# Patient Record
Sex: Female | Born: 1950
Health system: Southern US, Community
[De-identification: ages and names within clinical notes are randomized; demographics above are authoritative.]

## PROBLEM LIST (undated history)

## (undated) DIAGNOSIS — G4700 Insomnia, unspecified: Secondary | ICD-10-CM

## (undated) DIAGNOSIS — H409 Unspecified glaucoma: Secondary | ICD-10-CM

## (undated) DIAGNOSIS — Z8711 Personal history of peptic ulcer disease: Secondary | ICD-10-CM

## (undated) DIAGNOSIS — Z973 Presence of spectacles and contact lenses: Secondary | ICD-10-CM

## (undated) DIAGNOSIS — N183 Chronic kidney disease, stage 3 unspecified: Secondary | ICD-10-CM

## (undated) DIAGNOSIS — N200 Calculus of kidney: Secondary | ICD-10-CM

## (undated) DIAGNOSIS — K449 Diaphragmatic hernia without obstruction or gangrene: Secondary | ICD-10-CM

## (undated) DIAGNOSIS — I10 Essential (primary) hypertension: Secondary | ICD-10-CM

## (undated) DIAGNOSIS — I129 Hypertensive chronic kidney disease with stage 1 through stage 4 chronic kidney disease, or unspecified chronic kidney disease: Secondary | ICD-10-CM

## (undated) DIAGNOSIS — N3281 Overactive bladder: Secondary | ICD-10-CM

## (undated) DIAGNOSIS — K219 Gastro-esophageal reflux disease without esophagitis: Secondary | ICD-10-CM

## (undated) DIAGNOSIS — D509 Iron deficiency anemia, unspecified: Secondary | ICD-10-CM

## (undated) DIAGNOSIS — I671 Cerebral aneurysm, nonruptured: Secondary | ICD-10-CM

## (undated) DIAGNOSIS — N644 Mastodynia: Secondary | ICD-10-CM

## (undated) DIAGNOSIS — M810 Age-related osteoporosis without current pathological fracture: Secondary | ICD-10-CM

## (undated) DIAGNOSIS — Z972 Presence of dental prosthetic device (complete) (partial): Secondary | ICD-10-CM

## (undated) DIAGNOSIS — E039 Hypothyroidism, unspecified: Secondary | ICD-10-CM

## (undated) DIAGNOSIS — T7840XA Allergy, unspecified, initial encounter: Secondary | ICD-10-CM

## (undated) DIAGNOSIS — E079 Disorder of thyroid, unspecified: Secondary | ICD-10-CM

## (undated) DIAGNOSIS — R928 Other abnormal and inconclusive findings on diagnostic imaging of breast: Secondary | ICD-10-CM

## (undated) HISTORY — DX: Gastro-esophageal reflux disease without esophagitis: K21.9

## (undated) HISTORY — DX: Allergy, unspecified, initial encounter: T78.40XA

## (undated) HISTORY — DX: Essential (primary) hypertension: I10

## (undated) HISTORY — PX: COLONOSCOPY: SHX174

## (undated) HISTORY — DX: Disorder of thyroid, unspecified: E07.9

## (undated) HISTORY — PX: BRAIN SURGERY: SHX531

## (undated) HISTORY — DX: Age-related osteoporosis without current pathological fracture: M81.0

## (undated) HISTORY — PX: WISDOM TOOTH EXTRACTION: SHX21

---

## 1997-12-19 ENCOUNTER — Other Ambulatory Visit: Admission: RE | Admit: 1997-12-19 | Discharge: 1997-12-19 | Payer: Self-pay | Admitting: Obstetrics & Gynecology

## 1999-03-17 ENCOUNTER — Other Ambulatory Visit: Admission: RE | Admit: 1999-03-17 | Discharge: 1999-03-17 | Payer: Self-pay | Admitting: Obstetrics & Gynecology

## 1999-05-02 ENCOUNTER — Encounter: Admission: RE | Admit: 1999-05-02 | Discharge: 1999-05-02 | Payer: Self-pay | Admitting: Obstetrics & Gynecology

## 1999-05-02 ENCOUNTER — Encounter: Payer: Self-pay | Admitting: Obstetrics & Gynecology

## 2003-04-28 DIAGNOSIS — I671 Cerebral aneurysm, nonruptured: Secondary | ICD-10-CM

## 2003-04-28 HISTORY — DX: Cerebral aneurysm, nonruptured: I67.1

## 2004-03-27 DIAGNOSIS — Z8679 Personal history of other diseases of the circulatory system: Secondary | ICD-10-CM

## 2004-03-27 HISTORY — PX: CEREBRAL ANEURYSM REPAIR: SHX164

## 2004-03-27 HISTORY — DX: Personal history of other diseases of the circulatory system: Z86.79

## 2004-04-09 ENCOUNTER — Inpatient Hospital Stay (HOSPITAL_COMMUNITY): Admission: EM | Admit: 2004-04-09 | Discharge: 2004-05-01 | Payer: Self-pay | Admitting: Emergency Medicine

## 2004-05-21 ENCOUNTER — Observation Stay (HOSPITAL_COMMUNITY): Admission: EM | Admit: 2004-05-21 | Discharge: 2004-05-22 | Payer: Self-pay | Admitting: Emergency Medicine

## 2004-07-09 ENCOUNTER — Encounter: Admission: RE | Admit: 2004-07-09 | Discharge: 2004-10-07 | Payer: Self-pay | Admitting: Neurosurgery

## 2006-05-14 ENCOUNTER — Encounter: Admission: RE | Admit: 2006-05-14 | Discharge: 2006-05-14 | Payer: Self-pay | Admitting: Neurology

## 2013-04-03 ENCOUNTER — Ambulatory Visit (INDEPENDENT_AMBULATORY_CARE_PROVIDER_SITE_OTHER): Payer: Managed Care, Other (non HMO) | Admitting: Emergency Medicine

## 2013-04-03 VITALS — BP 118/76 | HR 64 | Temp 98.0°F | Resp 16 | Ht 64.0 in | Wt 202.0 lb

## 2013-04-03 DIAGNOSIS — J029 Acute pharyngitis, unspecified: Secondary | ICD-10-CM

## 2013-04-03 DIAGNOSIS — I10 Essential (primary) hypertension: Secondary | ICD-10-CM | POA: Insufficient documentation

## 2013-04-03 LAB — POCT CBC
Granulocyte percent: 68.7 %G (ref 37–80)
HCT, POC: 46.9 % (ref 37.7–47.9)
Hemoglobin: 14 g/dL (ref 12.2–16.2)
Lymph, poc: 2.2 (ref 0.6–3.4)
MCH, POC: 31.7 pg — AB (ref 27–31.2)
MCHC: 29.9 g/dL — AB (ref 31.8–35.4)
MCV: 106.1 fL — AB (ref 80–97)
MID (cbc): 0.8 (ref 0–0.9)
MPV: 9.6 fL (ref 0–99.8)
POC Granulocyte: 6.5 (ref 2–6.9)
POC LYMPH PERCENT: 22.7 %L (ref 10–50)
POC MID %: 8.6 %M (ref 0–12)
Platelet Count, POC: 264 10*3/uL (ref 142–424)
RBC: 4.42 M/uL (ref 4.04–5.48)
RDW, POC: 14.9 %
WBC: 9.5 10*3/uL (ref 4.6–10.2)

## 2013-04-03 LAB — POCT RAPID STREP A (OFFICE): Rapid Strep A Screen: NEGATIVE

## 2013-04-03 MED ORDER — FIRST-DUKES MOUTHWASH MT SUSP: OROMUCOSAL | Status: DC

## 2013-04-03 NOTE — Progress Notes (Addendum)
Subjective:    Patient ID: Rachel [name], female    DOB: 1950/10/03, 62 y.o.   MRN: [id number]  HPI This chart was scribed for Viviann Spare Lavaun Greenfield-MD, by Ladona Ridgel Day, Scribe. This patient was seen in room 12 and the patient's care was started at 12:12 PM.  HPI Comments: Rachel [name] is a 62 y.o. female who presents to the [facility] complaining of a constant, gradually worsened sore throat, onset last PM and worsened this AM when she woke up. She denies fever/chills but states that her throat feels scratchy. She reports her son at home w/similar sx last week (he had no cough/fever, just had a sore throat). She states took an advil w/out any improvement in her sx.  Medical Hx: HTN, osteoporosis, GERD She reports intake of seafood last PM (Shrimp, oysters and catfish, at [place]. Has had this many times before.), she states her sx began before eating though. She denies SOB, tongue/lip swelling.   Has been taking lisinopril for a a few years as well.  Past Medical History  Diagnosis Date  . GERD (gastroesophageal reflux disease)   . Hypertension   . Osteoporosis   . Thyroid disease    Past Surgical History  Procedure Laterality Date  . Brain surgery     Family History  Problem Relation Age of Onset  . Cancer Mother     Breast   . Hypertension Mother   . Cancer Father     Prostate  . Hypertension Father   . Cancer Sister     Breast   History   Social History  . Marital Status: Married    Spouse Name: N/A    Number of Children: N/A  . Years of Education: N/A   Occupational History  . Not on file.   Social History Main Topics  . Smoking status: Never Smoker   . Smokeless tobacco: Not on file  . Alcohol Use: No  . Drug Use: No  . Sexual Activity: Not on file   Other Topics Concern  . Not on file   Social History Narrative  . No narrative on file   Allergies  Allergen Reactions  . Dilantin [Phenytoin Sodium Extended] Rash   There are no  active problems to display for this patient.  Meds ordered this encounter  Medications  . levothyroxine (SYNTHROID, LEVOTHROID) 100 MCG tablet    Sig: Take 100 mcg by mouth daily before breakfast.  . hydrochlorothiazide (MICROZIDE) 12.5 MG capsule    Sig: Take 12.5 mg by mouth daily.  Marland Kitchen atorvastatin (LIPITOR) 20 MG tablet    Sig: Take 20 mg by mouth daily.  . pantoprazole (PROTONIX) 40 MG tablet    Sig: Take 40 mg by mouth daily.  Marland Kitchen lisinopril (PRINIVIL,ZESTRIL) 10 MG tablet    Sig: Take 10 mg by mouth daily.   Review of Systems  Constitutional: Negative for fever and chills.  HENT: Positive for sore throat. Negative for congestion and rhinorrhea.   Respiratory: Negative for cough and shortness of breath.   Cardiovascular: Negative for chest pain.  Gastrointestinal: Negative for nausea, vomiting, abdominal pain and diarrhea.  Skin: Negative for color change.   Triage Vitals: BP 118/76  Pulse 64  Temp(Src) 98 F (36.7 C) (Oral)  Resp 16  Ht 5\' 4"  (1.626 m)  Wt 202 lb (91.627 kg)  BMI 34.66 kg/m2  SpO2 94%     Objective:   Physical Exam Physical Exam  Nursing note and vitals  reviewed. Constitutional: Patient is oriented to person, place, and time. Patient appears well-developed and well-nourished. No distress.  HENT: TMs are clear nose is normal examination of the throat reveals edema present over the uvula and in the soft tissue adjacent to the uvula. The posterior pharynx appears normal. Her neck is supple without any stridor.  Head: Normocephalic and atraumatic.  Neck: Neck supple. No tracheal deviation present.  Cardiovascular: Normal rate, regular rhythm and normal heart sounds.   No murmur heard. Pulmonary/Chest: Effort normal and breath sounds normal. No respiratory distress. Patient has no wheezes. Patient has no rales.  Musculoskeletal: Normal range of motion.  Neurological: Patient is alert and oriented to person, place, and time.  Skin: Skin is warm and dry.    Psychiatric: Patient has a normal mood and affect. Patient's behavior is normal.  Results for orders placed in visit on 04/03/13  POCT RAPID STREP A (OFFICE)      Result Value Range   Rapid Strep A Screen Negative  Negative  POCT CBC      Result Value Range   WBC 9.5  4.6 - 10.2 K/uL   Lymph, poc 2.2  0.6 - 3.4   POC LYMPH PERCENT 22.7  10 - 50 %L   MID (cbc) 0.8  0 - 0.9   POC MID % 8.6  0 - 12 %M   POC Granulocyte 6.5  2 - 6.9   Granulocyte percent 68.7  37 - 80 %G   RBC 4.42  4.04 - 5.48 M/uL   Hemoglobin 14.0  12.2 - 16.2 g/dL   HCT, POC 16.1  09.6 - 47.9 %   MCV 106.1 (*) 80 - 97 fL   MCH, POC 31.7 (*) 27 - 31.2 pg   MCHC 29.9 (*) 31.8 - 35.4 g/dL   RDW, POC 04.5     Platelet Count, POC 264  142 - 424 K/uL   MPV 9.6  0 - 99.8 fL      Assessment & Plan:  The appearance of the posterior pharynx looks more allergic. She did have a seafood dinner last night. She also has had a strep exposure to her son I personally performed the services described in this documentation, which was scribed in my presence. The recorded information has been reviewed and is accurate.

## 2013-04-03 NOTE — Patient Instructions (Signed)
Take Zyrtec 10 mg one a day. Hold her lisinopril for now. Use your gargle as instructed.Sore Throat A sore throat is pain, burning, irritation, or scratchiness of the throat. There is often pain or tenderness when swallowing or talking. A sore throat may be accompanied by other symptoms, such as coughing, sneezing, fever, and swollen neck glands. A sore throat is often the first sign of another sickness, such as a cold, flu, strep throat, or mononucleosis (commonly known as mono). Most sore throats go away without medical treatment. CAUSES  The most common causes of a sore throat include:  A viral infection, such as a cold, flu, or mono.  A bacterial infection, such as strep throat, tonsillitis, or whooping cough.  Seasonal allergies.  Dryness in the air.  Irritants, such as smoke or pollution.  Gastroesophageal reflux disease (GERD). HOME CARE INSTRUCTIONS   Only take over-the-counter medicines as directed by your caregiver.  Drink enough fluids to keep your urine clear or pale yellow.  Rest as needed.  Try using throat sprays, lozenges, or sucking on hard candy to ease any pain (if older than 4 years or as directed).  Sip warm liquids, such as broth, herbal tea, or warm water with honey to relieve pain temporarily. You may also eat or drink cold or frozen liquids such as frozen ice pops.  Gargle with salt water (mix 1 tsp salt with 8 oz of water).  Do not smoke and avoid secondhand smoke.  Put a cool-mist humidifier in your bedroom at night to moisten the air. You can also turn on a hot shower and sit in the bathroom with the door closed for 5 10 minutes. SEEK IMMEDIATE MEDICAL CARE IF:  You have difficulty breathing.  You are unable to swallow fluids, soft foods, or your saliva.  You have increased swelling in the throat.  Your sore throat does not get better in 7 days.  You have nausea and vomiting.  You have a fever or persistent symptoms for more than 2 3  days.  You have a fever and your symptoms suddenly get worse. MAKE SURE YOU:   Understand these instructions.  Will watch your condition.  Will get help right away if you are not doing well or get worse. Document Released: 05/21/2004 Document Revised: 03/30/2012 Document Reviewed: 12/20/2011 [facility] Patient Information 2014 [place], [place].

## 2013-04-05 LAB — CULTURE, GROUP A STREP: Organism ID, Bacteria: NORMAL

## 2015-08-13 ENCOUNTER — Other Ambulatory Visit: Payer: Self-pay | Admitting: Internal Medicine

## 2015-08-13 DIAGNOSIS — R319 Hematuria, unspecified: Secondary | ICD-10-CM

## 2015-08-15 ENCOUNTER — Ambulatory Visit
Admission: RE | Admit: 2015-08-15 | Discharge: 2015-08-15 | Disposition: A | Discharge status: Home / Self Care | Payer: Managed Care, Other (non HMO) | Source: Ambulatory Visit | Attending: Internal Medicine | Admitting: Internal Medicine

## 2015-08-15 DIAGNOSIS — R319 Hematuria, unspecified: Secondary | ICD-10-CM

## 2015-08-19 ENCOUNTER — Other Ambulatory Visit: Payer: Self-pay | Admitting: Internal Medicine

## 2015-08-19 ENCOUNTER — Other Ambulatory Visit: Payer: Self-pay | Admitting: Radiology

## 2015-08-19 DIAGNOSIS — R319 Hematuria, unspecified: Secondary | ICD-10-CM

## 2015-08-22 ENCOUNTER — Ambulatory Visit
Admission: RE | Admit: 2015-08-22 | Discharge: 2015-08-22 | Disposition: A | Discharge status: Home / Self Care | Payer: Managed Care, Other (non HMO) | Source: Ambulatory Visit | Attending: Internal Medicine | Admitting: Internal Medicine

## 2015-08-22 DIAGNOSIS — R319 Hematuria, unspecified: Secondary | ICD-10-CM

## 2015-09-02 ENCOUNTER — Other Ambulatory Visit: Payer: Self-pay | Admitting: General Surgery

## 2015-09-02 DIAGNOSIS — R928 Other abnormal and inconclusive findings on diagnostic imaging of breast: Secondary | ICD-10-CM

## 2015-09-20 ENCOUNTER — Encounter (HOSPITAL_COMMUNITY)
Admission: RE | Admit: 2015-09-20 | Discharge: 2015-09-20 | Disposition: A | Discharge status: Home / Self Care | Payer: Managed Care, Other (non HMO) | Source: Ambulatory Visit | Attending: General Surgery | Admitting: General Surgery

## 2015-09-20 ENCOUNTER — Encounter (HOSPITAL_COMMUNITY): Payer: Self-pay

## 2015-09-20 DIAGNOSIS — R001 Bradycardia, unspecified: Secondary | ICD-10-CM | POA: Diagnosis not present

## 2015-09-20 DIAGNOSIS — I10 Essential (primary) hypertension: Secondary | ICD-10-CM | POA: Insufficient documentation

## 2015-09-20 DIAGNOSIS — Z01812 Encounter for preprocedural laboratory examination: Secondary | ICD-10-CM | POA: Diagnosis not present

## 2015-09-20 DIAGNOSIS — R928 Other abnormal and inconclusive findings on diagnostic imaging of breast: Secondary | ICD-10-CM | POA: Diagnosis not present

## 2015-09-20 DIAGNOSIS — Z01818 Encounter for other preprocedural examination: Secondary | ICD-10-CM | POA: Diagnosis present

## 2015-09-20 HISTORY — DX: Hypothyroidism, unspecified: E03.9

## 2015-09-20 LAB — CBC
HCT: 36.6 % (ref 36.0–46.0)
Hemoglobin: 11 g/dL — ABNORMAL LOW (ref 12.0–15.0)
MCH: 26.6 pg (ref 26.0–34.0)
MCHC: 30.1 g/dL (ref 30.0–36.0)
MCV: 88.6 fL (ref 78.0–100.0)
Platelets: 302 10*3/uL (ref 150–400)
RBC: 4.13 MIL/uL (ref 3.87–5.11)
RDW: 16.9 % — ABNORMAL HIGH (ref 11.5–15.5)
WBC: 6.8 10*3/uL (ref 4.0–10.5)

## 2015-09-20 LAB — BASIC METABOLIC PANEL
Anion gap: 6 (ref 5–15)
BUN: 20 mg/dL (ref 6–20)
CO2: 27 mmol/L (ref 22–32)
Calcium: 9.7 mg/dL (ref 8.9–10.3)
Chloride: 107 mmol/L (ref 101–111)
Creatinine, Ser: 1.15 mg/dL — ABNORMAL HIGH (ref 0.44–1.00)
GFR calc Af Amer: 57 mL/min — ABNORMAL LOW (ref >60)
GFR calc non Af Amer: 49 mL/min — ABNORMAL LOW (ref >60)
Glucose, Bld: 105 mg/dL — ABNORMAL HIGH (ref 65–99)
Potassium: 3.7 mmol/L (ref 3.5–5.1)
Sodium: 140 mmol/L (ref 135–145)

## 2015-09-20 NOTE — Pre-Procedure Instructions (Signed)
Rachel [name]  09/20/2015      [id number] - [place], [place] [address] Phone: [phone] FaxXO:[phone]    Your procedure is scheduled on    Thursday  09/26/15  Report to [place] at 700 A.M.  Call this number if you have problems the morning of surgery:  [phone]   Remember:  Do not eat food or drink liquids after midnight.  Take these medicines the morning of surgery with A SIP OF WATER   EYE DROPS, LEVOTHYROXINE, PANTOPRAZOLE (PROTONIX)   (STOP 7 DAYS PRIOR TO SURGERY ASPIRIN, COUMADIN, PLAVIX, EFFIENT, HERBAL MEDICINES, IBUPROFEN/ ADVIL/ MOTRIN, GOODYS, BCS)   Do not wear jewelry, make-up or nail polish.  Do not wear lotions, powders, or perfumes.  You may wear deodorant.  Do not shave 48 hours prior to surgery.  Men may shave face and neck.  Do not bring valuables to the hospital.  [facility] is not responsible for any belongings or valuables.  Contacts, dentures or bridgework may not be worn into surgery.  Leave your suitcase in the car.  After surgery it may be brought to your room.  For patients admitted to the hospital, discharge time will be determined by your treatment team.  Patients discharged the day of surgery will not be allowed to drive home.   Name and phone number of your driver:    Special instructions:  [place] - Preparing for Surgery  Before surgery, you can play an important role.  Because skin is not sterile, your skin needs to be as free of germs as possible.  You can reduce the number of germs on you skin by washing with CHG (chlorahexidine gluconate) soap before surgery.  CHG is an antiseptic cleaner which kills germs and bonds with the skin to continue killing germs even after washing.  Please DO NOT use if you have an allergy to CHG or antibacterial soaps.  If your skin becomes reddened/irritated stop using the CHG and inform your nurse when you arrive at  Short Stay.  Do not shave (including legs and underarms) for at least 48 hours prior to the first CHG shower.  You may shave your face.  Please follow these instructions carefully:   1.  Shower with CHG Soap the night before surgery and the                                morning of Surgery.  2.  If you choose to wash your hair, wash your hair first as usual with your       normal shampoo.  3.  After you shampoo, rinse your hair and body thoroughly to remove the                      Shampoo.  4.  Use CHG as you would any other liquid soap.  You can apply chg directly       to the skin and wash gently with scrungie or a clean washcloth.  5.  Apply the CHG Soap to your body ONLY FROM THE NECK DOWN.        Do not use on open wounds or open sores.  Avoid contact with your eyes,       ears, mouth and genitals (private parts).  Wash genitals (private parts)  with your normal soap.  6.  Wash thoroughly, paying special attention to the area where your surgery        will be performed.  7.  Thoroughly rinse your body with warm water from the neck down.  8.  DO NOT shower/wash with your normal soap after using and rinsing off       the CHG Soap.  9.  Pat yourself dry with a clean towel.            10.  Wear clean pajamas.            11.  Place clean sheets on your bed the night of your first shower and do not        sleep with pets.  Day of Surgery  Do not apply any lotions/deoderants the morning of surgery.  Please wear clean clothes to the hospital/surgery center.    Please read over the following fact sheets that you were given. Pain Booklet, Coughing and Deep Breathing and Surgical Site Infection Prevention

## 2015-09-20 NOTE — Progress Notes (Signed)
PATIENT DENIES ANY CARDIAC TESTING.  PCP- Glendale Chard

## 2015-09-25 MED ORDER — CEFAZOLIN SODIUM-DEXTROSE 2-4 GM/100ML-% IV SOLN
2.0000 g | INTRAVENOUS | Status: AC
  Administered 2015-09-26: 2 g via INTRAVENOUS
  Filled 2015-09-25 (×2): qty 100

## 2015-09-26 ENCOUNTER — Encounter (HOSPITAL_COMMUNITY)
Admission: RE | Disposition: A | Discharge status: Home / Self Care | Payer: Self-pay | Source: Ambulatory Visit | Attending: General Surgery

## 2015-09-26 ENCOUNTER — Ambulatory Visit (HOSPITAL_COMMUNITY): Payer: Managed Care, Other (non HMO) | Admitting: Anesthesiology

## 2015-09-26 ENCOUNTER — Encounter (HOSPITAL_COMMUNITY): Payer: Self-pay | Admitting: *Deleted

## 2015-09-26 ENCOUNTER — Ambulatory Visit (HOSPITAL_COMMUNITY)
Admission: RE | Admit: 2015-09-26 | Discharge: 2015-09-26 | Disposition: A | Discharge status: Home / Self Care | Payer: Managed Care, Other (non HMO) | Source: Ambulatory Visit | Attending: General Surgery | Admitting: General Surgery

## 2015-09-26 DIAGNOSIS — R928 Other abnormal and inconclusive findings on diagnostic imaging of breast: Secondary | ICD-10-CM | POA: Diagnosis present

## 2015-09-26 DIAGNOSIS — I10 Essential (primary) hypertension: Secondary | ICD-10-CM | POA: Insufficient documentation

## 2015-09-26 DIAGNOSIS — N6092 Unspecified benign mammary dysplasia of left breast: Secondary | ICD-10-CM | POA: Insufficient documentation

## 2015-09-26 DIAGNOSIS — Z803 Family history of malignant neoplasm of breast: Secondary | ICD-10-CM | POA: Diagnosis not present

## 2015-09-26 DIAGNOSIS — K219 Gastro-esophageal reflux disease without esophagitis: Secondary | ICD-10-CM | POA: Diagnosis not present

## 2015-09-26 DIAGNOSIS — Z87891 Personal history of nicotine dependence: Secondary | ICD-10-CM | POA: Insufficient documentation

## 2015-09-26 HISTORY — PX: RADIOACTIVE SEED GUIDED EXCISIONAL BREAST BIOPSY: SHX6490

## 2015-09-26 SURGERY — RADIOACTIVE SEED GUIDED BREAST BIOPSY
Anesthesia: General | Site: Breast | Laterality: Left

## 2015-09-26 MED ORDER — PROPOFOL 10 MG/ML IV BOLUS
INTRAVENOUS | Status: DC | PRN
  Administered 2015-09-26: 200 mg via INTRAVENOUS

## 2015-09-26 MED ORDER — 0.9 % SODIUM CHLORIDE (POUR BTL) OPTIME
TOPICAL | Status: DC | PRN
  Administered 2015-09-26: 1000 mL

## 2015-09-26 MED ORDER — ARTIFICIAL TEARS OP OINT
TOPICAL_OINTMENT | OPHTHALMIC | Status: AC
  Filled 2015-09-26: qty 3.5

## 2015-09-26 MED ORDER — PHENYLEPHRINE 40 MCG/ML (10ML) SYRINGE FOR IV PUSH (FOR BLOOD PRESSURE SUPPORT)
PREFILLED_SYRINGE | INTRAVENOUS | Status: AC
  Filled 2015-09-26: qty 10

## 2015-09-26 MED ORDER — ROCURONIUM BROMIDE 50 MG/5ML IV SOLN
INTRAVENOUS | Status: AC
  Filled 2015-09-26: qty 1

## 2015-09-26 MED ORDER — SODIUM CHLORIDE 0.9 % IJ SOLN
INTRAMUSCULAR | Status: AC
  Filled 2015-09-26: qty 10

## 2015-09-26 MED ORDER — ONDANSETRON HCL 4 MG/2ML IJ SOLN
INTRAMUSCULAR | Status: AC
  Filled 2015-09-26: qty 2

## 2015-09-26 MED ORDER — MIDAZOLAM HCL 5 MG/5ML IJ SOLN
INTRAMUSCULAR | Status: DC | PRN
  Administered 2015-09-26 (×2): 1 mg via INTRAVENOUS

## 2015-09-26 MED ORDER — MIDAZOLAM HCL 2 MG/2ML IJ SOLN
INTRAMUSCULAR | Status: AC
  Filled 2015-09-26: qty 2

## 2015-09-26 MED ORDER — FENTANYL CITRATE (PF) 100 MCG/2ML IJ SOLN
INTRAMUSCULAR | Status: DC | PRN
  Administered 2015-09-26 (×2): 25 ug via INTRAVENOUS

## 2015-09-26 MED ORDER — PROPOFOL 10 MG/ML IV BOLUS
INTRAVENOUS | Status: AC
  Filled 2015-09-26: qty 40

## 2015-09-26 MED ORDER — FENTANYL CITRATE (PF) 100 MCG/2ML IJ SOLN
25.0000 ug | INTRAMUSCULAR | Status: DC | PRN
  Administered 2015-09-26 (×2): 50 ug via INTRAVENOUS

## 2015-09-26 MED ORDER — ACETAMINOPHEN 650 MG RE SUPP: 650.0000 mg | RECTAL | Status: DC | PRN

## 2015-09-26 MED ORDER — SODIUM CHLORIDE 0.9% FLUSH: 3.0000 mL | Freq: Two times a day (BID) | INTRAVENOUS | Status: DC

## 2015-09-26 MED ORDER — PHENYLEPHRINE HCL 10 MG/ML IJ SOLN
INTRAMUSCULAR | Status: DC | PRN
  Administered 2015-09-26 (×5): 40 ug via INTRAVENOUS

## 2015-09-26 MED ORDER — OXYCODONE HCL 5 MG PO TABS: 5.0000 mg | ORAL_TABLET | Freq: Four times a day (QID) | ORAL | Status: DC | PRN

## 2015-09-26 MED ORDER — OXYCODONE HCL 5 MG PO TABS
ORAL_TABLET | ORAL | Status: AC
  Administered 2015-09-26: 10 mg via ORAL
  Filled 2015-09-26: qty 2

## 2015-09-26 MED ORDER — SODIUM CHLORIDE 0.9 % IV SOLN: 250.0000 mL | INTRAVENOUS | Status: DC | PRN

## 2015-09-26 MED ORDER — BUPIVACAINE-EPINEPHRINE (PF) 0.25% -1:200000 IJ SOLN
INTRAMUSCULAR | Status: DC | PRN
  Administered 2015-09-26: 20 mL via INTRAMUSCULAR

## 2015-09-26 MED ORDER — BUPIVACAINE-EPINEPHRINE (PF) 0.25% -1:200000 IJ SOLN
INTRAMUSCULAR | Status: AC
  Filled 2015-09-26: qty 30

## 2015-09-26 MED ORDER — SODIUM CHLORIDE 0.9% FLUSH: 3.0000 mL | INTRAVENOUS | Status: DC | PRN

## 2015-09-26 MED ORDER — ONDANSETRON HCL 4 MG/2ML IJ SOLN: 4.0000 mg | Freq: Once | INTRAMUSCULAR | Status: DC | PRN

## 2015-09-26 MED ORDER — LACTATED RINGERS IV SOLN
INTRAVENOUS | Status: DC
Start: 2015-09-26 — End: 2015-09-26
  Administered 2015-09-26: 08:00:00 via INTRAVENOUS

## 2015-09-26 MED ORDER — OXYCODONE HCL 5 MG PO TABS
5.0000 mg | ORAL_TABLET | ORAL | Status: DC | PRN
  Administered 2015-09-26: 10 mg via ORAL

## 2015-09-26 MED ORDER — FENTANYL CITRATE (PF) 100 MCG/2ML IJ SOLN
INTRAMUSCULAR | Status: AC
  Administered 2015-09-26: 50 ug via INTRAVENOUS
  Filled 2015-09-26: qty 2

## 2015-09-26 MED ORDER — HYDROCODONE-ACETAMINOPHEN 7.5-325 MG PO TABS: 1.0000 | ORAL_TABLET | Freq: Once | ORAL | Status: DC | PRN

## 2015-09-26 MED ORDER — ACETAMINOPHEN 325 MG PO TABS
ORAL_TABLET | ORAL | Status: AC
  Filled 2015-09-26: qty 2

## 2015-09-26 MED ORDER — ACETAMINOPHEN 325 MG PO TABS
650.0000 mg | ORAL_TABLET | ORAL | Status: DC | PRN
  Administered 2015-09-26: 650 mg via ORAL

## 2015-09-26 MED ORDER — FENTANYL CITRATE (PF) 250 MCG/5ML IJ SOLN
INTRAMUSCULAR | Status: AC
  Filled 2015-09-26: qty 5

## 2015-09-26 MED ORDER — LIDOCAINE HCL (PF) 1 % IJ SOLN
INTRAMUSCULAR | Status: AC
  Filled 2015-09-26: qty 30

## 2015-09-26 MED ORDER — ONDANSETRON HCL 4 MG/2ML IJ SOLN
INTRAMUSCULAR | Status: DC | PRN
  Administered 2015-09-26: 4 mg via INTRAVENOUS

## 2015-09-26 SURGICAL SUPPLY — 48 items
APPLIER CLIP 9.375 MED OPEN (MISCELLANEOUS)
APR CLP MED 9.3 20 MLT OPN (MISCELLANEOUS)
BINDER BREAST LRG (GAUZE/BANDAGES/DRESSINGS) IMPLANT
BINDER BREAST XLRG (GAUZE/BANDAGES/DRESSINGS) ×1 IMPLANT
BLADE SURG 15 STRL LF DISP TIS (BLADE) ×1 IMPLANT
BLADE SURG 15 STRL SS (BLADE) ×2
CANISTER SUCTION 2500CC (MISCELLANEOUS) ×2 IMPLANT
CHLORAPREP W/TINT 26ML (MISCELLANEOUS) ×2 IMPLANT
CLIP APPLIE 9.375 MED OPEN (MISCELLANEOUS) IMPLANT
CLIP TI LARGE 6 (CLIP) IMPLANT
COVER PROBE W GEL 5X96 (DRAPES) ×2 IMPLANT
COVER SURGICAL LIGHT HANDLE (MISCELLANEOUS) ×2 IMPLANT
DEVICE DUBIN SPECIMEN MAMMOGRA (MISCELLANEOUS) ×2 IMPLANT
DRAPE CHEST BREAST 15X10 FENES (DRAPES) ×2 IMPLANT
DRAPE UTILITY XL STRL (DRAPES) ×2 IMPLANT
DRSG PAD ABDOMINAL 8X10 ST (GAUZE/BANDAGES/DRESSINGS) ×1 IMPLANT
ELECT CAUTERY BLADE 6.4 (BLADE) ×2 IMPLANT
ELECT REM PT RETURN 9FT ADLT (ELECTROSURGICAL) ×2
ELECTRODE REM PT RTRN 9FT ADLT (ELECTROSURGICAL) ×1 IMPLANT
GLOVE BIO SURGEON STRL SZ 6 (GLOVE) ×2 IMPLANT
GLOVE BIOGEL PI IND STRL 6.5 (GLOVE) ×1 IMPLANT
GLOVE BIOGEL PI INDICATOR 6.5 (GLOVE) ×1
GOWN STRL REUS W/ TWL LRG LVL3 (GOWN DISPOSABLE) ×1 IMPLANT
GOWN STRL REUS W/TWL 2XL LVL3 (GOWN DISPOSABLE) ×2 IMPLANT
GOWN STRL REUS W/TWL LRG LVL3 (GOWN DISPOSABLE) ×2
ILLUMINATOR WAVEGUIDE N/F (MISCELLANEOUS) ×1 IMPLANT
KIT BASIN OR (CUSTOM PROCEDURE TRAY) ×2 IMPLANT
KIT MARKER MARGIN INK (KITS) ×2 IMPLANT
LIGHT WAVEGUIDE WIDE FLAT (MISCELLANEOUS) ×1 IMPLANT
LIQUID BAND (GAUZE/BANDAGES/DRESSINGS) ×1 IMPLANT
NDL HYPO 25X1 1.5 SAFETY (NEEDLE) ×1 IMPLANT
NEEDLE HYPO 25X1 1.5 SAFETY (NEEDLE) ×2 IMPLANT
NS IRRIG 1000ML POUR BTL (IV SOLUTION) ×1 IMPLANT
PACK SURGICAL SETUP 50X90 (CUSTOM PROCEDURE TRAY) ×2 IMPLANT
PENCIL BUTTON HOLSTER BLD 10FT (ELECTRODE) ×2 IMPLANT
SPONGE LAP 18X18 X RAY DECT (DISPOSABLE) ×2 IMPLANT
SUT MNCRL AB 4-0 PS2 18 (SUTURE) ×2 IMPLANT
SUT VIC AB 2-0 SH 27 (SUTURE) ×2
SUT VIC AB 2-0 SH 27XBRD (SUTURE) ×1 IMPLANT
SUT VIC AB 3-0 SH 27 (SUTURE) ×6
SUT VIC AB 3-0 SH 27X BRD (SUTURE) ×1 IMPLANT
SUT VIC AB 3-0 SH 27XBRD (SUTURE) IMPLANT
SYR BULB 3OZ (MISCELLANEOUS) ×2 IMPLANT
SYR CONTROL 10ML LL (SYRINGE) ×2 IMPLANT
TOWEL OR 17X24 6PK STRL BLUE (TOWEL DISPOSABLE) ×2 IMPLANT
TOWEL OR 17X26 10 PK STRL BLUE (TOWEL DISPOSABLE) ×2 IMPLANT
TUBE CONNECTING 12X1/4 (SUCTIONS) ×2 IMPLANT
YANKAUER SUCT BULB TIP NO VENT (SUCTIONS) ×2 IMPLANT

## 2015-09-26 NOTE — Discharge Instructions (Signed)
[facility] °Office Phone Number [phone] ° °BREAST BIOPSY/ PARTIAL MASTECTOMY: POST OP INSTRUCTIONS ° °Always review your discharge instruction sheet given to you by the facility where your surgery was performed. ° °IF YOU HAVE DISABILITY OR FAMILY LEAVE FORMS, YOU MUST BRING THEM TO THE OFFICE FOR PROCESSING.  DO NOT GIVE THEM TO YOUR DOCTOR. ° °1. A prescription for pain medication may be given to you upon discharge.  Take your pain medication as prescribed, if needed.  If narcotic pain medicine is not needed, then you may take acetaminophen (Tylenol) or ibuprofen (Advil) as needed. °2. Take your usually prescribed medications unless otherwise directed °3. If you need a refill on your pain medication, please contact your pharmacy.  They will contact our office to request authorization.  Prescriptions will not be filled after 5pm or on week-ends. °4. You should eat very light the first 24 hours after surgery, such as soup, crackers, pudding, etc.  Resume your normal diet the day after surgery. °5. Most patients will experience some swelling and bruising in the breast.  Ice packs and a good support bra will help.  Swelling and bruising can take several days to resolve.  °6. It is common to experience some constipation if taking pain medication after surgery.  Increasing fluid intake and taking a stool softener will usually help or prevent this problem from occurring.  A mild laxative (Milk of Magnesia or Miralax) should be taken according to package directions if there are no bowel movements after 48 hours. °7. Unless discharge instructions indicate otherwise, you may remove your bandages 48 hours after surgery, and you may shower at that time.  You may have steri-strips (small skin tapes) in place directly over the incision.  These strips should be left on the skin for 7-10 days.   Any sutures or staples will be removed at the office during your follow-up visit. °8. ACTIVITIES:  You may resume  regular daily activities (gradually increasing) beginning the next day.  Wearing a good support bra or sports bra (or the breast binder) minimizes pain and swelling.  You may have sexual intercourse when it is comfortable. °a. You may drive when you no longer are taking prescription pain medication, you can comfortably wear a seatbelt, and you can safely maneuver your car and apply brakes. °b. RETURN TO WORK:  __________1 week_______________ °9. You should see your doctor in the office for a follow-up appointment approximately two weeks after your surgery.  Your doctor’s nurse will typically make your follow-up appointment when she calls you with your pathology report.  Expect your pathology report 2-3 business days after your surgery.  You may call to check if you do not hear from us after three days. ° ° °WHEN TO CALL YOUR DOCTOR: °1. Fever over 101.0 °2. Nausea and/or vomiting. °3. Extreme swelling or bruising. °4. Continued bleeding from incision. °5. Increased pain, redness, or drainage from the incision. ° °The clinic staff is available to answer your questions during regular business hours.  Please don’t hesitate to call and ask to speak to one of the nurses for clinical concerns.  If you have a medical emergency, go to the nearest emergency room or call 911.  A surgeon from [facility] is always on call at the hospital. ° °For further questions, please visit [email or web address]  ° °

## 2015-09-26 NOTE — H&P (Signed)
Rachel [name] 09/02/2015 8:56 AM Location: [facility] Patient #: [id number] DOB: 17-Jun-1950 Married / Language: English / Race: Black or African American Female   History of Present Illness Stark Klein MD; 09/02/2015 9:52 AM) The patient is a 65 year old female who presents with a complaint of Breast problems. Patient is a 65 year old female referred by Dr. Marcelo Baldy for consultation regarding a new diagnosis of complex sclerosing lesion of the left breast. The patient has not ever had an abnormal mammogram before. She has a family history with her mother having breast cancer at age 55 and her sister with breast cancer at age 76. She denies breast pain, nipple discharge, change in breast contour or palpable mass.  Diagnostic mammogram and ultrasound show a 4 mm oval mass in the anterior depth in the lower inner quadrant that is hypoechoic and vascular.  Core needle biopsy shows complex sclerosing lesion.   Other Problems Elbert Ewings, CMA; 09/02/2015 8:56 AM) Gastroesophageal Reflux Disease High blood pressure Hypercholesterolemia Thyroid Disease  Past Surgical History Elbert Ewings, CMA; 09/02/2015 8:56 AM) Aneurysm Repair Breast Biopsy Left. Oral Surgery  Diagnostic Studies History Elbert Ewings, [place]; 09/02/2015 8:56 AM) Colonoscopy 1-5 years ago Mammogram within last year Pap Smear 1-5 years ago  Allergies Elbert Ewings, CMA; 09/02/2015 8:57 AM) Phenytoin *ANTICONVULSANTS*  Medication History Elbert Ewings, CMA; 09/02/2015 8:58 AM) Atorvastatin Calcium (20MG  Tablet, Oral) Active. HydroCHLOROthiazide (12.5MG  Capsule, Oral) Active. Lisinopril (10MG  Tablet, Oral) Active. Pantoprazole Sodium (40MG  Tablet DR, Oral) Active. Synthroid (100MCG Tablet, Oral) Active. Calcium (500MG  Tablet, Oral) Active. Medications Reconciled  Social History Elbert Ewings, [place]; 09/02/2015 8:56 AM) Caffeine use Coffee, Tea. No alcohol use No drug use Tobacco use Former  smoker.  Family History Elbert Ewings, [place]; 09/02/2015 8:56 AM) Breast Cancer Mother, Sister. Hypertension Father, Mother.  Pregnancy / Birth History Elbert Ewings, CMA; 09/02/2015 8:56 AM) Age at menarche 26 years. Age of menopause 51-55 Contraceptive History Oral contraceptives. Gravida 1 Maternal age 19-25 Para 1    Review of Systems Elbert Ewings CMA; 09/02/2015 8:56 AM) Skin Present- New Lesions. Not Present- Change in Wart/Mole, Dryness, Hives, Jaundice, Non-Healing Wounds, Rash and Ulcer. HEENT Not Present- Earache, Hearing Loss, Hoarseness, Nose Bleed, Oral Ulcers, Ringing in the Ears, Seasonal Allergies, Sinus Pain, Sore Throat, Visual Disturbances, Wears glasses/contact lenses and Yellow Eyes. Cardiovascular Not Present- Chest Pain, Difficulty Breathing Lying Down, Leg Cramps, Palpitations, Rapid Heart Rate, Shortness of Breath and Swelling of Extremities. Gastrointestinal Not Present- Abdominal Pain, Bloating, Bloody Stool, Change in Bowel Habits, Chronic diarrhea, Constipation, Difficulty Swallowing, Excessive gas, Gets full quickly at meals, Hemorrhoids, Indigestion, Nausea, Rectal Pain and Vomiting. Female Genitourinary Not Present- Frequency, Nocturia, Painful Urination, Pelvic Pain and Urgency. Musculoskeletal Not Present- Back Pain, Joint Pain, Joint Stiffness, Muscle Pain, Muscle Weakness and Swelling of Extremities. Neurological Not Present- Decreased Memory, Fainting, Headaches, Numbness, Seizures, Tingling, Tremor, Trouble walking and Weakness. Hematology Not Present- Easy Bruising, Excessive bleeding, Gland problems, HIV and Persistent Infections.  Vitals Elbert Ewings CMA; 09/02/2015 8:58 AM) 09/02/2015 8:58 AM Weight: 204 lb Height: 64in Body Surface Area: 1.97 m Body Mass Index: 35.02 kg/m  Temp.: 97.69F  Pulse: 60 (Regular)  BP: 128/78 (Sitting, Left Arm, Standard)       Physical Exam Stark Klein MD; 09/02/2015 9:54 AM) General Mental  Status-Alert. General Appearance-Consistent with stated age. Hydration-Well hydrated. Voice-Normal.  Head and Neck Head-normocephalic, atraumatic with no lesions or palpable masses. Trachea-midline. Thyroid Gland Characteristics - normal size and consistency.  Eye Eyeball - Bilateral-Extraocular  movements intact. Sclera/Conjunctiva - Bilateral-No scleral icterus.  Chest and Lung Exam Chest and lung exam reveals -quiet, even and easy respiratory effort with no use of accessory muscles and on auscultation, normal breath sounds, no adventitious sounds and normal vocal resonance. Inspection Chest Wall - Normal. Back - normal.  Breast Note: no palpable masses on either breast. no nipple discharge or skin dimpling. no nipple retraction.   Cardiovascular Cardiovascular examination reveals -normal heart sounds, regular rate and rhythm with no murmurs and normal pedal pulses bilaterally.  Abdomen Inspection Inspection of the abdomen reveals - No Hernias. Palpation/Percussion Palpation and Percussion of the abdomen reveal - Soft, Non Tender, No Rebound tenderness, No Rigidity (guarding) and No hepatosplenomegaly. Auscultation Auscultation of the abdomen reveals - Bowel sounds normal.  Neurologic Neurologic evaluation reveals -alert and oriented x 3 with no impairment of recent or remote memory. Mental Status-Normal.  Musculoskeletal Global Assessment -Note: no gross deformities.  Normal Exam - Left-Upper Extremity Strength Normal and Lower Extremity Strength Normal. Normal Exam - Right-Upper Extremity Strength Normal and Lower Extremity Strength Normal.  Lymphatic Head & Neck  General Head & Neck Lymphatics: Bilateral - Description - Normal. Axillary  General Axillary Region: Bilateral - Description - Normal. Tenderness - Non Tender. Femoral & Inguinal  Generalized Femoral & Inguinal Lymphatics: Bilateral - Description - No Generalized  lymphadenopathy.    Assessment & Plan Stark Klein MD; 09/02/2015 9:57 AM) ABNORMAL MAMMOGRAM OF LEFT BREAST (R92.8) Impression: Pt will need a seed localized excisional breast biopsy on the left to exclude cancer.  The procedure was explained to the patient. We discussed risk of bleeding, infection, damage to adjacent structures, pain, risk of cancer, risk of needing additional surgeries.  I reviewed post op recovery and restrictions.  The patient agrees to proceed. Current Plans You are being scheduled for surgery - Our schedulers will call you.  You should hear from our office's scheduling department within 5 working days about the location, date, and time of surgery. We try to make accommodations for patient's preferences in scheduling surgery, but sometimes the OR schedule or the surgeon's schedule prevents [place] from making those accommodations.  If you have not heard from our office [phone]) in 5 working days, call the office and ask for your surgeon's nurse.  If you have other questions about your diagnosis, plan, or surgery, call the office and ask for your surgeon's nurse.  Pt Education - [facility] Breast Biopsy HCI: discussed with patient and provided information.   Signed by Stark Klein, MD (09/02/2015 9:58 AM)

## 2015-09-26 NOTE — Anesthesia Preprocedure Evaluation (Addendum)
Anesthesia Evaluation  Patient identified by MRN, date of birth, ID band Patient awake    Reviewed: Allergy & Precautions, H&P , NPO status , Patient's Chart, lab work & pertinent test results  History of Anesthesia Complications Negative for: history of anesthetic complications  Airway Mallampati: I  TM Distance: >3 FB Neck ROM: full    Dental no notable dental hx. (+) Teeth Intact, Dental Advisory Given   Pulmonary neg pulmonary ROS, former smoker,    Pulmonary exam normal breath sounds clear to auscultation       Cardiovascular hypertension, Pt. on medications Normal cardiovascular exam Rhythm:regular Rate:Normal     Neuro/Psych 2005 had cerebral aneurysm clipping via craniotomy    GI/Hepatic negative GI ROS, Neg liver ROS, GERD  Medicated and Controlled,  Endo/Other  negative endocrine ROSHypothyroidism took thyroid rx this a.m.  Renal/GU negative Renal ROS     Musculoskeletal   Abdominal   Peds  Hematology negative hematology ROS (+)   Anesthesia Other Findings Denies cardiac symptoms or history Partial dental appliance out to husband  Reproductive/Obstetrics negative OB ROS                          Anesthesia Physical Anesthesia Plan  ASA: II  Anesthesia Plan: General   Post-op Pain Management:    Induction: Intravenous  Airway Management Planned: LMA  Additional Equipment:   Intra-op Plan:   Post-operative Plan:   Informed Consent: I have reviewed the patients History and Physical, chart, labs and discussed the procedure including the risks, benefits and alternatives for the proposed anesthesia with the patient or authorized representative who has indicated his/her understanding and acceptance.   Dental Advisory Given  Plan Discussed with: Anesthesiologist, CRNA and Surgeon  Anesthesia Plan Comments:         Anesthesia Quick Evaluation

## 2015-09-26 NOTE — Anesthesia Procedure Notes (Signed)
Procedure Name: LMA Insertion Date/Time: 09/26/2015 9:18 AM Performed by: Williemae Area B Pre-anesthesia Checklist: Patient identified, Emergency Drugs available, Suction available and Patient being monitored Patient Re-evaluated:Patient Re-evaluated prior to inductionOxygen Delivery Method: Circle system utilized Preoxygenation: Pre-oxygenation with 100% oxygen Intubation Type: IV induction Ventilation: Mask ventilation without difficulty LMA: LMA inserted LMA Size: 4.0 Number of attempts: 1 Placement Confirmation: positive ETCO2 and breath sounds checked- equal and bilateral Tube secured with: Tape (taped across cheeks) Dental Injury: Teeth and Oropharynx as per pre-operative assessment

## 2015-09-26 NOTE — Progress Notes (Signed)
Lunch relief by MA Alieah Brinton RN 

## 2015-09-26 NOTE — Interval H&P Note (Signed)
History and Physical Interval Note:  09/26/2015 8:45 AM  Rachel [name]  has presented today for surgery, with the diagnosis of abnornal mammogram  The various methods of treatment have been discussed with the patient and family. After consideration of risks, benefits and other options for treatment, the patient has consented to  Procedure(s): LEFT RADIOACTIVE SEED GUIDED EXCISIONAL BREAST BIOPSY (Left) as a surgical intervention .  The patient's history has been reviewed, patient examined, no change in status, stable for surgery.  I have reviewed the patient's chart and labs.  Questions were answered to the patient's satisfaction.     Joby Richart

## 2015-09-26 NOTE — Anesthesia Postprocedure Evaluation (Signed)
Anesthesia Post Note  Patient: Rachel [name]  Procedure(s) Performed: Procedure(s) (LRB): LEFT RADIOACTIVE SEED GUIDED EXCISIONAL BREAST BIOPSY (Left)  Patient location during evaluation: PACU Anesthesia Type: General Level of consciousness: awake and alert Pain management: pain level controlled Vital Signs Assessment: post-procedure vital signs reviewed and stable Respiratory status: spontaneous breathing, nonlabored ventilation, respiratory function stable and patient connected to nasal cannula oxygen Cardiovascular status: blood pressure returned to baseline and stable Postop Assessment: no signs of nausea or vomiting Anesthetic complications: no              Zenaida Deed

## 2015-09-26 NOTE — Op Note (Signed)
left Breast Radioactive seed localized excisional breast biopsy  Indications: This patient presents with history of abnormal mammogram and discordant biopsy  Pre-operative Diagnosis: left abnormal mammogram  Post-operative Diagnosis: Same  Surgeon: Stark Klein   Anesthesia: General endotracheal anesthesia  ASA Class: 2  Procedure Details  The patient was seen in the Holding Room. The risks, benefits, complications, treatment options, and expected outcomes were discussed with the patient. The possibilities of bleeding, infection, the need for additional procedures, failure to diagnose a condition, and creating a complication requiring transfusion or operation were discussed with the patient. The patient concurred with the proposed plan, giving informed consent.  The site of surgery properly noted/marked. The patient was taken to Operating Room # 8, identified, and the procedure verified as left Breast seed localized excisional biopsy. A Time Out was held and the above information confirmed.  The left breast and chest were prepped and draped in standard fashion. The lumpectomy was performed by creating an circumareolar incision in the quadrant of the breast over the previously placed radioactive seed.  Dissection was carried down to around the point of maximum signal intensity. The cautery was used to perform the dissection.  Hemostasis was achieved with cautery. The edges of the cavity were marked with large clips, with one each medial, lateral, inferior and superior, and two clips posteriorly.   The specimen was inked with the margin marker paint kit.    Specimen radiography confirmed inclusion of the mammographic lesion, the clip, and the seed.  The background signal in the breast was zero.  The wound was irrigated and closed with 3-0 vicryl in layers and 4-0 monocryl subcuticular suture.      Sterile dressings were applied. At the end of the operation, all sponge, instrument, and needle counts  were correct.  Findings: grossly clear surgical margins and no adenopathy  Estimated Blood Loss:  min         Specimens: left breast lumpectomy.           Complications:  None; patient tolerated the procedure well.         Disposition: PACU - hemodynamically stable.         Condition: stable

## 2015-09-26 NOTE — Transfer of Care (Signed)
Immediate Anesthesia Transfer of Care Note  Patient: Rachel [name]  Procedure(s) Performed: Procedure(s): LEFT RADIOACTIVE SEED GUIDED EXCISIONAL BREAST BIOPSY (Left)  Patient Location: PACU  Anesthesia Type:General  Level of Consciousness: awake, alert , oriented and patient cooperative  Airway & Oxygen Therapy: Patient Spontanous Breathing and Patient connected to nasal cannula oxygen  Post-op Assessment: Report given to RN, Post -op Vital signs reviewed and stable and Patient moving all extremities  Post vital signs: Reviewed and stable  Last Vitals:  Filed Vitals:   09/26/15 0715 09/26/15 1038  BP: 117/77 134/82  Pulse: 62 52  Temp: 36.4 C 36.6 C  Resp: 20 16    Last Pain:  Filed Vitals:   09/26/15 1040  PainSc: 4       Patients Stated Pain Goal: 3 ([id number])  Complications: No apparent anesthesia complications

## 2015-09-27 ENCOUNTER — Encounter (HOSPITAL_COMMUNITY): Payer: Self-pay | Admitting: General Surgery

## 2015-09-30 NOTE — Progress Notes (Signed)
Quick Note:  Please let patient know pathology is benign. ______ 

## 2015-10-03 ENCOUNTER — Other Ambulatory Visit: Payer: Self-pay | Admitting: Obstetrics and Gynecology

## 2015-10-25 ENCOUNTER — Other Ambulatory Visit: Payer: Self-pay | Admitting: Obstetrics and Gynecology

## 2015-10-29 NOTE — H&P (Addendum)
Rachel [name] is a 65 y.o. female, P: 1-0-0-1 who presents for bilateral salpingo-oophorectomy because of a large complex left ovarian cyst.  The patient was referred by her primary care physician Dr. Glendale Chard, after an evaluation for hematuria revealed a left pelvic mass.  A follow up pelvic ultrasound showed: uterus: 9.8 x 4.2 x 4.8 cm,  endometrium: 2.4 mm;  left ovary: 2.9 x 2.0 x 2.0 cm with a complex, multi-septated  ovarian cyst containing possible solid components measuring-7.5 x 4.2 x 4.9 cm. The right ovary was not visualized. The patient admits to mild but random cramping that may last for a day when it occurs followed by some bleeding noticed when she wipes her perineal area.  She goes on to report symptoms or urge incontinence and dark urine  but no dysuria or  frequency. Her bowel movements are regular and she denies any vaginitis symptoms.    Given the characteristics of her left ovarian cyst along with its size,  the patient has consented to have both ovaries removed while removing the ovarian cyst.   Past Medical History  OB History: G:1    P: 1-0-0-1;  SVB 1974  GYN History: menarche: 65 YO    LMP: menopausal    Denies history of abnormal PAP smear;   Last PAP smear: normal April 2017  Medical History: Brain Aneurysm, Colon Polyps (benign), Osteoporosis,  Hypertension, Hypercholesterolemia, Thyroid Disease and Glaucoma  Surgical History: 2005 Placement of a Brain Aneurysm Clip;  2017 Left Breast Biopsy Denies problems with anesthesia or history of blood transfusions  Family History: Scoliosis,  Hypertension and  Breast Cancer  Social History: Married and Retired;  Former Smoker and Denies Alcohol use  Medications: Synthroid 100 mcg daily Prolia 60 mg/mL as directed Pantoprazole 40 mg daily Lisinopril 10 mg daily HCTZ 10 mg  daily Combigan 0.2%-0.5%  as directed daily Atorvastatin 20 mg daily  Allergies  Allergen Reactions  . Dilantin [Phenytoin Sodium Extended]  Rash    Denies sensitivity to peanuts, shellfish, soy, latex or adhesives.    ROS: Admits to reading  glasses, occasional urge incontinence symptoms, hematuria and lower partial dental plate but denies headache, vision changes, nasal congestion, dysphagia, tinnitus, dizziness, hoarseness, cough,  chest pain, shortness of breath, nausea, vomiting, diarrhea,constipation,  urinary frequency, dysuria,  vaginitis symptoms, pelvic pain, swelling of joints,easy bruising,  myalgias, arthralgias, skin rashes, unexplained weight loss and except as is mentioned in the history of present illness, patient's review of systems is otherwise negative.  Physical Exam  Bp:  124/60     P: 82   R: 18     Temperature: 98.1 degrees F orally     Weight: 200 lbs.   Height: 5\' 4"    BMI: 34.3  Neck: supple without masses or thyromegaly Lungs: clear to auscultation Heart: regular rate and rhythm Abdomen: soft, non-tender and no organomegaly Pelvic: Patient refused Extremities:  no clubbing, cyanosis or edema   Assesment: Complex Left Ovarian Cyst   Disposition:    Reviewed the risks of surgery to include, but not limited to: reaction to anesthesia, damage to adjacent organs, infection, excessive bleeding or an open abdominal incision. The patient verbalized understanding of these risks and has consented to proceed with Laparoscopic Bilateral Salpingo-oophorectomy at [facility] on November 12, 2015.   CSN# [id number]    [place] J. Florene Glen, PA-C  for Dr. Harvie Bridge. [place]  CA-125 was wnl done by PCP.  Pt reports occas vaginal spotting  and had a neg embx.  Will f/u at post op appt.  Pt's creatinine is slightly elevated.  Will check that again in recovery room.

## 2015-10-31 NOTE — Patient Instructions (Addendum)
Your procedure is scheduled on:  Tuesday, November 12, 2015  Enter through the Main Entrance of [facility] at:  10:00 AM  Pick up the phone at the desk and dial [phone].  Call this number if you have problems the morning of surgery: [phone].  Remember: Do NOT eat food or drink after:  Midnight Monday, November 11, 2015  Take these medicines the morning of surgery with a SIP OF WATER:  Hydrochlorothiazide, Synthroid, Lisinopril, Pantoprazole,  Do NOT wear jewelry (body piercing), metal hair clips/bobby pins, make-up, or nail polish. Do NOT wear lotions, powders, or perfumes.  You may wear deodorant. Do NOT shave for 48 hours prior to surgery. Do NOT bring valuables to the hospital. Contacts, dentures, or bridgework may not be worn into surgery.  Have a responsible adult drive you home and stay with you for 24 hours after your procedure

## 2015-11-01 ENCOUNTER — Encounter (HOSPITAL_COMMUNITY)
Admission: RE | Admit: 2015-11-01 | Discharge: 2015-11-01 | Disposition: A | Discharge status: Home / Self Care | Payer: Managed Care, Other (non HMO) | Source: Ambulatory Visit | Attending: Obstetrics and Gynecology | Admitting: Obstetrics and Gynecology

## 2015-11-01 ENCOUNTER — Encounter (HOSPITAL_COMMUNITY): Payer: Self-pay

## 2015-11-01 DIAGNOSIS — Z01812 Encounter for preprocedural laboratory examination: Secondary | ICD-10-CM | POA: Insufficient documentation

## 2015-11-01 HISTORY — DX: Cerebral aneurysm, nonruptured: I67.1

## 2015-11-01 LAB — BASIC METABOLIC PANEL
Anion gap: 5 (ref 5–15)
BUN: 19 mg/dL (ref 6–20)
CO2: 26 mmol/L (ref 22–32)
Calcium: 9.6 mg/dL (ref 8.9–10.3)
Chloride: 106 mmol/L (ref 101–111)
Creatinine, Ser: 1.11 mg/dL — ABNORMAL HIGH (ref 0.44–1.00)
GFR calc Af Amer: 59 mL/min — ABNORMAL LOW (ref >60)
GFR calc non Af Amer: 51 mL/min — ABNORMAL LOW (ref >60)
Glucose, Bld: 107 mg/dL — ABNORMAL HIGH (ref 65–99)
Potassium: 4.1 mmol/L (ref 3.5–5.1)
Sodium: 137 mmol/L (ref 135–145)

## 2015-11-01 LAB — CBC
HCT: 37.1 % (ref 36.0–46.0)
Hemoglobin: 11.7 g/dL — ABNORMAL LOW (ref 12.0–15.0)
MCH: 27.6 pg (ref 26.0–34.0)
MCHC: 31.5 g/dL (ref 30.0–36.0)
MCV: 87.5 fL (ref 78.0–100.0)
Platelets: 285 10*3/uL (ref 150–400)
RBC: 4.24 MIL/uL (ref 3.87–5.11)
RDW: 17 % — ABNORMAL HIGH (ref 11.5–15.5)
WBC: 7.1 10*3/uL (ref 4.0–10.5)

## 2015-11-11 ENCOUNTER — Encounter (HOSPITAL_COMMUNITY): Payer: Self-pay | Admitting: Anesthesiology

## 2015-11-11 MED ORDER — CEFAZOLIN SODIUM-DEXTROSE 2-4 GM/100ML-% IV SOLN
2.0000 g | INTRAVENOUS | Status: AC
  Administered 2015-11-12: 2 g via INTRAVENOUS

## 2015-11-12 ENCOUNTER — Encounter (HOSPITAL_COMMUNITY)
Admission: RE | Disposition: A | Discharge status: Home / Self Care | Payer: Self-pay | Source: Ambulatory Visit | Attending: Obstetrics and Gynecology

## 2015-11-12 ENCOUNTER — Encounter (HOSPITAL_COMMUNITY): Payer: Self-pay | Admitting: *Deleted

## 2015-11-12 ENCOUNTER — Ambulatory Visit (HOSPITAL_COMMUNITY): Payer: Managed Care, Other (non HMO) | Admitting: Anesthesiology

## 2015-11-12 ENCOUNTER — Ambulatory Visit (HOSPITAL_COMMUNITY)
Admission: RE | Admit: 2015-11-12 | Discharge: 2015-11-14 | Disposition: A | Discharge status: Home / Self Care | Payer: Managed Care, Other (non HMO) | Source: Ambulatory Visit | Attending: Obstetrics and Gynecology | Admitting: Obstetrics and Gynecology

## 2015-11-12 DIAGNOSIS — J9601 Acute respiratory failure with hypoxia: Secondary | ICD-10-CM | POA: Diagnosis not present

## 2015-11-12 DIAGNOSIS — K219 Gastro-esophageal reflux disease without esophagitis: Secondary | ICD-10-CM | POA: Insufficient documentation

## 2015-11-12 DIAGNOSIS — I959 Hypotension, unspecified: Secondary | ICD-10-CM | POA: Insufficient documentation

## 2015-11-12 DIAGNOSIS — D271 Benign neoplasm of left ovary: Secondary | ICD-10-CM | POA: Diagnosis not present

## 2015-11-12 DIAGNOSIS — R0902 Hypoxemia: Secondary | ICD-10-CM

## 2015-11-12 DIAGNOSIS — N289 Disorder of kidney and ureter, unspecified: Secondary | ICD-10-CM | POA: Insufficient documentation

## 2015-11-12 DIAGNOSIS — N838 Other noninflammatory disorders of ovary, fallopian tube and broad ligament: Secondary | ICD-10-CM

## 2015-11-12 DIAGNOSIS — E039 Hypothyroidism, unspecified: Secondary | ICD-10-CM | POA: Insufficient documentation

## 2015-11-12 DIAGNOSIS — I10 Essential (primary) hypertension: Secondary | ICD-10-CM | POA: Insufficient documentation

## 2015-11-12 DIAGNOSIS — E876 Hypokalemia: Secondary | ICD-10-CM | POA: Diagnosis not present

## 2015-11-12 DIAGNOSIS — Z87891 Personal history of nicotine dependence: Secondary | ICD-10-CM | POA: Insufficient documentation

## 2015-11-12 DIAGNOSIS — Z90710 Acquired absence of both cervix and uterus: Secondary | ICD-10-CM | POA: Diagnosis present

## 2015-11-12 DIAGNOSIS — D649 Anemia, unspecified: Secondary | ICD-10-CM | POA: Insufficient documentation

## 2015-11-12 DIAGNOSIS — Z8679 Personal history of other diseases of the circulatory system: Secondary | ICD-10-CM | POA: Insufficient documentation

## 2015-11-12 DIAGNOSIS — N83202 Unspecified ovarian cyst, left side: Secondary | ICD-10-CM | POA: Diagnosis present

## 2015-11-12 HISTORY — PX: LAPAROSCOPIC BILATERAL SALPINGO OOPHERECTOMY: SHX5890

## 2015-11-12 LAB — BASIC METABOLIC PANEL
Anion gap: 7 (ref 5–15)
BUN: 19 mg/dL (ref 6–20)
CO2: 22 mmol/L (ref 22–32)
Calcium: 8.6 mg/dL — ABNORMAL LOW (ref 8.9–10.3)
Chloride: 106 mmol/L (ref 101–111)
Creatinine, Ser: 0.83 mg/dL (ref 0.44–1.00)
GFR calc Af Amer: >60 mL/min (ref >60)
GFR calc non Af Amer: >60 mL/min (ref >60)
Glucose, Bld: 138 mg/dL — ABNORMAL HIGH (ref 65–99)
Potassium: 3.7 mmol/L (ref 3.5–5.1)
Sodium: 135 mmol/L (ref 135–145)

## 2015-11-12 LAB — CBC
HCT: 38.6 % (ref 36.0–46.0)
Hemoglobin: 12.1 g/dL (ref 12.0–15.0)
MCH: 27.3 pg (ref 26.0–34.0)
MCHC: 31.3 g/dL (ref 30.0–36.0)
MCV: 86.9 fL (ref 78.0–100.0)
Platelets: 309 10*3/uL (ref 150–400)
RBC: 4.44 MIL/uL (ref 3.87–5.11)
RDW: 17.1 % — ABNORMAL HIGH (ref 11.5–15.5)
WBC: 11.5 10*3/uL — ABNORMAL HIGH (ref 4.0–10.5)

## 2015-11-12 SURGERY — SALPINGO-OOPHORECTOMY, BILATERAL, LAPAROSCOPIC
Anesthesia: General | Laterality: Bilateral

## 2015-11-12 MED ORDER — LEVOTHYROXINE SODIUM 100 MCG PO TABS
100.0000 ug | ORAL_TABLET | Freq: Every day | ORAL | Status: DC
  Filled 2015-11-12: qty 1

## 2015-11-12 MED ORDER — GLYCOPYRROLATE 0.2 MG/ML IJ SOLN
INTRAMUSCULAR | Status: AC
  Filled 2015-11-12: qty 2

## 2015-11-12 MED ORDER — HEPARIN SODIUM (PORCINE) 5000 UNIT/ML IJ SOLN
INTRAMUSCULAR | Status: AC
  Filled 2015-11-12: qty 1

## 2015-11-12 MED ORDER — LIDOCAINE HCL (CARDIAC) 20 MG/ML IV SOLN
INTRAVENOUS | Status: AC
  Filled 2015-11-12: qty 5

## 2015-11-12 MED ORDER — ROCURONIUM BROMIDE 100 MG/10ML IV SOLN
INTRAVENOUS | Status: DC | PRN
  Administered 2015-11-12: 10 mg via INTRAVENOUS
  Administered 2015-11-12: 50 mg via INTRAVENOUS

## 2015-11-12 MED ORDER — PANTOPRAZOLE SODIUM 40 MG PO TBEC
40.0000 mg | DELAYED_RELEASE_TABLET | Freq: Every day | ORAL | Status: DC
  Administered 2015-11-12 – 2015-11-14 (×3): 40 mg via ORAL
  Filled 2015-11-12 (×3): qty 1

## 2015-11-12 MED ORDER — DEXAMETHASONE SODIUM PHOSPHATE 4 MG/ML IJ SOLN
INTRAMUSCULAR | Status: DC | PRN
  Administered 2015-11-12: 8 mg via INTRAVENOUS

## 2015-11-12 MED ORDER — LACTATED RINGERS IR SOLN
Status: DC | PRN
  Administered 2015-11-12: 3000 mL

## 2015-11-12 MED ORDER — NEOSTIGMINE METHYLSULFATE 10 MG/10ML IV SOLN
INTRAVENOUS | Status: DC | PRN
  Administered 2015-11-12: 2 mg via INTRAVENOUS

## 2015-11-12 MED ORDER — FENTANYL CITRATE (PF) 100 MCG/2ML IJ SOLN: 25.0000 ug | INTRAMUSCULAR | Status: DC | PRN

## 2015-11-12 MED ORDER — SODIUM CHLORIDE 0.9 % IJ SOLN
INTRAMUSCULAR | Status: AC
  Filled 2015-11-12: qty 10

## 2015-11-12 MED ORDER — LACTATED RINGERS IV SOLN
INTRAVENOUS | Status: DC
  Administered 2015-11-12 (×4): via INTRAVENOUS

## 2015-11-12 MED ORDER — DEXAMETHASONE SODIUM PHOSPHATE 10 MG/ML IJ SOLN
INTRAMUSCULAR | Status: AC
  Filled 2015-11-12: qty 1

## 2015-11-12 MED ORDER — EPHEDRINE SULFATE 50 MG/ML IJ SOLN
INTRAMUSCULAR | Status: DC | PRN
  Administered 2015-11-12 (×3): 5 mg via INTRAVENOUS

## 2015-11-12 MED ORDER — NEOSTIGMINE METHYLSULFATE 10 MG/10ML IV SOLN
INTRAVENOUS | Status: AC
  Filled 2015-11-12: qty 1

## 2015-11-12 MED ORDER — ONDANSETRON HCL 4 MG/2ML IJ SOLN
INTRAMUSCULAR | Status: AC
  Filled 2015-11-12: qty 2

## 2015-11-12 MED ORDER — HYDROMORPHONE HCL 1 MG/ML IJ SOLN
INTRAMUSCULAR | Status: DC | PRN
  Administered 2015-11-12 (×2): .2 mg via INTRAVENOUS

## 2015-11-12 MED ORDER — LISINOPRIL 10 MG PO TABS
10.0000 mg | ORAL_TABLET | Freq: Every day | ORAL | Status: DC
  Filled 2015-11-12 (×2): qty 1

## 2015-11-12 MED ORDER — GLYCOPYRROLATE 0.2 MG/ML IJ SOLN
INTRAMUSCULAR | Status: DC | PRN
  Administered 2015-11-12: .1 mg via INTRAVENOUS
  Administered 2015-11-12: 0.4 mg via INTRAVENOUS
  Administered 2015-11-12: 0.1 mg via INTRAVENOUS

## 2015-11-12 MED ORDER — FENTANYL CITRATE (PF) 250 MCG/5ML IJ SOLN
INTRAMUSCULAR | Status: AC
  Filled 2015-11-12: qty 5

## 2015-11-12 MED ORDER — BUPIVACAINE HCL (PF) 0.25 % IJ SOLN
INTRAMUSCULAR | Status: AC
  Filled 2015-11-12: qty 30

## 2015-11-12 MED ORDER — OXYCODONE-ACETAMINOPHEN 5-325 MG PO TABS
1.0000 | ORAL_TABLET | ORAL | Status: DC | PRN
  Administered 2015-11-13 – 2015-11-14 (×2): 1 via ORAL
  Filled 2015-11-12 (×2): qty 1

## 2015-11-12 MED ORDER — LACTATED RINGERS IV SOLN
INTRAVENOUS | Status: DC
  Administered 2015-11-13: 14:00:00 via INTRAVENOUS

## 2015-11-12 MED ORDER — MIDAZOLAM HCL 2 MG/2ML IJ SOLN
INTRAMUSCULAR | Status: AC
  Filled 2015-11-12: qty 2

## 2015-11-12 MED ORDER — HYDROCODONE-ACETAMINOPHEN 5-325 MG PO TABS: ORAL_TABLET | ORAL | Status: DC

## 2015-11-12 MED ORDER — PHENYLEPHRINE HCL 10 MG/ML IJ SOLN
INTRAMUSCULAR | Status: DC | PRN
  Administered 2015-11-12 (×2): 40 ug via INTRAVENOUS
  Administered 2015-11-12 (×2): 80 ug via INTRAVENOUS

## 2015-11-12 MED ORDER — ONDANSETRON HCL 4 MG/2ML IJ SOLN
INTRAMUSCULAR | Status: DC | PRN
  Administered 2015-11-12: 4 mg via INTRAVENOUS

## 2015-11-12 MED ORDER — FENTANYL CITRATE (PF) 250 MCG/5ML IJ SOLN
INTRAMUSCULAR | Status: DC | PRN
  Administered 2015-11-12: 25 ug via INTRAVENOUS
  Administered 2015-11-12 (×2): 50 ug via INTRAVENOUS
  Administered 2015-11-12 (×4): 25 ug via INTRAVENOUS

## 2015-11-12 MED ORDER — MIDAZOLAM HCL 2 MG/2ML IJ SOLN
INTRAMUSCULAR | Status: DC | PRN
  Administered 2015-11-12: 1 mg via INTRAVENOUS

## 2015-11-12 MED ORDER — PROPOFOL 10 MG/ML IV BOLUS
INTRAVENOUS | Status: AC
  Filled 2015-11-12: qty 20

## 2015-11-12 MED ORDER — LIDOCAINE HCL (CARDIAC) 20 MG/ML IV SOLN
INTRAVENOUS | Status: DC | PRN
  Administered 2015-11-12: 100 mg via INTRAVENOUS

## 2015-11-12 MED ORDER — GLYCOPYRROLATE 0.2 MG/ML IJ SOLN
INTRAMUSCULAR | Status: AC
  Filled 2015-11-12: qty 1

## 2015-11-12 MED ORDER — ROCURONIUM BROMIDE 100 MG/10ML IV SOLN
INTRAVENOUS | Status: AC
  Filled 2015-11-12: qty 1

## 2015-11-12 MED ORDER — HYDROMORPHONE HCL 1 MG/ML IJ SOLN
INTRAMUSCULAR | Status: AC
  Filled 2015-11-12: qty 1

## 2015-11-12 MED ORDER — BUPIVACAINE HCL (PF) 0.25 % IJ SOLN
INTRAMUSCULAR | Status: DC | PRN
Start: 2015-11-12 — End: 2015-11-12
  Administered 2015-11-12: 12 mL

## 2015-11-12 MED ORDER — PROMETHAZINE HCL 25 MG/ML IJ SOLN: 6.2500 mg | INTRAMUSCULAR | Status: DC | PRN

## 2015-11-12 MED ORDER — PROPOFOL 10 MG/ML IV BOLUS
INTRAVENOUS | Status: DC | PRN
  Administered 2015-11-12: 100 mg via INTRAVENOUS

## 2015-11-12 MED ORDER — DEXAMETHASONE SODIUM PHOSPHATE 4 MG/ML IJ SOLN
INTRAMUSCULAR | Status: AC
  Filled 2015-11-12: qty 1

## 2015-11-12 MED ORDER — IBUPROFEN 600 MG PO TABS
600.0000 mg | ORAL_TABLET | Freq: Four times a day (QID) | ORAL | Status: DC | PRN
  Administered 2015-11-13 – 2015-11-14 (×2): 600 mg via ORAL
  Filled 2015-11-12 (×2): qty 1

## 2015-11-12 MED ORDER — HYDROCHLOROTHIAZIDE 12.5 MG PO CAPS
12.5000 mg | ORAL_CAPSULE | Freq: Every day | ORAL | Status: DC
  Filled 2015-11-12 (×2): qty 1

## 2015-11-12 SURGICAL SUPPLY — 37 items
BAG SPEC RTRVL LRG 6X4 10 (ENDOMECHANICALS) ×2
CABLE HIGH FREQUENCY MONO STRZ (ELECTRODE) IMPLANT
CLOTH BEACON ORANGE TIMEOUT ST (SAFETY) ×2 IMPLANT
CONTAINER PREFILL 10% NBF 60ML (FORM) ×2 IMPLANT
DRSG COVADERM PLUS 2X2 (GAUZE/BANDAGES/DRESSINGS) ×3 IMPLANT
DRSG OPSITE POSTOP 3X4 (GAUZE/BANDAGES/DRESSINGS) ×1 IMPLANT
DURAPREP 26ML APPLICATOR (WOUND CARE) ×2 IMPLANT
FORCEPS CUTTING 33CM 5MM (CUTTING FORCEPS) ×1 IMPLANT
FORCEPS CUTTING 45CM 5MM (CUTTING FORCEPS) IMPLANT
GLOVE BIO SURGEON STRL SZ7.5 (GLOVE) ×2 IMPLANT
GLOVE BIOGEL PI IND STRL 7.0 (GLOVE) ×1 IMPLANT
GLOVE BIOGEL PI IND STRL 7.5 (GLOVE) ×1 IMPLANT
GLOVE BIOGEL PI INDICATOR 7.0 (GLOVE) ×3
GLOVE BIOGEL PI INDICATOR 7.5 (GLOVE) ×1
GOWN STRL REUS W/TWL LRG LVL3 (GOWN DISPOSABLE) ×6 IMPLANT
LIQUID BAND (GAUZE/BANDAGES/DRESSINGS) ×2 IMPLANT
NEEDLE INSUFFLATION 120MM (ENDOMECHANICALS) ×1 IMPLANT
NS IRRIG 1000ML POUR BTL (IV SOLUTION) ×1 IMPLANT
PACK LAPAROSCOPY BASIN (CUSTOM PROCEDURE TRAY) ×2 IMPLANT
PAD TRENDELENBURG POSITION (MISCELLANEOUS) ×2 IMPLANT
POUCH LAPAROSCOPIC INSTRUMENT (MISCELLANEOUS) ×1 IMPLANT
POUCH SPECIMEN RETRIEVAL 10MM (ENDOMECHANICALS) ×2 IMPLANT
SET IRRIG TUBING LAPAROSCOPIC (IRRIGATION / IRRIGATOR) ×1 IMPLANT
SLEEVE XCEL OPT CAN 5 100 (ENDOMECHANICALS) ×1 IMPLANT
SOLUTION ELECTROLUBE (MISCELLANEOUS) IMPLANT
SUT MNCRL AB 3-0 PS2 27 (SUTURE) ×2 IMPLANT
SUT VICRYL 0 ENDOLOOP (SUTURE) IMPLANT
SUT VICRYL 0 TIES 12 18 (SUTURE) IMPLANT
SUT VICRYL 0 UR6 27IN ABS (SUTURE) ×3 IMPLANT
SYR 50ML LL SCALE MARK (SYRINGE) ×1 IMPLANT
TOWEL OR 17X24 6PK STRL BLUE (TOWEL DISPOSABLE) ×4 IMPLANT
TRAY FOLEY CATH SILVER 14FR (SET/KITS/TRAYS/PACK) ×2 IMPLANT
TROCAR XCEL NON-BLD 11X100MML (ENDOMECHANICALS) ×3 IMPLANT
TROCAR XCEL NON-BLD 5MMX100MML (ENDOMECHANICALS) ×2 IMPLANT
TUBING INSUFFLATION 10FT LAP (TUBING) ×1 IMPLANT
WARMER LAPAROSCOPE (MISCELLANEOUS) ×2 IMPLANT
WATER STERILE IRR 1000ML POUR (IV SOLUTION) ×1 IMPLANT

## 2015-11-12 NOTE — Op Note (Addendum)
Preop Diagnosis: Left Ovarian Mass  Postop Diagnosis: Left Ovarian Mass  Procedure: LAPAROSCOPIC BILATERAL SALPINGO-OOPHORECTOMY  Anesthesia: General   Attending: [name], MD   Assistant:  Earnstine Regal, PA-C  Findings: Large left multicystic ovarian mass , nl appearing right ovary and nl appearing bilateral fallopian tubes  Pathology: Bilateral tubes and ovaries in separate containers, cyst fluid and pelvic washings  Fluids: 2500 cc  UOP: 350 cc  EBL: 50 cc  Complications: None  Procedure:The patient was taken to the operating room after the risks, benefits, alternatives, complications, treatment options, and expected outcomes were discussed with the patient. The patient verbalized understanding, the patient concurred with the proposed plan and consent signed and witnessed. The patient was taken to the Operating Room, identified as Rachel [name] and procedure verified as sterilization procedure via laparoscopic bilateral salpingectomy. A Time Out was held and the above information confirmed.  The patient was placed under general anesthesia per anesthesia staff, the patient was placed in modified dorsal lithotomy position and was prepped, draped, and catheterized in the normal, sterile fashion.  The cervix was visualized and an intrauterine manipulator was placed. A  10 mm umbilical incision was then performed. Veress needle was passed and pneumoperitoneum was established. An 11 mm trocar was advanced into the intraabdominal cavity, the laparoscope was introduced and findings as noted above.  An 76mm incision was made suprapubically and 44mm trocar advanced into the intraabdominal cavity under direct visualization.  A 59mm incision was made in the LLQ and 50mm trocar advanced into the intraabdominal cavity.  The left ureter was identified and nl peristalsis was noted.   The left ovarian mass was identified and grasped along with the left fallopian tube after carrying it out  to its fimbriated end and with the Gyrus tripolar, the infundibulopelvic ligament and uteroovarian ligament were cauterized and cut along with the isthmic portion of fallopian tube after decompressing ovary with clear fluid noted.  The remainder of the tissue of the mesosalpinx and adjacent to ovary was cauterized and cut.  The specimen was placed in an endopouch and decompressed further and removed through 24mm suprapubic incision in pieces.  There was a brief moment when the kocher went through endopouch and an epiplioc was grasped with the tip of the kocher.  No bowel injury was noted and there was minimal oozing.  The area was irrigated and no active bleeding noted.  The same was done on the contralateral side except no cyst was present on that side and the ovary was cut in half and removed through the 15mm suprapubic trocar without difficulty.  Copious irrigation was performed until fluid was clear.  The 58mm suprapubic trocar was removed under direct visualization and fascia repaired with 0 vicryl.  Pneumoperitoneum was attempted to be obtained again but could never get good distension but the 5mm trocars were visualized and removed under direct visualization.  The pneumoperitoneum was released and umbilical trocar removed under direct visualization.  The fascia was repaired with an interrupted stitch of 0 vicryl and the 10 mm incisions were reapproximated with 3-0 monocryl via a subcuticular stitch.  Liquaband was applied to close the 65mm incisions and used to reinforce the 33mm incisions as well.  Sponge, lap and needle count were correct.  Patient tolerated the procedure well and was returned to the recovery room in stable condition.   PACU nurse called me at about 6:45p stating that pt was unable to be adequately weaned from oxygen.  Decision was made to  admit the patient overnight for observation and will attempt weaning in the morning.

## 2015-11-12 NOTE — Anesthesia Procedure Notes (Signed)
Procedure Name: Intubation Date/Time: 11/12/2015 11:39 AM Performed by: Flossie Dibble Pre-anesthesia Checklist: Patient identified, Emergency Drugs available, Suction available, Patient being monitored and Timeout performed Patient Re-evaluated:Patient Re-evaluated prior to inductionOxygen Delivery Method: Circle system utilized Preoxygenation: Pre-oxygenation with 100% oxygen Intubation Type: IV induction Ventilation: Mask ventilation without difficulty Laryngoscope Size: Mac and 3 Grade View: Grade I Tube size: 7.0 mm Number of attempts: 1 Airway Equipment and Method: Stylet Placement Confirmation: ETT inserted through vocal cords under direct vision,  positive ETCO2 and breath sounds checked- equal and bilateral Secured at: 21 cm Tube secured with: Tape Dental Injury: Teeth and Oropharynx as per pre-operative assessment

## 2015-11-12 NOTE — Transfer of Care (Signed)
Immediate Anesthesia Transfer of Care Note  Patient: XANDER SEMAAN  Procedure(s) Performed: Procedure(s): LAPAROSCOPIC BILATERAL SALPINGO OOPHORECTOMY (Bilateral)  Patient Location: PACU  Anesthesia Type:General  Level of Consciousness: awake, alert  and oriented  Airway & Oxygen Therapy: Patient Spontanous Breathing and Patient connected to face mask oxygen  Post-op Assessment: Report given to RN and Post -op Vital signs reviewed and stable  Post vital signs: Reviewed and stable  Last Vitals:  Filed Vitals:   11/12/15 1018  BP: 110/84  Pulse: 64  Temp: 36.7 C  Resp: 16    Last Pain: There were no vitals filed for this visit.    Patients Stated Pain Goal: 3 ([id number])  Complications: No apparent anesthesia complications

## 2015-11-12 NOTE — Anesthesia Preprocedure Evaluation (Signed)
Anesthesia Evaluation  Patient identified by MRN, date of birth, ID band Patient awake    Reviewed: Allergy & Precautions, H&P , NPO status , Patient's Chart, lab work & pertinent test results  History of Anesthesia Complications Negative for: history of anesthetic complications  Airway Mallampati: I  TM Distance: >3 FB Neck ROM: full    Dental no notable dental hx. (+) Teeth Intact, Dental Advisory Given   Pulmonary neg pulmonary ROS, former smoker,    Pulmonary exam normal breath sounds clear to auscultation       Cardiovascular hypertension, Pt. on medications Normal cardiovascular exam Rhythm:regular Rate:Normal     Neuro/Psych 2005 had cerebral aneurysm clipping via craniotomy    GI/Hepatic negative GI ROS, Neg liver ROS, GERD  Medicated and Controlled,  Endo/Other  negative endocrine ROSHypothyroidism took thyroid rx this a.m.  Renal/GU negative Renal ROS     Musculoskeletal   Abdominal   Peds  Hematology negative hematology ROS (+)   Anesthesia Other Findings Denies cardiac symptoms or history Partial dental appliance out to husband  Reproductive/Obstetrics negative OB ROS                             Anesthesia Physical  Anesthesia Plan  ASA: II  Anesthesia Plan: General   Post-op Pain Management:    Induction: Intravenous  Airway Management Planned: Oral ETT  Additional Equipment:   Intra-op Plan:   Post-operative Plan:   Informed Consent: I have reviewed the patients History and Physical, chart, labs and discussed the procedure including the risks, benefits and alternatives for the proposed anesthesia with the patient or authorized representative who has indicated his/her understanding and acceptance.   Dental Advisory Given  Plan Discussed with: Anesthesiologist, CRNA and Surgeon  Anesthesia Plan Comments:         Anesthesia Quick Evaluation

## 2015-11-12 NOTE — Interval H&P Note (Signed)
History and Physical Interval Note:  11/12/2015 11:18 AM  Rachel [name]  has presented today for surgery, with the diagnosis of Left Ovarian Cyst  The various methods of treatment have been discussed with the patient and family. After consideration of risks, benefits and other options for treatment, the patient has consented to  Procedure(s): LAPAROSCOPIC BILATERAL SALPINGO OOPHORECTOMY (Bilateral) POSSIBLE LAPAROTOMY as a surgical intervention .  The patient's history has been reviewed, patient examined, no change in status, stable for surgery.  I have reviewed the patient's chart and labs.  Questions were answered to the patient's satisfaction.     [name]

## 2015-11-12 NOTE — Anesthesia Postprocedure Evaluation (Signed)
Anesthesia Post Note  Patient: Rachel [name]  Procedure(s) Performed: Procedure(s) (LRB): LAPAROSCOPIC BILATERAL SALPINGO OOPHORECTOMY (Bilateral)  Patient location during evaluation: PACU Anesthesia Type: General Level of consciousness: awake and alert Pain management: pain level controlled Vital Signs Assessment: post-procedure vital signs reviewed and stable Respiratory status: spontaneous breathing, nonlabored ventilation, respiratory function stable and patient connected to nasal cannula oxygen Cardiovascular status: blood pressure returned to baseline and stable Postop Assessment: no signs of nausea or vomiting Anesthetic complications: no     Last Vitals:  Filed Vitals:   11/12/15 1018  BP: 110/84  Pulse: 64  Temp: 36.7 C  Resp: 16    Last Pain: There were no vitals filed for this visit. Pain Goal: Patients Stated Pain Goal: 3 (11/12/15 1018)               Zenaida Deed

## 2015-11-13 ENCOUNTER — Ambulatory Visit (HOSPITAL_COMMUNITY): Payer: Managed Care, Other (non HMO)

## 2015-11-13 DIAGNOSIS — I959 Hypotension, unspecified: Secondary | ICD-10-CM

## 2015-11-13 DIAGNOSIS — R0902 Hypoxemia: Secondary | ICD-10-CM | POA: Diagnosis not present

## 2015-11-13 DIAGNOSIS — D271 Benign neoplasm of left ovary: Secondary | ICD-10-CM | POA: Diagnosis not present

## 2015-11-13 LAB — CBC WITH DIFFERENTIAL/PLATELET
Basophils Absolute: 0 10*3/uL (ref 0.0–0.1)
Basophils Relative: 0 %
Eosinophils Absolute: 0 10*3/uL (ref 0.0–0.7)
Eosinophils Relative: 0 %
HCT: 32.8 % — ABNORMAL LOW (ref 36.0–46.0)
Hemoglobin: 10.5 g/dL — ABNORMAL LOW (ref 12.0–15.0)
Lymphocytes Relative: 14 %
Lymphs Abs: 1.4 10*3/uL (ref 0.7–4.0)
MCH: 27.4 pg (ref 26.0–34.0)
MCHC: 32 g/dL (ref 30.0–36.0)
MCV: 85.6 fL (ref 78.0–100.0)
Monocytes Absolute: 0.4 10*3/uL (ref 0.1–1.0)
Monocytes Relative: 4 %
Neutro Abs: 8.2 10*3/uL — ABNORMAL HIGH (ref 1.7–7.7)
Neutrophils Relative %: 82 %
Platelets: 295 10*3/uL (ref 150–400)
RBC: 3.83 MIL/uL — ABNORMAL LOW (ref 3.87–5.11)
RDW: 16.9 % — ABNORMAL HIGH (ref 11.5–15.5)
WBC: 10 10*3/uL (ref 4.0–10.5)

## 2015-11-13 MED ORDER — LEVOTHYROXINE SODIUM 88 MCG PO TABS
88.0000 ug | ORAL_TABLET | Freq: Every day | ORAL | Status: DC
  Administered 2015-11-13 – 2015-11-14 (×2): 88 ug via ORAL
  Filled 2015-11-13 (×3): qty 1

## 2015-11-13 NOTE — Anesthesia Postprocedure Evaluation (Signed)
Anesthesia Post Note  Patient: HEATHR SCHWERIN  Procedure(s) Performed: Procedure(s) (LRB): LAPAROSCOPIC BILATERAL SALPINGO OOPHORECTOMY (Bilateral)  Patient location during evaluation: [facility] Anesthesia Type: General Level of consciousness: awake, awake and alert, oriented and patient cooperative Pain management: pain level controlled Vital Signs Assessment: post-procedure vital signs reviewed and stable Respiratory status: spontaneous breathing, patient connected to nasal cannula oxygen, respiratory function stable and nonlabored ventilation Cardiovascular status: stable Postop Assessment: no signs of nausea or vomiting and adequate PO intake Anesthetic complications: no     Last Vitals:  Filed Vitals:   11/13/15 0121 11/13/15 0559  BP: 100/67 104/71  Pulse: 100 102  Temp: 37 C 37.3 C  Resp: 16 18    Last Pain:  Filed Vitals:   11/13/15 0600  PainSc: 1    Pain Goal: Patients Stated Pain Goal: 3 (11/13/15 0020)               Deannie Resetar C

## 2015-11-13 NOTE — Progress Notes (Signed)
Rachel [name] is a35 y.o.  [id number]  Post Op Date # Laparoscopic BSO  Subjective: Patient is Doing well postoperatively. Patient has The patient is not having any pain. Reports only soreness.  Ambulating in halls without difficulty,  voiding and has ordered regular breakfast (previously was only allowed clear liquids).  Denies any chest pain or shortness of breath.  States that her spouse says that she snores but she "isn't sure about that".  Gives no history of respiratory issues.   Objective: Vital signs in last 24 hours: Temp:  [97.8 F (36.6 C)-99.2 F (37.3 C)] 99.2 F (37.3 C) (07/19 0559) Pulse Rate:  [55-102] 102 (07/19 0559) Resp:  [10-18] 18 (07/19 0559) BP: (100-128)/(64-97) 104/71 mmHg (07/19 0559) SpO2:  [87 %-100 %] 96 % (07/19 0559)  Intake/Output from previous day: 07/18 0701 - 07/19 0700 In: 3666.3 [P.O.:360; I.V.:3306.3] Out: 1225 [Urine:1175] Intake/Output this shift: Total I/O In: -  Out: 100 [Urine:100]  Recent Labs Lab 11/12/15 2020  WBC 11.5*  HGB 12.1  HCT 38.6  PLT 309     Recent Labs Lab 11/12/15 1618  NA 135  K 3.7  CL 106  CO2 22  BUN 19  CREATININE 0.83  CALCIUM 8.6*  GLUCOSE 138*    EXAM: General: alert, cooperative and no distress Resp: clear to auscultation bilaterally Cardio: regular rate and rhythm, S1, S2 normal, no murmur, click, rub or gallop GI: Bowel sounds present, soft Extremities: Homans sign is negative, no sign of DVT and no calf tenderness.   Assessment: s/p Procedure(s): LAPAROSCOPIC BILATERAL SALPINGO OOPHORECTOMY: stable and progressing well  Plan: Advance diet Will begin to wean off of oxygen  Routine care per Dr. Mancel Bale  LOS: 1 day    POWELL,ELMIRA, PA-C 11/13/2015 7:48 AM  PT with hypotension and hypoxia.  Pulmonary consulted.  I spoke with Dr. Ashok Cordia.  Consult appreciated.

## 2015-11-13 NOTE — Consult Note (Signed)
Name: Rachel [name] MRN: [id number] DOB: 17-Sep-1950    ADMISSION DATE:  11/12/2015 CONSULTATION DATE:  11/13/15  REFERRING MD :  Dr. Mancel Bale   CHIEF COMPLAINT:  Shortness of breath / Hypoxia    HISTORY OF PRESENT ILLNESS:  65 y/o F former smoker (quit 2005 after aneurysm, 1ppd) with PMH of GERD, HTN, brain aneurysm s/p surgical repair admitted to [facility] hospital 7/18 for bilateral salpingo-oophorectomy in the setting of a large complex L ovarian cyst.   Post operatively she was noted to have mild hypotension and new requirement of supplemental O2 for mild hypoxia.  Op-note reflects 50 ml EBL and 2500 ml fluids.  Admit Hgb >12, post-op 10.5.   Pulmonary consulted to assist.   Notable labs-- WBC 10, Hgb 10.5, Afebrile.    Pt currently without complaints. Denies chest pain, chest pressure, hemoptysis, leg/calf pain or swelling, dizziness, n/v/d. She denies known history of sleep apnea or COPD.  At baseline, she is fully independent of all ADL's. 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   PAST MEDICAL HISTORY :   has a past medical history of GERD (gastroesophageal reflux disease); Hypertension; Osteoporosis; Thyroid disease; Hypothyroidism; and Brain aneurysm (2005).  has past surgical history that includes Brain surgery; Radioactive seed guided excisional breast biopsy (Left, 09/26/2015); Colonoscopy; and Wisdom tooth extraction.   Prior to Admission medications   Medication Sig Start Date End Date Taking? Authorizing Provider  acetaminophen (TYLENOL) 500 MG tablet Take 1,000 mg by mouth every 6 (six) hours as needed for headache.   Yes Historical Provider, MD  atorvastatin (LIPITOR) 20 MG tablet Take 20 mg by mouth daily.   Yes Historical Provider, MD  calcium citrate (CALCITRATE - DOSED IN MG ELEMENTAL CALCIUM) 950 MG tablet Take 200 mg of elemental calcium by mouth daily.   Yes Historical Provider, MD  Cholecalciferol (VITAMIN D3) 5000  units CAPS Take 5,000 Units by mouth daily.    Yes Historical Provider, MD  COMBIGAN 0.2-0.5 % ophthalmic solution Place 1 drop into the right eye 2 (two) times daily. 08/16/15  Yes Historical Provider, MD  hydrochlorothiazide (MICROZIDE) 12.5 MG capsule Take 12.5 mg by mouth daily.   Yes Historical Provider, MD  levothyroxine (SYNTHROID, LEVOTHROID) 100 MCG tablet Take 100 mcg by mouth daily before breakfast.   Yes Historical Provider, MD  lisinopril (PRINIVIL,ZESTRIL) 10 MG tablet Take 10 mg by mouth daily.   Yes Historical Provider, MD  pantoprazole (PROTONIX) 40 MG tablet Take 40 mg by mouth daily.   Yes Historical Provider, MD  HYDROcodone-acetaminophen (NORCO/VICODIN) 5-325 MG tablet 1  po every 6 hours as needed for pain 11/12/15   Earnstine Regal, PA-C  oxyCODONE (OXY IR/ROXICODONE) 5 MG immediate release tablet Take 1-2 tablets (5-10 mg total) by mouth every 6 (six) hours as needed for moderate pain, severe pain or breakthrough pain. Patient not taking: Reported on 10/29/2015 09/26/15   Stark Klein, MD   Allergies  Allergen Reactions  . Dilantin [Phenytoin Sodium Extended] Rash    FAMILY HISTORY:  family history includes Cancer in her father, mother, and sister; Hypertension in her father and mother.   SOCIAL HISTORY:  reports that she quit smoking about 11 years ago. Her smoking use included Cigarettes. She has a 33 pack-year smoking history. She has never used smokeless tobacco. She reports that she does not drink alcohol or use illicit drugs.  REVIEW OF SYSTEMS:  POSITIVES IN BOLD Gen: Denies fever, chills,  weight change, fatigue, night sweats HEENT: Denies blurred vision, double vision, hearing loss, tinnitus, sinus congestion, rhinorrhea, sore throat, neck stiffness, dysphagia PULM: Denies shortness of breath, cough, sputum production, hemoptysis, wheezing. Reports staff noted decreased O2 sats CV: Denies chest pain, edema, orthopnea, paroxysmal nocturnal dyspnea, palpitations GI:  Denies abdominal pain, nausea, vomiting, diarrhea, hematochezia, melena, constipation, change in bowel habits GU: Denies dysuria, hematuria, polyuria, oliguria, urethral discharge Endocrine: Denies hot or cold intolerance, polyuria, polyphagia or appetite change Derm: Denies rash, dry skin, scaling or peeling skin change Heme: Denies easy bruising, bleeding, bleeding gums Neuro: Denies headache, numbness, weakness, slurred speech, loss of memory or consciousness   SUBJECTIVE:   VITAL SIGNS: Temp:  [97.8 F (36.6 C)-99.2 F (37.3 C)] 98.1 F (36.7 C) (07/19 1403) Pulse Rate:  [55-112] 85 (07/19 1403) Resp:  [10-18] 18 (07/19 0559) BP: (81-128)/(51-97) 87/55 mmHg (07/19 1332) SpO2:  [87 %-100 %] 93 % (07/19 1403)  PHYSICAL EXAMINATION: General:  Chronically ill appearing female, NAD lying in bed Neuro:  Awake, alert, appropriate, MAE  HEENT:  MM moist, no JVD  Cardiovascular:  s1s2 rrr Lungs:  resps even non-labored, slightly diminished bases otherwise clear, pt able to pull 1500 - 1750 on IS without difficulty Abdomen:  Round, soft, lap sites c/d/i Musculoskeletal:  Warm and dry, no edema    Recent Labs Lab 11/12/15 1618  NA 135  K 3.7  CL 106  CO2 22  BUN 19  CREATININE 0.83  GLUCOSE 138*    Recent Labs Lab 11/12/15 2020 11/13/15 0805  HGB 12.1 10.5*  HCT 38.6 32.8*  WBC 11.5* 10.0  PLT 309 295   No results found.   SIGNIFICANT EVENTS  7/18  Admit for bilateral salpingo-oophorectomy, pathology pending   STUDIES:  CXR 7/19 >>   ASSESSMENT / PLAN:  DISCUSSION: 65 y.o. F admitted 7/18 with large complex left ovarian cyst, admitted 7/18 for bilateral salpingo-oophorectomy. Procedure performed successfully; however, day after procedure, she developed hypotension (SBP in 80's) and hypoxia (93% on 2L). PCCM was called in consultation.  ASSESSMENT / PLAN:  PULMONARY A: Acute hypoxic respiratory failure - suspect primarily due to post-op atelectasis.   Denies calf pain, swelling, chest pain.  Former Smoker - question if degree of underlying COPD.  No hx of PFT assessment.   P:  Continue supplemental O2 as needed to maintain SpO2 > 92%. Assess CXR now. Pulmonary hygiene - encouraged IS, reviewed how to use with patient at bedside  Mobilize as able  CARDIOVASCULAR A:  Hypotension - likely hypovolemic + small component of blood loss. Hx HTN, HLD. P:  Continue IVF's, reduce to 50 ml/hr Hold outpatient Microzide, Lisinopril for now - consider restart in AM if pressures normalize.  RENAL A:  No acute issues. P:  Follow BMP and correct electrolytes as indicated.  GASTROINTESTINAL A:  GERD. P:  Continue outpatient PPI.  HEMATOLOGIC A:  Anemia - suspect component of dilution with 2500 ml fluids in OR + IVF's post op, 50 ml blood loss VTE Prophylaxis. P:  Transfuse for Hgb < 7. SCD's. CBC in AM.  ENDOCRINE A:  Hypothyroidism. P:  Continue outpatient synthroid.  NEUROLOGIC A:  Hx brain aneurysm - s/p surgery 2005 (Dr. Christella Noa). P:  No interventions required.  Rachel Gens, NP-C [facility] Pgr: [phone] or if no answer [phone] 11/13/2015, 2:30 PM   Attending Note:  I have examined patient, reviewed labs, studies and notes. I have discussed the case with Rachel [name],  and I agree with the data and plans as amended above. 65 yo woman with 25 pk-yr tobacco hx, POD #1 s/p BSO. She has been noted to have post-op hypotension as well as hypoxemia. On eval she is awake, interacting comfortably. Her breath sounds are decreased at the Rachel bases. BP has now normalized. I suspect that her hypoxemia is related to atelectasis and possibly some volume overload superimposed on prior smoking hx. She has never been rx for COPD before - not clear that she needs to be. Nothing to support PE at this time. Transient hypotension likely related to volume shifts and re-initiation of her home a-HTN regimen. We  will decrease IVF, hold her BP meds. Push IS and pulmonary hygiene. If she remains hypoxemic then we will expand her workup, consider CT-PA, PFT's.    Baltazar Apo, MD, PhD 11/13/2015, 3:15 PM [facility] [phone] or if no answer [phone]

## 2015-11-14 DIAGNOSIS — D271 Benign neoplasm of left ovary: Secondary | ICD-10-CM | POA: Diagnosis not present

## 2015-11-14 DIAGNOSIS — I959 Hypotension, unspecified: Secondary | ICD-10-CM | POA: Diagnosis not present

## 2015-11-14 DIAGNOSIS — R0902 Hypoxemia: Secondary | ICD-10-CM | POA: Diagnosis not present

## 2015-11-14 LAB — BASIC METABOLIC PANEL
Anion gap: 5 (ref 5–15)
BUN: 20 mg/dL (ref 6–20)
CO2: 25 mmol/L (ref 22–32)
Calcium: 8.2 mg/dL — ABNORMAL LOW (ref 8.9–10.3)
Chloride: 108 mmol/L (ref 101–111)
Creatinine, Ser: 1.09 mg/dL — ABNORMAL HIGH (ref 0.44–1.00)
GFR calc Af Amer: >60 mL/min (ref >60)
GFR calc non Af Amer: 52 mL/min — ABNORMAL LOW (ref >60)
Glucose, Bld: 85 mg/dL (ref 65–99)
Potassium: 3.3 mmol/L — ABNORMAL LOW (ref 3.5–5.1)
Sodium: 138 mmol/L (ref 135–145)

## 2015-11-14 LAB — CBC WITH DIFFERENTIAL/PLATELET
Basophils Absolute: 0 10*3/uL (ref 0.0–0.1)
Basophils Relative: 0 %
Eosinophils Absolute: 0.1 10*3/uL (ref 0.0–0.7)
Eosinophils Relative: 1 %
HCT: 30 % — ABNORMAL LOW (ref 36.0–46.0)
Hemoglobin: 9.8 g/dL — ABNORMAL LOW (ref 12.0–15.0)
Lymphocytes Relative: 39 %
Lymphs Abs: 3.1 10*3/uL (ref 0.7–4.0)
MCH: 28.3 pg (ref 26.0–34.0)
MCHC: 32.7 g/dL (ref 30.0–36.0)
MCV: 86.7 fL (ref 78.0–100.0)
Monocytes Absolute: 0.4 10*3/uL (ref 0.1–1.0)
Monocytes Relative: 6 %
Neutro Abs: 4.3 10*3/uL (ref 1.7–7.7)
Neutrophils Relative %: 54 %
Platelets: 269 10*3/uL (ref 150–400)
RBC: 3.46 MIL/uL — ABNORMAL LOW (ref 3.87–5.11)
RDW: 17.1 % — ABNORMAL HIGH (ref 11.5–15.5)
WBC: 7.9 10*3/uL (ref 4.0–10.5)

## 2015-11-14 MED ORDER — OXYCODONE-ACETAMINOPHEN 5-325 MG PO TABS: 1.0000 | ORAL_TABLET | ORAL | Status: DC | PRN

## 2015-11-14 MED ORDER — OXYCODONE-ACETAMINOPHEN 5-325 MG PO TABS: 1.0000 | ORAL_TABLET | Freq: Four times a day (QID) | ORAL | Status: DC | PRN

## 2015-11-14 MED ORDER — POTASSIUM CHLORIDE CRYS ER 20 MEQ PO TBCR
20.0000 meq | EXTENDED_RELEASE_TABLET | Freq: Once | ORAL | Status: DC
  Filled 2015-11-14: qty 1

## 2015-11-14 NOTE — Discharge Summary (Signed)
Physician Discharge Summary  Patient ID: SAE EUTSLER MRN: [id number] DOB/AGE: 64-Jun-1952 64 y.o.  Admit date: 11/12/2015 Discharge date: 11/14/2015   Discharge Diagnoses:  Left Ovarian Mass and Post Operative Hypoxia Active Problems: S/p Laparoscopic BSO   Operation: Laparoscopic Bilateral Salpingo-oophorectomy   Discharged Condition: Good  Hospital Course: Pt underwent Lap BSO and in the recovery room was unable to be weaned from her oxygen.  She was admitted and pulmonary consult obtained.  She gradually improved but pulmonologist ultimately thought patient likely had underlying disease, possibly COPD.  Outpatient SA study and PFTs were recommended.  The patient's BP also was low although she tolerated it fine.  She denied any light headedness or dizziness.  BP meds were held and pt instructed to come to office tomorrow for BP check and repeat cbc and bmet.  Pt was informed to not take BP meds until instructed to restart.  Pulmonary recommended transfusing for Hgb less than 7.  Her discharge Hgb was 9.8 and preop Hgb 11.7.  Her WBC had returned to nl.  Her resting O2 sat was 93%.  Pt otherwise feels back to her normal self.  She is ambulating without difficulty, passing flatus and tolerating regular diet.    Disposition: 01-Home or Self Care  Discharge Medications:    Medication List    STOP taking these medications        acetaminophen 500 MG tablet  Commonly known as:  TYLENOL     oxyCODONE 5 MG immediate release tablet  Commonly known as:  Oxy IR/ROXICODONE      TAKE these medications        atorvastatin 20 MG tablet  Commonly known as:  LIPITOR  Take 20 mg by mouth daily.     calcium citrate 950 MG tablet  Commonly known as:  CALCITRATE - dosed in mg elemental calcium  Take 200 mg of elemental calcium by mouth daily.     COMBIGAN 0.2-0.5 % ophthalmic solution  Generic drug:  brimonidine-timolol  Place 1 drop into the right eye 2 (two) times daily.     hydrochlorothiazide 12.5 MG capsule  Commonly known as:  MICROZIDE  Take 12.5 mg by mouth daily.     HYDROcodone-acetaminophen 5-325 MG tablet  Commonly known as:  NORCO/VICODIN  1  po every 6 hours as needed for pain     levothyroxine 100 MCG tablet  Commonly known as:  SYNTHROID, LEVOTHROID  Take 100 mcg by mouth daily before breakfast.     lisinopril 10 MG tablet  Commonly known as:  PRINIVIL,ZESTRIL  Take 10 mg by mouth daily.     oxyCODONE-acetaminophen 5-325 MG tablet  Commonly known as:  PERCOCET/ROXICET  Take 1-2 tablets by mouth every 4 (four) hours as needed for severe pain (moderate to severe pain (when tolerating fluids)).     pantoprazole 40 MG tablet  Commonly known as:  PROTONIX  Take 40 mg by mouth daily.     Vitamin D3 5000 units Caps  Take 5,000 Units by mouth daily.          Follow-up: Dr. Harvie Bridge. Mancel Bale on November 27, 2015 at 2:15 p.m.     SignedEarnstine Regal, PA-C 11/14/2015, 8:29 AM

## 2015-11-14 NOTE — Discharge Instructions (Signed)
Call [facility] @ [phone] if:  You have a temperature greater than or equal to 100.4 degrees Farenheit orally You have pain that is not made better by the pain medication given and taken as directed You have excessive bleeding or problems urinating  Take Colace (Docusate Sodium/Stool Softener) 100 mg 2-3 times daily while taking narcotic pain medicine to avoid constipation or until bowel movements are regular.   You may drive after 48 hours You may walk up stairs  You may shower   You may resume a regular diet Keep incisions clean and dry   Avoid anything in vagina for until after your post-operative visit Keep follow up appointment with Dr. Mancel Bale on November 27, 2015 at  2:15 p.m.

## 2015-11-14 NOTE — Progress Notes (Addendum)
Name: Rachel [name] MRN: [id number] DOB: 1951/02/27    ADMISSION DATE:  11/12/2015 CONSULTATION DATE:  11/13/15  REFERRING MD :  Dr. Mancel Bale   CHIEF COMPLAINT:  Shortness of breath / Hypoxia    HISTORY OF PRESENT ILLNESS:  65 y/o F former smoker (quit 2005 after aneurysm, 1ppd) with PMH of GERD, HTN, brain aneurysm s/p surgical repair admitted to [facility] hospital 7/18 for bilateral salpingo-oophorectomy in the setting of a large complex L ovarian cyst.   Post operatively she was noted to have mild hypotension and new requirement of supplemental O2 for mild hypoxia.  Op-note reflects 50 ml EBL and 2500 ml fluids.  Admit Hgb >12, post-op 10.5.   Pulmonary consulted to assist.   Notable labs-- WBC 10, Hgb 10.5, Afebrile.    Pt currently without complaints. Denies chest pain, chest pressure, hemoptysis, leg/calf pain or swelling, dizziness, n/v/d. She denies known history of sleep apnea or COPD.  At baseline, she is fully independent of all ADL's. 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   SUBJECTIVE:  O2 weaned to 1L/min last 18 hours Comfortable, had a good night Doing IS reliably   VITAL SIGNS: Temp:  [98.1 F (36.7 C)-98.3 F (36.8 C)] 98.2 F (36.8 C) (07/20 0636) Pulse Rate:  [68-112] 77 (07/20 0636) Resp:  [16-18] 18 (07/20 0636) BP: (81-102)/(51-76) 102/63 mmHg (07/20 0636) SpO2:  [87 %-98 %] 94 % (07/20 0636)  PHYSICAL EXAMINATION: General:  Chronically ill appearing female, NAD lying in bed Neuro:  Awake, alert, appropriate, MAE  HEENT:  MM moist, no JVD  Cardiovascular:  s1s2 rrr Lungs:  resps even non-labored, slightly diminished bases otherwise clear Abdomen:  Round, soft, lap sites c/d/i Musculoskeletal:  Warm and dry, no edema    Recent Labs Lab 11/12/15 1618 11/14/15 0527  NA 135 138  K 3.7 3.3*  CL 106 108  CO2 22 25  BUN 19 20  CREATININE 0.83 1.09*  GLUCOSE 138* 85    Recent Labs Lab  11/12/15 2020 11/13/15 0805 11/14/15 0527  HGB 12.1 10.5* 9.8*  HCT 38.6 32.8* 30.0*  WBC 11.5* 10.0 7.9  PLT 309 295 269   Dg Chest 2 View  11/13/2015  CLINICAL DATA:  Postop bilateral salpingo oophorectomy 11/12/2015 with hypoxia. EXAM: CHEST  2 VIEW COMPARISON:  04/09/2004 FINDINGS: Lungs are adequately inflated with mild opacification over the left base and minimal blunting of the costophrenic angles. Borderline cardiomegaly unchanged. Mild calcified plaque over the aortic arch. Bones and soft tissues are within normal IMPRESSION: Mild left base opacification which may be due to atelectasis versus infection. Suggestion of a small amount of bilateral pleural fluid versus pleural parenchymal scarring. Electronically Signed   By: Marin Olp M.D.   On: 11/13/2015 16:04     SIGNIFICANT EVENTS  7/18  Admit for bilateral salpingo-oophorectomy, pathology pending   STUDIES:  CXR 7/19 >> L basilar atelectasis   ASSESSMENT / PLAN:  DISCUSSION: 65 y.o. F admitted 7/18 with large complex left ovarian cyst, admitted 7/18 for bilateral salpingo-oophorectomy. Procedure performed successfully; however, day after procedure, she developed hypotension (SBP in 80's) and hypoxia (93% on 2L). Appears to be improving in both respects. Suspect she needed some volume resuscitation and temporarily holding her home HTN meds. Her hypoxemia is likely due to atx, seems to be resolving with IS and mobilization.   ASSESSMENT / PLAN:  PULMONARY A: Acute hypoxic respiratory failure - suspect primarily due  to post-op atelectasis. CXR from 7/19 supports this.  Denies calf pain, swelling, chest pain.  Former Smoker - question if degree of underlying COPD.  No hx of PFT assessment.   Snoring. Consider risk for OSA given this + her hypoxemia P:  Goal wean O2 to off 7/20 Pulmonary hygiene - encouraged IS, reviewed how to use with patient at bedside  Mobilize as able Consider PFT as an outpatient if dyspnea (at  some risk for COPD given smoking hx; no clinical hx to push for testing at this time) Consider outpt PSG to evaluate for OSA given her snoring and her risk for hypoxemia identified during this hospitalization. I asked her to contact me if she would like to work up possible OSA  CARDIOVASCULAR A:  Hypotension - likely hypovolemic + small component of blood loss. Resolved Hx HTN, HLD. P:  D/c IVF   Hold outpatient Microzide, Lisinopril for now; can likely restart in next 24h as BP and renal fxn return to her usual baseline.   RENAL A:  Mild acute renal insufficiency, suspect due to transient renal hypoperfusion, should resolve Hypokalemia P:  Follow BMP and correct electrolytes as indicated. Continue to hold ACE-I and HCTZ for now, restart once BP and renal fxn return to her baseline Careful with ibuprofen given slight increase S Cr  GASTROINTESTINAL A:  GERD. P:  Continue outpatient PPI.  HEMATOLOGIC A:  Anemia - suspect component of dilution with 2500 ml fluids in OR + IVF's post op, 50 ml blood loss VTE Prophylaxis. P:  Transfuse for Hgb < 7. (12.1 >> 9.8 on 7/20)  SCD's.  ENDOCRINE A:  Hypothyroidism. P:  Continue outpatient synthroid.  NEUROLOGIC A:  Hx brain aneurysm - s/p surgery 2005 (Dr. Christella Noa). P:  No interventions required.  Suspect we will be able to wean O2 off today. Probably OK to restart her BP regimen on 7/20 or 7/21.  Consider outpt eval for OSA if the patient has refractory nocturnal hypoxemia and is interested in pursuing Would also consider outpt PFT if she develops dyspnea Please call us if we can assist further  Baltazar Apo, MD, PhD 11/14/2015, 7:57 AM [facility] [phone] or if no answer [phone]

## 2015-11-14 NOTE — Progress Notes (Signed)
SAMIRA LOMBOY is a25 y.o.  [id number]  Post Op Date # 3:  Laparoscopic Bilateral Salpingo-oophorectomy/Hypoxia  Subjective: Patient is Doing well postoperatively. Patient has The patient is not having any pain. Ambulating in the halls without difficulty, tolerating a regular diet, has passed flatus and voiding without difficulty.  Has no breathing difficulties in spite of episode of hypoxia and no chest pain.   Objective: Vital signs in last 24 hours: Temp:  [98.1 F (36.7 C)-98.3 F (36.8 C)] 98.2 F (36.8 C) (07/20 0636) Pulse Rate:  [68-112] 77 (07/20 0636) Resp:  [16-18] 18 (07/20 0636) BP: (81-102)/(51-76) 102/63 mmHg (07/20 0636) SpO2:  [87 %-98 %] 94 % (07/20 0636)  Intake/Output from previous day: 07/19 0701 - 07/20 0700 In: 680 [P.O.:680] Out: 900 [Urine:900] Intake/Output this shift:    Recent Labs Lab 11/12/15 2020 11/13/15 0805 11/14/15 0527  WBC 11.5* 10.0 7.9  HGB 12.1 10.5* 9.8*  HCT 38.6 32.8* 30.0*  PLT 309 295 269     Recent Labs Lab 11/12/15 1618 11/14/15 0527  NA 135 138  K 3.7 3.3*  CL 106 108  CO2 22 25  BUN 19 20  CREATININE 0.83 1.09*  CALCIUM 8.6* 8.2*  GLUCOSE 138* 85    EXAM: General: alert, cooperative and no distress Resp: clear to auscultation bilaterally Cardio: regular rate and rhythm, S1, S2 normal, no murmur, click, rub or gallop GI: Bowel sounds present, soft; Incisons without evidence of infection. Extremities: Homans sign is negative, no sign of DVT and no calf tenderness.   Assessment: s/p Procedure(s): LAPAROSCOPIC BILATERAL SALPINGO OOPHORECTOMY: stable, progressing well, tolerating diet and anemia  Hypoxia-resolving  Plan: Discharge home  Per Pulmonology Evaluation, may need outpatient PFTs and evaluation for OSA  LOS: 2 days    Consuela Widener, PA-C 11/14/2015 8:32 AM

## 2015-11-14 NOTE — Progress Notes (Signed)
Pt called at home to be informed that DR ROBERTS want her to take a 20 meq  Potassium pill x1

## 2015-11-14 NOTE — Progress Notes (Signed)
Pt out in wheelchair  Teaching complete   

## 2015-11-15 ENCOUNTER — Encounter (HOSPITAL_COMMUNITY): Payer: Self-pay | Admitting: Obstetrics and Gynecology

## 2015-11-18 ENCOUNTER — Inpatient Hospital Stay (HOSPITAL_COMMUNITY): Payer: Managed Care, Other (non HMO)

## 2015-11-18 ENCOUNTER — Encounter (HOSPITAL_COMMUNITY): Payer: Self-pay | Admitting: Anesthesiology

## 2015-11-18 ENCOUNTER — Encounter (HOSPITAL_COMMUNITY): Payer: Self-pay | Admitting: Radiology

## 2015-11-18 ENCOUNTER — Inpatient Hospital Stay (HOSPITAL_COMMUNITY)
Admission: AD | Admit: 2015-11-18 | Discharge: 2015-11-26 | DRG: 354 | Disposition: A | Discharge status: Home / Self Care | Payer: Managed Care, Other (non HMO) | Source: Ambulatory Visit | Attending: Obstetrics and Gynecology | Admitting: Obstetrics and Gynecology

## 2015-11-18 DIAGNOSIS — K913 Postprocedural intestinal obstruction: Principal | ICD-10-CM | POA: Diagnosis present

## 2015-11-18 DIAGNOSIS — E878 Other disorders of electrolyte and fluid balance, not elsewhere classified: Secondary | ICD-10-CM | POA: Diagnosis present

## 2015-11-18 DIAGNOSIS — K56609 Unspecified intestinal obstruction, unspecified as to partial versus complete obstruction: Secondary | ICD-10-CM | POA: Diagnosis present

## 2015-11-18 DIAGNOSIS — R1110 Vomiting, unspecified: Secondary | ICD-10-CM

## 2015-11-18 DIAGNOSIS — I10 Essential (primary) hypertension: Secondary | ICD-10-CM | POA: Diagnosis present

## 2015-11-18 DIAGNOSIS — K219 Gastro-esophageal reflux disease without esophagitis: Secondary | ICD-10-CM | POA: Diagnosis present

## 2015-11-18 DIAGNOSIS — E039 Hypothyroidism, unspecified: Secondary | ICD-10-CM | POA: Diagnosis present

## 2015-11-18 DIAGNOSIS — Z9889 Other specified postprocedural states: Secondary | ICD-10-CM

## 2015-11-18 DIAGNOSIS — M810 Age-related osteoporosis without current pathological fracture: Secondary | ICD-10-CM | POA: Diagnosis present

## 2015-11-18 DIAGNOSIS — Z87891 Personal history of nicotine dependence: Secondary | ICD-10-CM

## 2015-11-18 DIAGNOSIS — K430 Incisional hernia with obstruction, without gangrene: Secondary | ICD-10-CM | POA: Diagnosis present

## 2015-11-18 DIAGNOSIS — R112 Nausea with vomiting, unspecified: Secondary | ICD-10-CM

## 2015-11-18 DIAGNOSIS — E876 Hypokalemia: Secondary | ICD-10-CM | POA: Diagnosis present

## 2015-11-18 MED ORDER — DIATRIZOATE MEGLUMINE & SODIUM 66-10 % PO SOLN
30.0000 mL | Freq: Once | ORAL | Status: AC
  Administered 2015-11-18: 30 mL via ORAL
  Filled 2015-11-18: qty 30

## 2015-11-18 MED ORDER — METOCLOPRAMIDE HCL 5 MG/ML IJ SOLN
10.0000 mg | Freq: Three times a day (TID) | INTRAMUSCULAR | Status: DC
  Administered 2015-11-18 – 2015-11-20 (×5): 10 mg via INTRAVENOUS
  Filled 2015-11-18 (×5): qty 2

## 2015-11-18 MED ORDER — LACTATED RINGERS IV SOLN
INTRAVENOUS | Status: DC
  Administered 2015-11-18 – 2015-11-19 (×3): via INTRAVENOUS

## 2015-11-18 MED ORDER — METOCLOPRAMIDE HCL 5 MG/ML IJ SOLN: 10.0000 mg | Freq: Three times a day (TID) | INTRAMUSCULAR | Status: DC

## 2015-11-18 MED ORDER — IOPAMIDOL (ISOVUE-300) INJECTION 61%
100.0000 mL | Freq: Once | INTRAVENOUS | Status: AC | PRN
  Administered 2015-11-18: 80 mL via INTRAVENOUS

## 2015-11-18 NOTE — H&P (Addendum)
Rachel [name] is an 65 y.o. female. Pt is s/p lap BSO for serous cystadenoma and was normotensive on discharge so BP meds were held.  I had the pt f/u in the office for BP check and today she mentioned she has been vomiting since Thursday and no appetite and no food intake for the last 2 days.  Her husband says she has been vomiting since Friday evening.  She reports a normal BM on Sat and Sun.  She denies any abdominal pain.  Pertinent Gynecological History: S/p Lap BSO     Past Medical History:  Diagnosis Date  . Brain aneurysm 2005  . GERD (gastroesophageal reflux disease)   . Hypertension   . Hypothyroidism   . Osteoporosis   . Thyroid disease     Past Surgical History:  Procedure Laterality Date  . BRAIN SURGERY     2005    DR CABBELL   . COLONOSCOPY    . LAPAROSCOPIC BILATERAL SALPINGO OOPHERECTOMY Bilateral 11/12/2015   Procedure: LAPAROSCOPIC BILATERAL SALPINGO OOPHORECTOMY;  Surgeon: Everett Graff, MD;  Location: [facility];  Service: Gynecology;  Laterality: Bilateral;  . RADIOACTIVE SEED GUIDED EXCISIONAL BREAST BIOPSY Left 09/26/2015   Procedure: LEFT RADIOACTIVE SEED GUIDED EXCISIONAL BREAST BIOPSY;  Surgeon: Stark Klein, MD;  Location: [facility];  Service: General;  Laterality: Left;  . WISDOM TOOTH EXTRACTION      Family History  Problem Relation Age of Onset  . Cancer Mother     Breast   . Hypertension Mother   . Cancer Father     Prostate  . Hypertension Father   . Cancer Sister     Breast    Social History:  reports that she quit smoking about 11 years ago. Her smoking use included Cigarettes. She has a 33.00 pack-year smoking history. She has never used smokeless tobacco. She reports that she does not drink alcohol or use drugs.  Allergies:  Allergies  Allergen Reactions  . Dilantin [Phenytoin Sodium Extended] Rash    Prescriptions Prior to Admission  Medication Sig Dispense Refill Last Dose  . atorvastatin (LIPITOR) 20 MG tablet Take 20 mg by mouth  daily.   11/11/2015 at Unknown time  . calcium citrate (CALCITRATE - DOSED IN MG ELEMENTAL CALCIUM) 950 MG tablet Take 200 mg of elemental calcium by mouth daily.   11/11/2015 at Unknown time  . Cholecalciferol (VITAMIN D3) 5000 units CAPS Take 5,000 Units by mouth daily.    11/11/2015 at Unknown time  . COMBIGAN 0.2-0.5 % ophthalmic solution Place 1 drop into the right eye 2 (two) times daily.  6 11/11/2015 at Unknown time  . hydrochlorothiazide (MICROZIDE) 12.5 MG capsule Take 12.5 mg by mouth daily.   11/12/2015 at 0800  . HYDROcodone-acetaminophen (NORCO/VICODIN) 5-325 MG tablet 1  po every 6 hours as needed for pain 12 tablet 0   . levothyroxine (SYNTHROID, LEVOTHROID) 100 MCG tablet Take 100 mcg by mouth daily before breakfast.   11/12/2015 at 0800  . lisinopril (PRINIVIL,ZESTRIL) 10 MG tablet Take 10 mg by mouth daily.   11/12/2015 at 0800  . oxyCODONE-acetaminophen (PERCOCET/ROXICET) 5-325 MG tablet Take 1 tablet by mouth every 6 (six) hours as needed for severe pain (moderate to severe pain (when tolerating fluids)). 30 tablet 0   . pantoprazole (PROTONIX) 40 MG tablet Take 40 mg by mouth daily.   11/11/2015 at Unknown time    ROS Denies Fever, Chills,  Blood pressure 133/82, pulse 73, temperature 98.2 F (36.8 C), temperature source Oral,  resp. rate 18. Physical Exam Lungs CTA CV RRR Abdomen +BS, possibly hyperactive, softly minimally distended, no rebound or guarding  CBC in office Hgb 12.0 WBC 14.7, absolute neutrophils high at greater than 10,000 and relative neutrophils wnl BMET creatinine 1.29 otherwise wnl CT Scan ordered  Assessment/Plan: S/p Lap BSO on 7/18 secondary to serous cystadenoma.  Dr. Joylene John called and reported that CT Scan looks like a SBO.  Will keep NPO.  Cont reglan and IVF.  General Surgery Dr. Leighton Ruff  [name] 11/18/2015, 6:32 PM

## 2015-11-18 NOTE — Consult Note (Signed)
Reason for Consult:post operative nausea and vomiting, abnormal imaging Referring Physician: Dr Octavio Manns  Rachel [name] is an 65 y.o. female.  HPI: Pt is 1 week s/p Lap BSO.  Developed nausea and vomiting over the weekend.  She has been unable to tolerate PO at home.  Had a BM both Sat and Sun.  CT scan today concerning for obstruction vs ileus.  Past Medical History:  Diagnosis Date  . Brain aneurysm 2005  . GERD (gastroesophageal reflux disease)   . Hypertension   . Hypothyroidism   . Osteoporosis   . Thyroid disease     Past Surgical History:  Procedure Laterality Date  . BRAIN SURGERY     2005    DR CABBELL   . COLONOSCOPY    . LAPAROSCOPIC BILATERAL SALPINGO OOPHERECTOMY Bilateral 11/12/2015   Procedure: LAPAROSCOPIC BILATERAL SALPINGO OOPHORECTOMY;  Surgeon: Everett Graff, MD;  Location: [facility];  Service: Gynecology;  Laterality: Bilateral;  . RADIOACTIVE SEED GUIDED EXCISIONAL BREAST BIOPSY Left 09/26/2015   Procedure: LEFT RADIOACTIVE SEED GUIDED EXCISIONAL BREAST BIOPSY;  Surgeon: Stark Klein, MD;  Location: [facility];  Service: General;  Laterality: Left;  . WISDOM TOOTH EXTRACTION      Family History  Problem Relation Age of Onset  . Cancer Mother     Breast   . Hypertension Mother   . Cancer Father     Prostate  . Hypertension Father   . Cancer Sister     Breast    Social History:  reports that she quit smoking about 11 years ago. Her smoking use included Cigarettes. She has a 33.00 pack-year smoking history. She has never used smokeless tobacco. She reports that she does not drink alcohol or use drugs.  Allergies:  Allergies  Allergen Reactions  . Dilantin [Phenytoin Sodium Extended] Rash    Medications: I have reviewed the patient's current medications.  No results found for this or any previous visit (from the past 48 hour(s)).  Ct Abdomen Pelvis W Contrast  Result Date: 11/18/2015 CLINICAL DATA:  Status post laparoscopic pelvic GYN surgery. Nausea  and vomiting. Postop day 6. EXAM: CT ABDOMEN AND PELVIS WITH CONTRAST TECHNIQUE: Multidetector CT imaging of the abdomen and pelvis was performed using the standard protocol following bolus administration of intravenous contrast. CONTRAST:  51mL ISOVUE-300 IOPAMIDOL (ISOVUE-300) INJECTION 61% COMPARISON:  Pelvic ultrasound 08/22/2015 FINDINGS: Lower chest: Atelectasis at the LEFT lung base. No pleural fluid. 4 mm nodule in the LEFT lower lobe on image 1. Hepatobiliary: Tiny hypodense lesion the RIGHT hepatic lobe measuring 3 mm (image 14, series 2) cannot be fully characterize. No biliary duct dilatation. Normal gallbladder. Common bile duct normal caliber. Pancreas: Pancreas is normal. No ductal dilatation. No pancreatic inflammation. Spleen: Normal spleen Adrenals/urinary tract: Adrenal glands normal. There are two nonobstructing calculi in lower pole of the LEFT kidney. No ureterolithiasis or obstructive uropathy. Stomach/Bowel: The stomach contains a moderate to large volume of oral contrast. Mild reflux noted into the distal esophagus. Duodenum appears normal. The proximal small bowel is mildly distended to 33 mm. There is oral contrast in the proximal small bowel. The distal small bowel is completely collapsed measuring only 8 mm. Potential transition point in the LEFT lower abdominal there is small protrusion (10 mm) through the ventral abdominal wall at this level (image 61, series 2, also seen on coronal image 42, series 602). This small protrusion is just lateral to the rectus abdominus muscle. The colon is collapsed. There is no intraperitoneal free air.  No pneumatosis. Vascular/Lymphatic: Heavy calcification of the abdominal aorta. No retroperitoneal periportal lymphadenopathy. Reproductive: Uterus appears normal. There is moderate free fluid the pelvis. Ovaries been removed. Other: No intraperitoneal free air. Musculoskeletal: No aggressive osseous lesion. IMPRESSION: 1. Small bowel obstruction pattern  with dilated proximal small bowel and decompressed distal small bowel and colon. 2. Small protrusion along the LEFT lower quadrant ventral abdominal wall. Differential would include small volume fluid or potential bowel extending into trocar port access. Findings could represent point of obstruction. 3. No pneumatosis or portal venous gas. 4. Small free fluid in the pelvis. 5. Nonobstructing LEFT renal calculi. 6. Small pulmonary nodule at the LEFT lung base. No follow-up needed if patient is low-risk. Non-contrast chest CT can be considered in 12 months if patient is high-risk. This recommendation follows the consensus statement: Guidelines for Management of Incidental Pulmonary Nodules Detected on CT Images:From the Fleischner Society 2017; published online before print (10.1148/radiol.SG:5268862). Findings conveyed toANGELA [name] on 11/18/2015  at18:41. Electronically Signed   By: Suzy Bouchard M.D.   On: 11/18/2015 18:44   Review of Systems  Constitutional: Negative for chills and fever.  HENT: Negative for congestion.   Eyes: Negative for blurred vision and double vision.  Respiratory: Negative for cough and shortness of breath.   Cardiovascular: Negative for chest pain and palpitations.  Gastrointestinal: Positive for nausea and vomiting. Negative for abdominal pain, blood in stool, constipation and diarrhea.  Genitourinary: Negative for dysuria, frequency and urgency.  Musculoskeletal: Negative for myalgias.  Neurological: Negative for dizziness and headaches.   Blood pressure 133/82, pulse 73, temperature 98.2 F (36.8 C), temperature source Oral, resp. rate 18. Physical Exam  Constitutional: She is oriented to person, place, and time. She appears well-developed and well-nourished.  HENT:  Head: Normocephalic and atraumatic.  Eyes: Conjunctivae and EOM are normal. Pupils are equal, round, and reactive to light.  Neck: Normal range of motion. Neck supple.  Cardiovascular: Normal rate  and regular rhythm.   Respiratory: Effort normal and breath sounds normal. No respiratory distress.  GI: Soft.  Musculoskeletal: Normal range of motion.  Neurological: She is alert and oriented to person, place, and time.  Skin: Skin is warm and dry.    Assessment/Plan: Post op SBO. Pt having bowel movements so will hold off on placing NG.  Keep NPO.  Recheck labs in AM.  Will follow with you.  Mi Balla C. [id number], [id number] PM

## 2015-11-19 DIAGNOSIS — K56609 Unspecified intestinal obstruction, unspecified as to partial versus complete obstruction: Secondary | ICD-10-CM | POA: Diagnosis present

## 2015-11-19 LAB — CBC WITH DIFFERENTIAL/PLATELET
Basophils Absolute: 0 10*3/uL (ref 0.0–0.1)
Basophils Relative: 0 %
Eosinophils Absolute: 0.1 10*3/uL (ref 0.0–0.7)
Eosinophils Relative: 1 %
HCT: 34.2 % — ABNORMAL LOW (ref 36.0–46.0)
Hemoglobin: 10.8 g/dL — ABNORMAL LOW (ref 12.0–15.0)
Lymphocytes Relative: 22 %
Lymphs Abs: 3.2 10*3/uL (ref 0.7–4.0)
MCH: 27 pg (ref 26.0–34.0)
MCHC: 31.6 g/dL (ref 30.0–36.0)
MCV: 85.5 fL (ref 78.0–100.0)
Monocytes Absolute: 0.8 10*3/uL (ref 0.1–1.0)
Monocytes Relative: 6 %
Neutro Abs: 10.3 10*3/uL — ABNORMAL HIGH (ref 1.7–7.7)
Neutrophils Relative %: 71 %
Platelets: 314 10*3/uL (ref 150–400)
RBC: 4 MIL/uL (ref 3.87–5.11)
RDW: 17.3 % — ABNORMAL HIGH (ref 11.5–15.5)
WBC: 14.3 10*3/uL — ABNORMAL HIGH (ref 4.0–10.5)

## 2015-11-19 LAB — BASIC METABOLIC PANEL
Anion gap: 11 (ref 5–15)
BUN: 17 mg/dL (ref 6–20)
CO2: 30 mmol/L (ref 22–32)
Calcium: 9.6 mg/dL (ref 8.9–10.3)
Chloride: 95 mmol/L — ABNORMAL LOW (ref 101–111)
Creatinine, Ser: 1.36 mg/dL — ABNORMAL HIGH (ref 0.44–1.00)
GFR calc Af Amer: 47 mL/min — ABNORMAL LOW (ref >60)
GFR calc non Af Amer: 40 mL/min — ABNORMAL LOW (ref >60)
Glucose, Bld: 111 mg/dL — ABNORMAL HIGH (ref 65–99)
Potassium: 2.4 mmol/L — CL (ref 3.5–5.1)
Sodium: 136 mmol/L (ref 135–145)

## 2015-11-19 MED ORDER — POTASSIUM CHLORIDE 10 MEQ/100ML IV SOLN
10.0000 meq | INTRAVENOUS | Status: AC
  Administered 2015-11-19 (×3): 10 meq via INTRAVENOUS
  Filled 2015-11-19 (×3): qty 100

## 2015-11-19 MED ORDER — ENOXAPARIN SODIUM 40 MG/0.4ML ~~LOC~~ SOLN
40.0000 mg | SUBCUTANEOUS | Status: DC
  Administered 2015-11-19 – 2015-11-25 (×5): 40 mg via SUBCUTANEOUS
  Filled 2015-11-19 (×8): qty 0.4

## 2015-11-19 MED ORDER — LACTATED RINGERS IV SOLN
INTRAVENOUS | Status: DC
  Administered 2015-11-19: 12:00:00 via INTRAVENOUS

## 2015-11-19 MED ORDER — KCL IN DEXTROSE-NACL 20-5-0.45 MEQ/L-%-% IV SOLN
INTRAVENOUS | Status: DC
  Administered 2015-11-19 – 2015-11-20 (×3): via INTRAVENOUS
  Filled 2015-11-19 (×3): qty 1000

## 2015-11-19 MED ORDER — KCL-LACTATED RINGERS 20 MEQ/L IV SOLN
INTRAVENOUS | Status: DC
  Filled 2015-11-19: qty 1000

## 2015-11-19 NOTE — Progress Notes (Signed)
Rachel [name] is a68 y.o.  [id number]  Post Op Date # 7 Laparoscopic BSO;  Hospital Day #2 Small Bowel Obstruction  Subjective: Patient is without any nausea,  so far today but has not passed flatus. Patient has The patient is not having any pain. Remains NPO per General Surgery consult.   Objective: Vital signs in last 24 hours: Temp:  [98.2 F (36.8 C)-99.6 F (37.6 C)] 99.4 F (37.4 C) (07/25 0508) Pulse Rate:  [73-99] 99 (07/25 0508) Resp:  [16-18] 18 (07/25 0508) BP: (107-135)/(64-82) 107/64 (07/25 0508) SpO2:  [90 %-94 %] 93 % (07/25 0508)  Intake/Output from previous day: 07/24 0701 - 07/25 0700 In: 1577.8 [P.O.:946; I.V.:631.8] Out: 1300 [Urine:300] Intake/Output this shift: No intake/output data recorded.  Recent Labs Lab 11/13/15 0805 11/14/15 0527 11/19/15 0524  WBC 10.0 7.9 14.3*  HGB 10.5* 9.8* 10.8*  HCT 32.8* 30.0* 34.2*  PLT 295 269 314     Recent Labs Lab 11/12/15 1618 11/14/15 0527 11/19/15 0524  NA 135 138 136  K 3.7 3.3* 2.4*  CL 106 108 95*  CO2 22 25 30   BUN 19 20 17   CREATININE 0.83 1.09* 1.36*  CALCIUM 8.6* 8.2* 9.6  GLUCOSE 138* 85 111*    EXAM: General: alert, cooperative and no distress Resp: clear to auscultation bilaterally Cardio: regular rate and rhythm, S1, S2 normal, no murmur, click, rub or gallop GI: Abdomen slightly distended, soft, non-tender with occasional bowel sounds. Extremities: SCD hose in place and functioning;  no calf tenderness.   Assessment:  S/P : Laparoscopic Bilateral Salpingo-oophorectomy-Post Op Day #7  Small Bowel Obstruction-Vomiting Hypokalemia, Hypochloremia and elevated Creatinine  Plan: NPO Routine care per Dr. Mancel Bale and General Surgery Consultant  LOS: 1 day    POWELL,ELMIRA, PA-C 11/19/2015 8:53 AM   Pt had a BM earlier today that was normally formed and dark per her report.  She has not passed flatus however.  Abdomen is softly distended an BS are no longer hyperactive perhaps  slightly hypoactive.  Will keep NPO.  Potassium is being repleted.  Gen Surgery input appreciated.  Pt has not vomited until last night after gastrograffin.

## 2015-11-19 NOTE — Progress Notes (Signed)
Rachel [name]:  [phone] General Surgery Progress Note   LOS: 1 day  POD -     Assessment/Plan: 1.  Partial SBO vs ileus  Lap bilateral salpingo-oophorectomy - 11/12/2015 - [facility]. [facility]  WBC - 14,300 - 11/19/2015  Clinically she is better - will keep NPO until tomorrow  Dr. Harlow Asa is our surgeon of the week and will follow her tomorrow.  2.  Hypokalemia  K+ - 2.4 - 11/19/2015  Replacement ordered and will adjust IVF's.  3.  DVT prophylaxis - Will start chemoprophylaxis   Active Problems:   Nausea with vomiting   Small bowel obstruction (HCC)  Subjective:  Had small BM and has passed flatus.  She clearly feels better and is not vomiting.  She is in the room by herself.  Objective:   Vitals:   11/19/15 0508 11/19/15 1050  BP: 107/64 116/64  Pulse: 99 90  Resp: 18 18  Temp: 99.4 F (37.4 C) 98.7 F (37.1 C)     Intake/Output from previous day:  07/24 0701 - 07/25 0700 In: 1577.8 [P.O.:946; I.V.:631.8] Out: 1300 [Urine:300; Emesis/NG output:1000]  Intake/Output this shift:  Total I/O In: -  Out: 300 [Urine:300]   Physical Exam:   General: WN AA F who is alert3.    HEENT: Normal. Pupils equal. .   Lungs: Clear   Abdomen: Soft.  Decreased bowel sounds.  No peritoneal signs.   Wound: Wounds look good.   Lab Results:    Recent Labs  11/19/15 0524  WBC 14.3*  HGB 10.8*  HCT 34.2*  PLT 314    BMET   Recent Labs  11/19/15 0524  NA 136  K 2.4*  CL 95*  CO2 30  GLUCOSE 111*  BUN 17  CREATININE 1.36*  CALCIUM 9.6    PT/INR  No results for input(s): LABPROT, INR in the last 72 hours.  ABG  No results for input(s): PHART, HCO3 in the last 72 hours.  Invalid input(s): PCO2, PO2   Studies/Results:  Ct Abdomen Pelvis W Contrast  Result Date: 11/18/2015 CLINICAL DATA:  Status post laparoscopic pelvic GYN surgery. Nausea and vomiting. Postop day 6. EXAM: CT ABDOMEN AND PELVIS WITH CONTRAST TECHNIQUE: Multidetector CT imaging of the  abdomen and pelvis was performed using the standard protocol following bolus administration of intravenous contrast. CONTRAST:  90mL ISOVUE-300 IOPAMIDOL (ISOVUE-300) INJECTION 61% COMPARISON:  Pelvic ultrasound 08/22/2015 FINDINGS: Lower chest: Atelectasis at the LEFT lung base. No pleural fluid. 4 mm nodule in the LEFT lower lobe on image 1. Hepatobiliary: Tiny hypodense lesion the RIGHT hepatic lobe measuring 3 mm (image 14, series 2) cannot be fully characterize. No biliary duct dilatation. Normal gallbladder. Common bile duct normal caliber. Pancreas: Pancreas is normal. No ductal dilatation. No pancreatic inflammation. Spleen: Normal spleen Adrenals/urinary tract: Adrenal glands normal. There are two nonobstructing calculi in lower pole of the LEFT kidney. No ureterolithiasis or obstructive uropathy. Stomach/Bowel: The stomach contains a moderate to large volume of oral contrast. Mild reflux noted into the distal esophagus. Duodenum appears normal. The proximal small bowel is mildly distended to 33 mm. There is oral contrast in the proximal small bowel. The distal small bowel is completely collapsed measuring only 8 mm. Potential transition point in the LEFT lower abdominal there is small protrusion (10 mm) through the ventral abdominal wall at this level (image 61, series 2, also seen on coronal image 42, series 602). This small protrusion is just lateral to the rectus abdominus muscle. The colon  is collapsed. There is no intraperitoneal free air.  No pneumatosis. Vascular/Lymphatic: Heavy calcification of the abdominal aorta. No retroperitoneal periportal lymphadenopathy. Reproductive: Uterus appears normal. There is moderate free fluid the pelvis. Ovaries been removed. Other: No intraperitoneal free air. Musculoskeletal: No aggressive osseous lesion. IMPRESSION: 1. Small bowel obstruction pattern with dilated proximal small bowel and decompressed distal small bowel and colon. 2. Small protrusion along the  LEFT lower quadrant ventral abdominal wall. Differential would include small volume fluid or potential bowel extending into trocar port access. Findings could represent point of obstruction. 3. No pneumatosis or portal venous gas. 4. Small free fluid in the pelvis. 5. Nonobstructing LEFT renal calculi. 6. Small pulmonary nodule at the LEFT lung base. No follow-up needed if patient is low-risk. Non-contrast chest CT can be considered in 12 months if patient is high-risk. This recommendation follows the consensus statement: Guidelines for Management of Incidental Pulmonary Nodules Detected on CT Images:From the Fleischner Society 2017; published online before print (10.1148/radiol.SG:5268862). Findings conveyed toANGELA ROBERTS on 11/18/2015  at18:41. Electronically Signed   By: Suzy Bouchard M.D.   On: 11/18/2015 18:44    Anti-infectives:   Anti-infectives    None      Alphonsa Overall, MD, FACS Pager: [phone] Surgery [name]: [phone] 11/19/2015

## 2015-11-20 LAB — CBC WITH DIFFERENTIAL/PLATELET
Basophils Absolute: 0 10*3/uL (ref 0.0–0.1)
Basophils Relative: 0 %
Eosinophils Absolute: 0.3 10*3/uL (ref 0.0–0.7)
Eosinophils Relative: 3 %
HCT: 31 % — ABNORMAL LOW (ref 36.0–46.0)
Hemoglobin: 9.8 g/dL — ABNORMAL LOW (ref 12.0–15.0)
Lymphocytes Relative: 29 %
Lymphs Abs: 3.1 10*3/uL (ref 0.7–4.0)
MCH: 27.5 pg (ref 26.0–34.0)
MCHC: 31.6 g/dL (ref 30.0–36.0)
MCV: 86.8 fL (ref 78.0–100.0)
Monocytes Absolute: 0.5 10*3/uL (ref 0.1–1.0)
Monocytes Relative: 5 %
Neutro Abs: 6.9 10*3/uL (ref 1.7–7.7)
Neutrophils Relative %: 63 %
Platelets: 268 10*3/uL (ref 150–400)
RBC: 3.57 MIL/uL — ABNORMAL LOW (ref 3.87–5.11)
RDW: 17.5 % — ABNORMAL HIGH (ref 11.5–15.5)
WBC: 10.9 10*3/uL — ABNORMAL HIGH (ref 4.0–10.5)

## 2015-11-20 LAB — URINE CULTURE: Special Requests: NORMAL

## 2015-11-20 LAB — BASIC METABOLIC PANEL
Anion gap: 5 (ref 5–15)
BUN: 17 mg/dL (ref 6–20)
CO2: 33 mmol/L — ABNORMAL HIGH (ref 22–32)
Calcium: 9.1 mg/dL (ref 8.9–10.3)
Chloride: 102 mmol/L (ref 101–111)
Creatinine, Ser: 1.33 mg/dL — ABNORMAL HIGH (ref 0.44–1.00)
GFR calc Af Amer: 48 mL/min — ABNORMAL LOW (ref >60)
GFR calc non Af Amer: 41 mL/min — ABNORMAL LOW (ref >60)
Glucose, Bld: 124 mg/dL — ABNORMAL HIGH (ref 65–99)
Potassium: 3.2 mmol/L — ABNORMAL LOW (ref 3.5–5.1)
Sodium: 140 mmol/L (ref 135–145)

## 2015-11-20 MED ORDER — PANTOPRAZOLE SODIUM 40 MG PO TBEC
40.0000 mg | DELAYED_RELEASE_TABLET | ORAL | Status: DC
  Administered 2015-11-20 – 2015-11-22 (×2): 40 mg via ORAL
  Filled 2015-11-20 (×2): qty 1

## 2015-11-20 MED ORDER — POTASSIUM CHLORIDE 10 MEQ/100ML IV SOLN: 10.0000 meq | INTRAVENOUS | Status: DC

## 2015-11-20 MED ORDER — KCL IN DEXTROSE-NACL 20-5-0.45 MEQ/L-%-% IV SOLN
INTRAVENOUS | Status: DC
  Administered 2015-11-20: 16:00:00 via INTRAVENOUS
  Administered 2015-11-20 – 2015-11-21 (×3): 125 mL/h via INTRAVENOUS
  Administered 2015-11-21 – 2015-11-24 (×8): via INTRAVENOUS
  Filled 2015-11-20 (×14): qty 1000

## 2015-11-20 MED ORDER — POTASSIUM CHLORIDE 10 MEQ/100ML IV SOLN
10.0000 meq | INTRAVENOUS | Status: AC
  Administered 2015-11-20 (×5): 10 meq via INTRAVENOUS
  Filled 2015-11-20 (×5): qty 100

## 2015-11-20 MED ORDER — FAMOTIDINE IN NACL 20-0.9 MG/50ML-% IV SOLN
20.0000 mg | Freq: Two times a day (BID) | INTRAVENOUS | Status: DC
  Administered 2015-11-20 – 2015-11-21 (×2): 20 mg via INTRAVENOUS
  Filled 2015-11-20 (×4): qty 50

## 2015-11-20 NOTE — Progress Notes (Signed)
Rachel [name] is a41 y.o.  [id number]    Post Op Date # 8 Bilateral Salpingo-oophorectomy; Hospital Day #3 Vomiting/Ileus (favored) vs SBO  Subjective: Patient is Doing well postoperatively. Since admission for vomiting, doing well with minimal symptoms. Patient has The patient is not having any pain. Patient has not vomited since Monday after CT contrast was consumed.  Has a watery stool this morning. Ambulating without difficulty and remains NPO.  Objective: Vital signs in last 24 hours: Temp:  [98.4 F (36.9 C)-98.9 F (37.2 C)] 98.8 F (37.1 C) (07/26 0514) Pulse Rate:  [68-101] 68 (07/26 0514) Resp:  [16-20] 18 (07/26 0514) BP: (93-116)/(59-78) 93/60 (07/26 0514) SpO2:  [90 %-92 %] 92 % (07/26 0514)  Intake/Output from previous day: 07/25 0701 - 07/26 0700 In: 1860.4 [I.V.:1860.4] Out: 550 [Urine:550] Intake/Output this shift: Total I/O In: -  Out: 200 [Urine:200]  Recent Labs Lab 11/14/15 0527 11/19/15 0524 11/20/15 0505  WBC 7.9 14.3* 10.9*  HGB 9.8* 10.8* 9.8*  HCT 30.0* 34.2* 31.0*  PLT 269 314 268     Recent Labs Lab 11/14/15 0527 11/19/15 0524 11/20/15 0505  NA 138 136 140  K 3.3* 2.4* 3.2*  CL 108 95* 102  CO2 25 30 33*  BUN 20 17 17   CREATININE 1.09* 1.36* 1.33*  CALCIUM 8.2* 9.6 9.1  GLUCOSE 85 111* 124*    EXAM: General: alert, cooperative and no distress Resp: clear to auscultation bilaterally Cardio: regular rate and rhythm, S1, S2 normal, no murmur, click, rub or gallop GI: Decreased bowel sounds, soft and non-tender Extremities: no calf tenderness.   Assessment: s/p : Laparoscopic Bilateral Salpingo-oophorectomy Post Op Day #8  Vomiting (attributed to an ilieus) Hospital Day #3  Plan: Seen by General Surgeon-Dr. Dalbert Batman who advised clear liquid diet and potassium supplementation   Routine care  LOS: 2 days    POWELL,ELMIRA, PA-C 11/20/2015 8:14 AM   BS are decreased.  Pt advanced to clear fluids and tolerating clears  without n/v.  Abdomen continues to be softly distended.  Repeat labs in am.  strick I's/O's.  Denies any flatus and last BM was watery per pt.  Will cont to observe.  Dr. Dalbert Batman thinks pt had ileus and less likely SBO.

## 2015-11-20 NOTE — Progress Notes (Signed)
Subjective:  HD#3  She is feeling better.  Denies pain or nausea.  No wound from his. Had a loose bowel movement.  Says she's not passing flatus.  Ambulating in halls.  Potassium up to 3.2.  We'll supplement further.  Creatinine has gone from 0.83 on July 18 to 1.33 although BUN is stable at 17.  WBC down to 10,900.  Objective: Vital signs in last 24 hours: Temp:  [98.4 F (36.9 C)-98.9 F (37.2 C)] 98.8 F (37.1 C) (07/26 0514) Pulse Rate:  [68-101] 68 (07/26 0514) Resp:  [16-20] 18 (07/26 0514) BP: (93-116)/(59-78) 93/60 (07/26 0514) SpO2:  [90 %-92 %] 92 % (07/26 0514) Last BM Date: 11/19/15  Intake/Output from previous day: 07/25 0701 - 07/26 0700 In: 1860.4 [I.V.:1860.4] Out: 550 [Urine:550] Intake/Output this shift: Total I/O In: -  Out: 200 [Urine:200]  General appearance: Alert and cooperative.  No distress.  Mental status normal. Resp: clear to auscultation bilaterally GI: Abdomen soft.  Doesn't seem distended.  Hypoactive bowel sounds.  Doesn't seem tender.  Wounds look fine.  Lab Results:   Recent Labs  11/19/15 0524 11/20/15 0505  WBC 14.3* 10.9*  HGB 10.8* 9.8*  HCT 34.2* 31.0*  PLT 314 268   BMET  Recent Labs  11/19/15 0524 11/20/15 0505  NA 136 140  K 2.4* 3.2*  CL 95* 102  CO2 30 33*  GLUCOSE 111* 124*  BUN 17 17  CREATININE 1.36* 1.33*  CALCIUM 9.6 9.1   PT/INR No results for input(s): LABPROT, INR in the last 72 hours. ABG No results for input(s): PHART, HCO3 in the last 72 hours.  Invalid input(s): PCO2, PO2  Studies/Results: Ct Abdomen Pelvis W Contrast  Result Date: 11/18/2015 CLINICAL DATA:  Status post laparoscopic pelvic GYN surgery. Nausea and vomiting. Postop day 6. EXAM: CT ABDOMEN AND PELVIS WITH CONTRAST TECHNIQUE: Multidetector CT imaging of the abdomen and pelvis was performed using the standard protocol following bolus administration of intravenous contrast. CONTRAST:  68m ISOVUE-300 IOPAMIDOL (ISOVUE-300)  INJECTION 61% COMPARISON:  Pelvic ultrasound 08/22/2015 FINDINGS: Lower chest: Atelectasis at the LEFT lung base. No pleural fluid. 4 mm nodule in the LEFT lower lobe on image 1. Hepatobiliary: Tiny hypodense lesion the RIGHT hepatic lobe measuring 3 mm (image 14, series 2) cannot be fully characterize. No biliary duct dilatation. Normal gallbladder. Common bile duct normal caliber. Pancreas: Pancreas is normal. No ductal dilatation. No pancreatic inflammation. Spleen: Normal spleen Adrenals/urinary tract: Adrenal glands normal. There are two nonobstructing calculi in lower pole of the LEFT kidney. No ureterolithiasis or obstructive uropathy. Stomach/Bowel: The stomach contains a moderate to large volume of oral contrast. Mild reflux noted into the distal esophagus. Duodenum appears normal. The proximal small bowel is mildly distended to 33 mm. There is oral contrast in the proximal small bowel. The distal small bowel is completely collapsed measuring only 8 mm. Potential transition point in the LEFT lower abdominal there is small protrusion (10 mm) through the ventral abdominal wall at this level (image 61, series 2, also seen on coronal image 42, series 602). This small protrusion is just lateral to the rectus abdominus muscle. The colon is collapsed. There is no intraperitoneal free air.  No pneumatosis. Vascular/Lymphatic: Heavy calcification of the abdominal aorta. No retroperitoneal periportal lymphadenopathy. Reproductive: Uterus appears normal. There is moderate free fluid the pelvis. Ovaries been removed. Other: No intraperitoneal free air. Musculoskeletal: No aggressive osseous lesion. IMPRESSION: 1. Small bowel obstruction pattern with dilated proximal small bowel and decompressed distal small  bowel and colon. 2. Small protrusion along the LEFT lower quadrant ventral abdominal wall. Differential would include small volume fluid or potential bowel extending into trocar port access. Findings could represent  point of obstruction. 3. No pneumatosis or portal venous gas. 4. Small free fluid in the pelvis. 5. Nonobstructing LEFT renal calculi. 6. Small pulmonary nodule at the LEFT lung base. No follow-up needed if patient is low-risk. Non-contrast chest CT can be considered in 12 months if patient is high-risk. This recommendation follows the consensus statement: Guidelines for Management of Incidental Pulmonary Nodules Detected on CT Images:From the Fleischner Society 2017; published online before print (10.1148/radiol.[id number]). Findings conveyed toANGELA ROBERTS on 11/18/2015  at18:41. Electronically Signed   By: Suzy Bouchard M.D.   On: 11/18/2015 18:44   Anti-infectives: Anti-infectives    None      Assessment/Plan:  Partial SBO versus postop ileus. Lab bilateral salpingo-oophorectomy 11/12/2015. Resolving Full liquid diet Dr. Harlow Asa is our surgeon of the week and will follow her as needed. Patient unaware of her pathology report.  Defer to gynecology service to discuss that.  Hypokalemia. Improving.  This may be contributing to ileus so will continue to supplement with 5 more runs of KCl. Lab work ordered tomorrow  Elevated creatinine. (over baseline)  Uncertain etiology. Continue IVs at 125 an hour Check a met tomorrow  DVT prophylaxis.  Lovenox started yesterday.   LOS: 2 days    Derrian Poli M 11/20/2015

## 2015-11-20 NOTE — Progress Notes (Signed)
I received a referral due to pt's prolonged healing process.  She was in good spirits and reported that she is doing much better.  She stated that she has good support at home from her husband, siblings and son). She did not have any particular concerns at this time, but she appreciated being checked on.  Chaplain Janne Napoleon, [place] Pager, [phone] 1:26 PM    11/20/15 1300  Clinical Encounter Type  Visited With Patient  Visit Type Spiritual support  Referral From Nurse  Spiritual Encounters  Spiritual Needs Other (Comment) (Re-admit and prolonged healing)

## 2015-11-20 NOTE — Care Management (Signed)
For any discharge needs CM can notify/call Meridth with [facility] # [phone]

## 2015-11-21 ENCOUNTER — Encounter (HOSPITAL_COMMUNITY): Payer: Self-pay | Admitting: *Deleted

## 2015-11-21 ENCOUNTER — Institutional Professional Consult (permissible substitution): Payer: Managed Care, Other (non HMO) | Admitting: Internal Medicine

## 2015-11-21 ENCOUNTER — Inpatient Hospital Stay (HOSPITAL_COMMUNITY): Payer: Managed Care, Other (non HMO)

## 2015-11-21 LAB — CBC
HCT: 32.3 % — ABNORMAL LOW (ref 36.0–46.0)
Hemoglobin: 10.1 g/dL — ABNORMAL LOW (ref 12.0–15.0)
MCH: 27.2 pg (ref 26.0–34.0)
MCHC: 31.3 g/dL (ref 30.0–36.0)
MCV: 87.1 fL (ref 78.0–100.0)
Platelets: 254 10*3/uL (ref 150–400)
RBC: 3.71 MIL/uL — ABNORMAL LOW (ref 3.87–5.11)
RDW: 17.5 % — ABNORMAL HIGH (ref 11.5–15.5)
WBC: 10.9 10*3/uL — ABNORMAL HIGH (ref 4.0–10.5)

## 2015-11-21 LAB — BASIC METABOLIC PANEL
Anion gap: 5 (ref 5–15)
BUN: 9 mg/dL (ref 6–20)
CO2: 26 mmol/L (ref 22–32)
Calcium: 8.4 mg/dL — ABNORMAL LOW (ref 8.9–10.3)
Chloride: 107 mmol/L (ref 101–111)
Creatinine, Ser: 1.14 mg/dL — ABNORMAL HIGH (ref 0.44–1.00)
GFR calc Af Amer: 58 mL/min — ABNORMAL LOW (ref >60)
GFR calc non Af Amer: 50 mL/min — ABNORMAL LOW (ref >60)
Glucose, Bld: 114 mg/dL — ABNORMAL HIGH (ref 65–99)
Potassium: 3.3 mmol/L — ABNORMAL LOW (ref 3.5–5.1)
Sodium: 138 mmol/L (ref 135–145)

## 2015-11-21 MED ORDER — FAMOTIDINE IN NACL 20-0.9 MG/50ML-% IV SOLN
20.0000 mg | Freq: Two times a day (BID) | INTRAVENOUS | Status: DC
  Administered 2015-11-21 – 2015-11-24 (×7): 20 mg via INTRAVENOUS
  Filled 2015-11-21 (×10): qty 50

## 2015-11-21 MED ORDER — POTASSIUM CHLORIDE 10 MEQ/100ML IV SOLN
10.0000 meq | INTRAVENOUS | Status: AC
  Administered 2015-11-21 (×6): 10 meq via INTRAVENOUS
  Filled 2015-11-21 (×6): qty 100

## 2015-11-21 NOTE — Progress Notes (Signed)
Rachel [name] is a48 y.o.  [id number]  Post Op Date # 9: Bilateral Salpingo-oophorectomy;  Hospital Day #4 Vomiting/Ileus  Subjective: Patient is doing well, states she feels much better than yesterday. Reported vomiting but states that she believed it to be due to acid reflux as she will vomit sometimes because of it. Patient has The patient is not having any pain. Continues to ambulate, void and has had #3 watery stools.  No emesis since 9:45 p.m. and remains NPO   Objective: Vital signs in last 24 hours: Temp:  [97.7 F (36.5 C)-99.6 F (37.6 C)] 98.9 F (37.2 C) (07/27 [id number]) Pulse Rate:  [72-93] 80 (07/27 0638) Resp:  [16-20] 18 (07/27 [id number]) BP: (110-142)/(69-85) 110/83 (07/27 0638) SpO2:  [90 %-100 %] 94 % (07/27 [id number]) Weight:  [201 lb (91.2 kg)] 201 lb (91.2 kg) (07/27 0022)  Intake/Output from previous day: 07/26 0701 - 07/27 0700 In: 4539.8 [P.O.:960; I.V.:3529.8] Out: 2750 [Urine:1250] Intake/Output this shift: No intake/output data recorded.  Recent Labs Lab 11/19/15 0524 11/20/15 0505 11/21/15 0505  WBC 14.3* 10.9* 10.9*  HGB 10.8* 9.8* 10.1*  HCT 34.2* 31.0* 32.3*  PLT 314 268 254     Recent Labs Lab 11/19/15 0524 11/20/15 0505 11/21/15 0505  NA 136 140 138  K 2.4* 3.2* 3.3*  CL 95* 102 107  CO2 30 33* 26  BUN 17 17 9   CREATININE 1.36* 1.33* 1.14*  CALCIUM 9.6 9.1 8.4*  GLUCOSE 111* 124* 114*    EXAM: General: alert, cooperative, mild distress and sitting up in chair. Resp: clear to auscultation bilaterally Cardio: regular rate and rhythm, S1, S2 normal, no murmur, click, rub or gallop GI: Slight increase in bowel sounds in all 4 quadrants; soft, non-tender; wound sites without evidence of infection. Extremities: No calf tenderness   Assessment: s/p : Laparoscopic Bilateral Salpingo-oophorectomy Post Op Day # 9 Recurrent Vomiting/Ileus Hospital Day #4 Hypokalemia-resolved  Plan: NPO Abdominal X-ray series-pending  Continue potassium  supplements per General Surgeon  LOS: 3 days    POWELL,ELMIRA, PA-C 11/21/2015 7:56 AM  Xray - Early/partial SBO vs Ileus Pt still has not passed flatus.  Watery stools continue.  Pt has no nausea or pain.  Lungs are CTA and Heart RRR. Will continue to observe.  She looks good clinically and in no distress but it concerns me that she has no BS, no flatus and watery BMs.  An area in the LLQ that was not clearly identifiable was seen in the LLQ port.  I discussed possible dx laparoscopy if no improvement for evaluation.

## 2015-11-21 NOTE — Progress Notes (Signed)
  Subjective:  Patient reports recurrent episodes of bilious emesis Having loose stools, at least 3 Denies abdominal pain  Going down for Multiview abdominal x-ray at this time  Labs this morning revealed WBC 10,900, hemoglobin 10.1.  Potassium 3.3.  Creatinine 1.14.  Objective: Vital signs in last 24 hours: Temp:  [97.7 F (36.5 C)-99.6 F (37.6 C)] 98.9 F (37.2 C) (07/27 [id number]) Pulse Rate:  [72-93] 80 (07/27 0638) Resp:  [16-20] 18 (07/27 [id number]) BP: (110-142)/(69-85) 110/83 (07/27 [id number]) SpO2:  [90 %-100 %] 94 % (07/27 [id number]) Weight:  [91.2 kg (201 lb)] 91.2 kg (201 lb) (07/27 0022) Last BM Date: 11/20/15  Intake/Output from previous day: 07/26 0701 - 07/27 0700 In: 4539.8 [P.O.:960; I.V.:3529.8; IV Piggyback:50] Out: 2750 [Urine:1250; Emesis/NG output:1500] Intake/Output this shift: Total I/O In: 3579.8 [I.V.:3529.8; IV Piggyback:50] Out: 1500 [Urine:700; Emesis/NG output:800]  General appearance: Alert.  Sitting in chair.  Very pleasant.  In no distress whatsoever.  Just a little frustrated. Resp: clear to auscultation bilaterally GI: Abdomen is soft and nontender.  Not noticeably distended.  Wounds look fine.  Lab Results:   Recent Labs  11/20/15 0505 11/21/15 0505  WBC 10.9* 10.9*  HGB 9.8* 10.1*  HCT 31.0* 32.3*  PLT 268 254   BMET  Recent Labs  11/20/15 0505 11/21/15 0505  NA 140 138  K 3.2* 3.3*  CL 102 107  CO2 33* 26  GLUCOSE 124* 114*  BUN 17 9  CREATININE 1.33* 1.14*  CALCIUM 9.1 8.4*   PT/INR No results for input(s): LABPROT, INR in the last 72 hours. ABG No results for input(s): PHART, HCO3 in the last 72 hours.  Invalid input(s): PCO2, PO2  Studies/Results: No results found.  Anti-infectives: Anti-infectives    None      Assessment/Plan:  Partial SBO versus postop ileus. Lab bilateral salpingo-oophorectomy 11/12/2015. Now with recurrent bilious emesis, but now with loose stools and no pain. It is not entirely clear what  is going on Multiview abdominal x-rays now Dr. Harlow Asa is our surgeon of the week and will follow her as needed. There is no acute surgical need.  Hypokalemia. Improving.  This may be contributing to ileus so will continue to supplement with 6 more runs of KCl. Lab work ordered tomorrow  Elevated creatinine. (over baseline)  Uncertain etiology. Continue IVs at 125 an hour This has not gotten any worse.  Check labs tomorrow.  DVT prophylaxis.  Lovenox started yesterday    LOS: 3 days    Rachel [name] M 11/21/2015

## 2015-11-22 ENCOUNTER — Inpatient Hospital Stay (HOSPITAL_COMMUNITY): Payer: Managed Care, Other (non HMO) | Admitting: Anesthesiology

## 2015-11-22 ENCOUNTER — Encounter (HOSPITAL_COMMUNITY)
Admission: AD | Disposition: A | Discharge status: Home / Self Care | Payer: Self-pay | Source: Ambulatory Visit | Attending: Obstetrics and Gynecology

## 2015-11-22 HISTORY — PX: LAPAROSCOPY: SHX197

## 2015-11-22 LAB — CBC WITH DIFFERENTIAL/PLATELET
Basophils Absolute: 0 10*3/uL (ref 0.0–0.1)
Basophils Relative: 0 %
Eosinophils Absolute: 0.2 10*3/uL (ref 0.0–0.7)
Eosinophils Relative: 3 %
HCT: 30.7 % — ABNORMAL LOW (ref 36.0–46.0)
Hemoglobin: 9.6 g/dL — ABNORMAL LOW (ref 12.0–15.0)
Lymphocytes Relative: 20 %
Lymphs Abs: 1.8 10*3/uL (ref 0.7–4.0)
MCH: 27.3 pg (ref 26.0–34.0)
MCHC: 31.3 g/dL (ref 30.0–36.0)
MCV: 87.2 fL (ref 78.0–100.0)
Monocytes Absolute: 0.6 10*3/uL (ref 0.1–1.0)
Monocytes Relative: 7 %
Neutro Abs: 6.5 10*3/uL (ref 1.7–7.7)
Neutrophils Relative %: 70 %
Platelets: 245 10*3/uL (ref 150–400)
RBC: 3.52 MIL/uL — ABNORMAL LOW (ref 3.87–5.11)
RDW: 17.6 % — ABNORMAL HIGH (ref 11.5–15.5)
WBC: 9.1 10*3/uL (ref 4.0–10.5)

## 2015-11-22 LAB — COMPREHENSIVE METABOLIC PANEL
ALT: 12 U/L — ABNORMAL LOW (ref 14–54)
AST: 20 U/L (ref 15–41)
Albumin: 2.8 g/dL — ABNORMAL LOW (ref 3.5–5.0)
Alkaline Phosphatase: 35 U/L — ABNORMAL LOW (ref 38–126)
Anion gap: 6 (ref 5–15)
BUN: <5 mg/dL — ABNORMAL LOW (ref 6–20)
CO2: 20 mmol/L — ABNORMAL LOW (ref 22–32)
Calcium: 8 mg/dL — ABNORMAL LOW (ref 8.9–10.3)
Chloride: 108 mmol/L (ref 101–111)
Creatinine, Ser: 1.14 mg/dL — ABNORMAL HIGH (ref 0.44–1.00)
GFR calc Af Amer: 58 mL/min — ABNORMAL LOW (ref >60)
GFR calc non Af Amer: 50 mL/min — ABNORMAL LOW (ref >60)
Glucose, Bld: 109 mg/dL — ABNORMAL HIGH (ref 65–99)
Potassium: 3.7 mmol/L (ref 3.5–5.1)
Sodium: 134 mmol/L — ABNORMAL LOW (ref 135–145)
Total Bilirubin: 0.8 mg/dL (ref 0.3–1.2)
Total Protein: 5.4 g/dL — ABNORMAL LOW (ref 6.5–8.1)

## 2015-11-22 LAB — ABO/RH: ABO/RH(D): A POS

## 2015-11-22 LAB — PREPARE RBC (CROSSMATCH)

## 2015-11-22 SURGERY — LAPAROSCOPY, DIAGNOSTIC
Anesthesia: General | Site: Abdomen

## 2015-11-22 MED ORDER — DIPHENHYDRAMINE HCL 12.5 MG/5ML PO ELIX: 12.5000 mg | ORAL_SOLUTION | Freq: Four times a day (QID) | ORAL | Status: DC | PRN

## 2015-11-22 MED ORDER — LACTATED RINGERS IV SOLN
INTRAVENOUS | Status: DC
  Administered 2015-11-22: 20:00:00 via INTRAVENOUS

## 2015-11-22 MED ORDER — PROPOFOL 10 MG/ML IV BOLUS
INTRAVENOUS | Status: DC | PRN
  Administered 2015-11-22: 150 mg via INTRAVENOUS

## 2015-11-22 MED ORDER — LIDOCAINE HCL (CARDIAC) 20 MG/ML IV SOLN
INTRAVENOUS | Status: DC | PRN
  Administered 2015-11-22: 80 mg via INTRAVENOUS

## 2015-11-22 MED ORDER — DEXAMETHASONE SODIUM PHOSPHATE 10 MG/ML IJ SOLN
INTRAMUSCULAR | Status: DC | PRN
  Administered 2015-11-22: 4 mg via INTRAVENOUS

## 2015-11-22 MED ORDER — HYDROMORPHONE HCL 1 MG/ML IJ SOLN
INTRAMUSCULAR | Status: AC
  Administered 2015-11-22: 0.25 mg via INTRAVENOUS
  Filled 2015-11-22: qty 1

## 2015-11-22 MED ORDER — ROCURONIUM BROMIDE 100 MG/10ML IV SOLN
INTRAVENOUS | Status: DC | PRN
  Administered 2015-11-22: 50 mg via INTRAVENOUS

## 2015-11-22 MED ORDER — PHENYLEPHRINE HCL 10 MG/ML IJ SOLN
INTRAMUSCULAR | Status: DC | PRN
  Administered 2015-11-22 (×2): 120 ug via INTRAVENOUS
  Administered 2015-11-22: 80 ug via INTRAVENOUS

## 2015-11-22 MED ORDER — ROCURONIUM BROMIDE 100 MG/10ML IV SOLN
INTRAVENOUS | Status: AC
  Filled 2015-11-22: qty 1

## 2015-11-22 MED ORDER — LACTATED RINGERS IV SOLN
INTRAVENOUS | Status: DC | PRN
  Administered 2015-11-22: 16:00:00 via INTRAVENOUS

## 2015-11-22 MED ORDER — FENTANYL CITRATE (PF) 100 MCG/2ML IJ SOLN
INTRAMUSCULAR | Status: DC | PRN
  Administered 2015-11-22 (×2): 100 ug via INTRAVENOUS
  Administered 2015-11-22: 50 ug via INTRAVENOUS
  Administered 2015-11-22: 150 ug via INTRAVENOUS

## 2015-11-22 MED ORDER — CEFAZOLIN SODIUM-DEXTROSE 2-3 GM-% IV SOLR
INTRAVENOUS | Status: DC | PRN
  Administered 2015-11-22: 2 g via INTRAVENOUS

## 2015-11-22 MED ORDER — BUPIVACAINE HCL (PF) 0.25 % IJ SOLN
INTRAMUSCULAR | Status: AC
  Filled 2015-11-22: qty 30

## 2015-11-22 MED ORDER — LIDOCAINE HCL (CARDIAC) 20 MG/ML IV SOLN
INTRAVENOUS | Status: AC
  Filled 2015-11-22: qty 5

## 2015-11-22 MED ORDER — HYDROMORPHONE 1 MG/ML IV SOLN
INTRAVENOUS | Status: AC
  Administered 2015-11-22: 21:00:00 via INTRAVENOUS
  Administered 2015-11-23: 0.4 mg via INTRAVENOUS
  Administered 2015-11-23: 0.6 mg via INTRAVENOUS
  Administered 2015-11-23: 0.3 mg via INTRAVENOUS
  Filled 2015-11-22: qty 25

## 2015-11-22 MED ORDER — SUCCINYLCHOLINE CHLORIDE 20 MG/ML IJ SOLN
INTRAMUSCULAR | Status: DC | PRN
  Administered 2015-11-22: 180 mg via INTRAVENOUS

## 2015-11-22 MED ORDER — DEXAMETHASONE SODIUM PHOSPHATE 4 MG/ML IJ SOLN
INTRAMUSCULAR | Status: AC
  Filled 2015-11-22: qty 1

## 2015-11-22 MED ORDER — FENTANYL CITRATE (PF) 250 MCG/5ML IJ SOLN
INTRAMUSCULAR | Status: AC
  Filled 2015-11-22: qty 5

## 2015-11-22 MED ORDER — DIPHENHYDRAMINE HCL 50 MG/ML IJ SOLN: 12.5000 mg | Freq: Four times a day (QID) | INTRAMUSCULAR | Status: DC | PRN

## 2015-11-22 MED ORDER — MIDAZOLAM HCL 2 MG/2ML IJ SOLN
INTRAMUSCULAR | Status: DC | PRN
  Administered 2015-11-22: 2 mg via INTRAVENOUS

## 2015-11-22 MED ORDER — MIDAZOLAM HCL 2 MG/2ML IJ SOLN
INTRAMUSCULAR | Status: AC
  Filled 2015-11-22: qty 2

## 2015-11-22 MED ORDER — BUPIVACAINE HCL (PF) 0.25 % IJ SOLN
INTRAMUSCULAR | Status: DC | PRN
  Administered 2015-11-22: 9 mL
  Administered 2015-11-22: 20 mL

## 2015-11-22 MED ORDER — ONDANSETRON HCL 4 MG/2ML IJ SOLN
INTRAMUSCULAR | Status: AC
  Filled 2015-11-22: qty 2

## 2015-11-22 MED ORDER — SUGAMMADEX SODIUM 200 MG/2ML IV SOLN
INTRAVENOUS | Status: DC | PRN
  Administered 2015-11-22: 182.4 mg via INTRAVENOUS

## 2015-11-22 MED ORDER — PROPOFOL 10 MG/ML IV BOLUS
INTRAVENOUS | Status: AC
  Filled 2015-11-22: qty 20

## 2015-11-22 MED ORDER — ONDANSETRON HCL 4 MG/2ML IJ SOLN: 4.0000 mg | Freq: Four times a day (QID) | INTRAMUSCULAR | Status: DC | PRN

## 2015-11-22 MED ORDER — ONDANSETRON HCL 4 MG/2ML IJ SOLN
INTRAMUSCULAR | Status: DC | PRN
  Administered 2015-11-22: 4 mg via INTRAVENOUS

## 2015-11-22 MED ORDER — KETOROLAC TROMETHAMINE 30 MG/ML IJ SOLN
INTRAMUSCULAR | Status: AC
  Filled 2015-11-22: qty 1

## 2015-11-22 MED ORDER — HYDROMORPHONE HCL 1 MG/ML IJ SOLN
0.2500 mg | INTRAMUSCULAR | Status: DC | PRN
  Administered 2015-11-22: 0.25 mg via INTRAVENOUS

## 2015-11-22 MED ORDER — SUGAMMADEX SODIUM 200 MG/2ML IV SOLN
INTRAVENOUS | Status: AC
  Filled 2015-11-22: qty 2

## 2015-11-22 MED ORDER — HEPARIN SODIUM (PORCINE) 5000 UNIT/ML IJ SOLN
INTRAMUSCULAR | Status: AC
  Filled 2015-11-22: qty 1

## 2015-11-22 MED ORDER — METHYLENE BLUE 0.5 % INJ SOLN
INTRAVENOUS | Status: AC
  Filled 2015-11-22: qty 10

## 2015-11-22 MED ORDER — SODIUM CHLORIDE 0.9% FLUSH: 9.0000 mL | INTRAVENOUS | Status: DC | PRN

## 2015-11-22 MED ORDER — NALOXONE HCL 0.4 MG/ML IJ SOLN: 0.4000 mg | INTRAMUSCULAR | Status: DC | PRN

## 2015-11-22 SURGICAL SUPPLY — 41 items
BAG SPEC RTRVL LRG 6X4 10 (ENDOMECHANICALS)
CABLE HIGH FREQUENCY MONO STRZ (ELECTRODE) IMPLANT
CLOTH BEACON ORANGE TIMEOUT ST (SAFETY) ×2 IMPLANT
DEVICE TROCAR PUNCTURE CLOSURE (ENDOMECHANICALS) ×1 IMPLANT
DRSG COVADERM PLUS 2X2 (GAUZE/BANDAGES/DRESSINGS) ×4 IMPLANT
DRSG OPSITE POSTOP 3X4 (GAUZE/BANDAGES/DRESSINGS) ×1 IMPLANT
DURAPREP 26ML APPLICATOR (WOUND CARE) ×2 IMPLANT
FORCEPS CUTTING 33CM 5MM (CUTTING FORCEPS) IMPLANT
FORCEPS CUTTING 45CM 5MM (CUTTING FORCEPS) IMPLANT
GLOVE BIO SURGEON STRL SZ7.5 (GLOVE) ×2 IMPLANT
GLOVE BIOGEL PI IND STRL 7.0 (GLOVE) ×1 IMPLANT
GLOVE BIOGEL PI IND STRL 7.5 (GLOVE) ×1 IMPLANT
GLOVE BIOGEL PI INDICATOR 7.0 (GLOVE) ×1
GLOVE BIOGEL PI INDICATOR 7.5 (GLOVE) ×1
GOWN STRL REUS W/TWL LRG LVL3 (GOWN DISPOSABLE) ×4 IMPLANT
LIQUID BAND (GAUZE/BANDAGES/DRESSINGS) ×2 IMPLANT
NEEDLE INSUFFLATION 120MM (ENDOMECHANICALS) ×1 IMPLANT
NS IRRIG 1000ML POUR BTL (IV SOLUTION) ×2 IMPLANT
PACK LAPAROSCOPY BASIN (CUSTOM PROCEDURE TRAY) ×2 IMPLANT
PAD TRENDELENBURG POSITION (MISCELLANEOUS) ×2 IMPLANT
POUCH SPECIMEN RETRIEVAL 10MM (ENDOMECHANICALS) IMPLANT
SCISSORS LAP 5X35 DISP (ENDOMECHANICALS) IMPLANT
SCISSORS LAP 5X45 EPIX DISP (ENDOMECHANICALS) IMPLANT
SET EXT STPCK 38  W/4W STPCK (IV SOLUTION) ×1 IMPLANT
SET IRRIG TUBING LAPAROSCOPIC (IRRIGATION / IRRIGATOR) IMPLANT
SLEEVE XCEL OPT CAN 5 100 (ENDOMECHANICALS) ×2 IMPLANT
SOLUTION ELECTROLUBE (MISCELLANEOUS) IMPLANT
SUT MNCRL AB 3-0 PS2 27 (SUTURE) ×2 IMPLANT
SUT SILK 3 0 SH 30 (SUTURE) ×5 IMPLANT
SUT VICRYL 0 ENDOLOOP (SUTURE) IMPLANT
SUT VICRYL 0 TIES 12 18 (SUTURE) ×1 IMPLANT
SUT VICRYL 0 UR6 27IN ABS (SUTURE) ×3 IMPLANT
SYR 50ML LL SCALE MARK (SYRINGE) IMPLANT
SYSTEM CARTER THOMASON II (TROCAR) ×1 IMPLANT
TOWEL OR 17X24 6PK STRL BLUE (TOWEL DISPOSABLE) ×4 IMPLANT
TRAY FOLEY CATH SILVER 14FR (SET/KITS/TRAYS/PACK) ×2 IMPLANT
TROCAR BALLN 12MMX100 BLUNT (TROCAR) ×1 IMPLANT
TROCAR XCEL NON-BLD 11X100MML (ENDOMECHANICALS) ×2 IMPLANT
TROCAR XCEL NON-BLD 5MMX100MML (ENDOMECHANICALS) ×2 IMPLANT
WARMER LAPAROSCOPE (MISCELLANEOUS) ×2 IMPLANT
WATER STERILE IRR 1000ML POUR (IV SOLUTION) ×2 IMPLANT

## 2015-11-22 NOTE — Progress Notes (Addendum)
Rachel [name] is a62 y.o.  [id number]  Post Op Date # 30:  L/S BSO and Hospital Day #5:  Vomiting/Ileus  Subjective: Patient is continuing to have watery green stools  (#6) with no formed components.  Stool consistency is that of pea soup and is very dark green. Patient has and remains NPO.  Voiding, ambulating but denies passing any flatus.    Objective: Vital signs in last 24 hours: Temp:  [97.4 F (36.3 C)-99.6 F (37.6 C)] 99 F (37.2 C) (07/28 0516) Pulse Rate:  [73-89] 80 (07/28 0516) Resp:  [15-18] 18 (07/28 0516) BP: (102-130)/(72-83) 111/83 (07/28 0516) SpO2:  [93 %-99 %] 95 % (07/28 0516)  Intake/Output from previous day: 07/27 0701 - 07/28 0700 In: 2932.2 [I.V.:2882.2] Out: [id number] [Urine:1675] Intake/Output this shift: No intake/output data recorded.  Recent Labs Lab 11/20/15 0505 11/21/15 0505 11/22/15 0618  WBC 10.9* 10.9* 9.1  HGB 9.8* 10.1* 9.6*  HCT 31.0* 32.3* 30.7*  PLT 268 254 245     Recent Labs Lab 11/20/15 0505 11/21/15 0505 11/22/15 0618  NA 140 138 134*  K 3.2* 3.3* 3.7  CL 102 107 108  CO2 33* 26 20*  BUN 17 9 <5*  CREATININE 1.33* 1.14* 1.14*  CALCIUM 9.1 8.4* 8.0*  PROT  --   --  5.4*  BILITOT  --   --  0.8  ALKPHOS  --   --  35*  ALT  --   --  12*  AST  --   --  20  GLUCOSE 124* 114* 109*    EXAM: General: alert, cooperative and no distress Resp: clear to auscultation bilaterally Cardio: regular rate and rhythm, S1, S2 normal, no murmur, click, rub or gallop GI: soft non-tender, decreased bowel sounds (rarely heard) Extremities: no calf tenderness   Assessment: s/p : Laparoscopic Bilateral Salpingo-oophorectomy (post op day #10) Vomiting/Ileus/Diarrhea  Hospital Day #5  Plan: NPO Routine Care  LOS: 4 days    POWELL,ELMIRA, PA-C 11/22/2015 7:46 AM  Patient continues not to improve with dark green watery BMs and no flatus.  She has vomited anything she eats.  She has no BS and continues to deny any abdominal  pain.  I spoke with Dr. Harlow Asa this morning about dx laparoscopy in order to evaluate the possibility of SBO especially given the radiologist's comment about a small piece of tissue in the LLQ port that he could not exclude as bowel although the area of transition did not occur at that spot.  I have discussed diagnositc laparoscopy with the patient, possible laparotomy, possible bowel resection and even ostomy pouch although unlikely.  Dr. Harlow Asa initially planned to go to go to the OR with me but has gotten held up in surgery at [facility] so one of his partner's, the trauma surgeon on call, Dr. Greer Pickerel, plans to go to the OR with me.  I reviewed all of this with the patient and she would like to proceed and her questions along with her husbands questions have been answered.  Dr. Redmond Pulling will speak with her prior to going to OR.  I have crossed her for 2u PRBCs.

## 2015-11-22 NOTE — Op Note (Signed)
Preop Diagnosis: vomitting and watery stool    Postop Diagnosis: incarcerated left lower quadrant trocar site hernia   Procedure: 1.LAPAROSCOPY DIAGNOSTIC 2.REPAIR OF SEROSAL BOWEL Anesthesia: General   Attending: Everett Graff, MD - Primary  Greer Pickerel, MD (General Surgeon) - Primary  Assistant: N/a  Findings: Loop of bowel incarcerated in LLQ  Pathology: None  Fluids: 1900 cc  UOP: 300 cc  EBL: 5 cc  Complications: None  Procedure:  The patient was taken to the operating room after the risks, benefits, alternatives, complications, treatment options, and expected outcomes were discussed with the patient. The patient verbalized understanding, the patient concurred with the proposed plan and consent signed and witnessed. The patient was taken to the Operating Room and identified as Rachel [name] and the procedure verified as Diagnostic Laparoscopy/Possible Laparotomy.  The patient was placed under general anesthesia per anesthesia staff, the patient was placed in modified dorsal lithotomy position and was prepped, draped, and catheterized in the normal, sterile fashion.  A Time Out was held and the above information confirmed.  The cervix was visualized and an intrauterine manipulator was placed.  The 10 mm umbilical incision was reopened and taken down through the fascia. A purse string stitch of 0 vicryl was placed in the fascia and Hassan placed into intraabdominal cavity.  The laparoscope was introduced and findings as noted above.  Patient was placed in trendelenburg and marcaine injected in the RLQ 5 mm incision site and trocar placed.  The loop of bowel in the LLQ port site was noted and removed by Dr. Redmond Pulling.  Marcaine was injected in the 28mm LLQ incision and fascial closure device used to close the fascia with 0 vicryl.    The bowel was inspected and viable.  A serosal tear was noted and repaired laparoscopically by Dr. Redmond Pulling with 5 interrupted stitches of silk after  making a 15mm port site at the prior 58mm suprapubic incision.  The area over the bowel was irrigated.  The fascia at both 58mm incisions were repaired with 0 vicryl.  Two additional stitches were placed on the umbilical incision fascia.  All incisions were repaired with 3-0 monocryl.  Liquaband was applied as well.  Sponge, instrument, lap and needle counts were correct.  The patient tolerated the procedure well and was awaiting extubation and transfer to the recovery room in stable condition.

## 2015-11-22 NOTE — Brief Op Note (Signed)
11/18/2015 - 11/22/2015  5:41 PM  PATIENT:  Rachel [name]  65 y.o. female  PRE-OPERATIVE DIAGNOSIS:  vomitting and watery stool, possible trocar site hernia;   POST-OPERATIVE DIAGNOSIS:  incarcerated trocar site hernia  PROCEDURE:  Procedure(s): LAPAROSCOPY DIAGNOSTIC with laparoscopic repair of serosal tear (N/A)  SURGEON:  Surgeon(s) and Role:    * Everett Graff, MD - Primary    * Greer Pickerel, MD -Primary  PHYSICIAN ASSISTANT:   ASSISTANTS: none   ANESTHESIA:   local and general  EBL:  Total I/O In: -  Out: [id number] [Urine:1200; Stool:525; Blood:5]  BLOOD ADMINISTERED:none  DRAINS: Urinary Catheter (Foley)   LOCAL MEDICATIONS USED:  MARCAINE     SPECIMEN:  No Specimen  DISPOSITION OF SPECIMEN:  N/A  COUNTS:  YES  TOURNIQUET:  * No tourniquets in log *  DICTATION: .  PLAN OF CARE: Other Dictation: Dictation Number [id number]  PATIENT DISPOSITION:  PACU - hemodynamically stable.   Delay start of Pharmacological VTE agent (>24hrs) due to surgical blood loss or risk of bleeding: no  Leighton Ruff. Redmond Pulling, MD, FACS General, Bariatric, & Minimally Invasive Surgery [facility], [place]

## 2015-11-22 NOTE — Transfer of Care (Signed)
Immediate Anesthesia Transfer of Care Note  Patient: Rachel [name]  Procedure(s) Performed: Procedure(s): LAPAROSCOPY DIAGNOSTIC with repair of serosal tear (N/A)  Patient Location: PACU  Anesthesia Type:General  Level of Consciousness: awake, alert  and oriented  Airway & Oxygen Therapy: Patient Spontanous Breathing and Patient connected to nasal cannula oxygen  Post-op Assessment: Report given to RN, Post -op Vital signs reviewed and stable and Patient moving all extremities  Post vital signs: Reviewed and stable  Last Vitals:  Vitals:   11/22/15 0928 11/22/15 1404  BP: 99/71 123/82  Pulse: 80 86  Resp: 16 18  Temp: 37 C 36.9 C    Last Pain:  Vitals:   11/22/15 1404  TempSrc: Oral  PainSc:       Patients Stated Pain Goal: 3 ([id number])  Complications: No apparent anesthesia complications

## 2015-11-22 NOTE — Progress Notes (Signed)
Called by Dr Mancel Bale to be available to assist and potentially co-operate on this pt. Concerned that she is not progressing 10 days after lap BSO. Still having n/v but having BMs. No flatus. Not tolerating a diet.   Pt seen and examined.  Chart/imaging reviewed Alert nad Resting comfortably.  Soft, obese, no rebound/guarding. A little distended. Incisions c/d/i  I think it is reasonable to do dx laparoscopy to rule out trocar site hernia, or psbo since she is 10 days out and not fully recovered. Fortunately, she is not tachycardic, febrile, elevated wbc, or concerning abd exam.   I discussed the procedure in detail.   We discussed the risks and benefits of surgery including, but not limited to bleeding, infection (such as wound infection, abdominal abscess), injury to surrounding structures, blood clot formation, urinary retention, incisional hernia, potential anastomotic stricture, anastomotic leak, anesthesia risks, pulmonary & cardiac complications, need for additional procedures, ileus, & prolonged hospitalization.  We discussed the typical postoperative recovery course, including limitations & restrictions postoperatively. I explained that the likelihood of improvement in their symptoms is good.  Leighton Ruff. Redmond Pulling, MD, FACS General, Bariatric, & Minimally Invasive Surgery [facility], [place]

## 2015-11-22 NOTE — Anesthesia Procedure Notes (Signed)
Procedure Name: Intubation Date/Time: 11/22/2015 3:49 PM Performed by: Hewitt Blade Pre-anesthesia Checklist: Patient identified, Emergency Drugs available, Suction available and Patient being monitored Patient Re-evaluated:Patient Re-evaluated prior to inductionOxygen Delivery Method: Circle system utilized Preoxygenation: Pre-oxygenation with 100% oxygen Intubation Type: IV induction, Rapid sequence and Cricoid Pressure applied Ventilation: Unable to mask ventilate Laryngoscope Size: Glidescope and 3 Grade View: Grade I Tube type: Oral Tube size: 7.0 mm Number of attempts: 1 Airway Equipment and Method: Rigid stylet and Video-laryngoscopy Placement Confirmation: ETT inserted through vocal cords under direct vision,  positive ETCO2 and breath sounds checked- equal and bilateral Secured at: 22 cm Tube secured with: Tape Dental Injury: Teeth and Oropharynx as per pre-operative assessment

## 2015-11-22 NOTE — Anesthesia Preprocedure Evaluation (Signed)
Anesthesia Evaluation  Patient identified by MRN, date of birth, ID band Patient awake    Reviewed: Allergy & Precautions, H&P , NPO status , Patient's Chart, lab work & pertinent test results  History of Anesthesia Complications Negative for: history of anesthetic complications  Airway Mallampati: I  TM Distance: >3 FB Neck ROM: full    Dental no notable dental hx. (+) Teeth Intact, Dental Advisory Given   Pulmonary neg pulmonary ROS, former smoker,    Pulmonary exam normal breath sounds clear to auscultation       Cardiovascular hypertension, Pt. on medications Normal cardiovascular exam Rhythm:regular Rate:Normal     Neuro/Psych 2005 had cerebral aneurysm clipping via craniotomy    GI/Hepatic negative GI ROS, Neg liver ROS, GERD  Medicated and Controlled,  Endo/Other  negative endocrine ROSHypothyroidism took thyroid rx this a.m.  Renal/GU negative Renal ROS     Musculoskeletal   Abdominal   Peds  Hematology negative hematology ROS (+)   Anesthesia Other Findings Denies cardiac symptoms or history Partial dental appliance out to husband  Reproductive/Obstetrics negative OB ROS                             Anesthesia Physical  Anesthesia Plan  ASA: II and emergent  Anesthesia Plan: General   Post-op Pain Management:    Induction: Intravenous, Rapid sequence and Cricoid pressure planned  Airway Management Planned: Oral ETT and Video Laryngoscope Planned  Additional Equipment:   Intra-op Plan:   Post-operative Plan:   Informed Consent: I have reviewed the patients History and Physical, chart, labs and discussed the procedure including the risks, benefits and alternatives for the proposed anesthesia with the patient or authorized representative who has indicated his/her understanding and acceptance.   Dental Advisory Given  Plan Discussed with: Anesthesiologist, CRNA and  Surgeon  Anesthesia Plan Comments:         Anesthesia Quick Evaluation

## 2015-11-22 NOTE — OR Nursing (Signed)
Pt in SSU per stretcher, Comfortrable with no pain,. PIV is patent in right upper arm. Patent . D5W1/2 NS with 20 meq KCL running at 125cc/hour. . No draining incisions noted. Kristine Royal, RN

## 2015-11-22 NOTE — Progress Notes (Signed)
Pt to O.R. Per stretcher. Vitals are stable. Denies any pain or discomfort.

## 2015-11-23 LAB — COMPREHENSIVE METABOLIC PANEL
ALT: 13 U/L — ABNORMAL LOW (ref 14–54)
AST: 19 U/L (ref 15–41)
Albumin: 2.8 g/dL — ABNORMAL LOW (ref 3.5–5.0)
Alkaline Phosphatase: 41 U/L (ref 38–126)
Anion gap: 3 — ABNORMAL LOW (ref 5–15)
BUN: <5 mg/dL — ABNORMAL LOW (ref 6–20)
CO2: 22 mmol/L (ref 22–32)
Calcium: 7.7 mg/dL — ABNORMAL LOW (ref 8.9–10.3)
Chloride: 113 mmol/L — ABNORMAL HIGH (ref 101–111)
Creatinine, Ser: 0.97 mg/dL (ref 0.44–1.00)
GFR calc Af Amer: >60 mL/min (ref >60)
GFR calc non Af Amer: >60 mL/min (ref >60)
Glucose, Bld: 107 mg/dL — ABNORMAL HIGH (ref 65–99)
Potassium: 4.2 mmol/L (ref 3.5–5.1)
Sodium: 138 mmol/L (ref 135–145)
Total Bilirubin: 0.4 mg/dL (ref 0.3–1.2)
Total Protein: 5.8 g/dL — ABNORMAL LOW (ref 6.5–8.1)

## 2015-11-23 LAB — CBC
HCT: 30.7 % — ABNORMAL LOW (ref 36.0–46.0)
Hemoglobin: 9.5 g/dL — ABNORMAL LOW (ref 12.0–15.0)
MCH: 27.3 pg (ref 26.0–34.0)
MCHC: 30.9 g/dL (ref 30.0–36.0)
MCV: 88.2 fL (ref 78.0–100.0)
Platelets: 232 10*3/uL (ref 150–400)
RBC: 3.48 MIL/uL — ABNORMAL LOW (ref 3.87–5.11)
RDW: 17.9 % — ABNORMAL HIGH (ref 11.5–15.5)
WBC: 10.9 10*3/uL — ABNORMAL HIGH (ref 4.0–10.5)

## 2015-11-23 MED ORDER — HYDROMORPHONE HCL 1 MG/ML IJ SOLN
0.5000 mg | INTRAMUSCULAR | Status: DC | PRN
  Administered 2015-11-23 – 2015-11-24 (×3): 0.5 mg via INTRAVENOUS
  Filled 2015-11-23 (×4): qty 1

## 2015-11-23 MED ORDER — OXYCODONE HCL 5 MG PO TABS
5.0000 mg | ORAL_TABLET | ORAL | Status: DC | PRN
  Administered 2015-11-25: 5 mg via ORAL
  Filled 2015-11-23: qty 1

## 2015-11-23 MED ORDER — KETOROLAC TROMETHAMINE 30 MG/ML IJ SOLN
30.0000 mg | Freq: Three times a day (TID) | INTRAMUSCULAR | Status: DC
  Administered 2015-11-23 – 2015-11-24 (×3): 30 mg via INTRAVENOUS
  Filled 2015-11-23 (×3): qty 1

## 2015-11-23 NOTE — Op Note (Signed)
NAMETABETHA, Rachel [name]              ACCOUNT NO.:  [id number]  MEDICAL RECORD NO.:  [id number]  LOCATION:  9303                          FACILITY:  [facility]  PHYSICIAN:  Leighton Ruff. Redmond Pulling, MD, FACSDATE OF BIRTH:  10/25/1950  DATE OF PROCEDURE:  11/22/2015 DATE OF DISCHARGE:                              OPERATIVE REPORT   PREOPERATIVE DIAGNOSIS:  Vomiting and watery stool, possible trocar site hernia status post laparoscopic bilateral salpingo-oophorectomy.  POSTOPERATIVE DIAGNOSIS:  Incarcerated trocar site hernia.  PROCEDURE:  Diagnostic laparoscopy with reduction of incarcerated small bowel, laparoscopic repair of trocar (incisional) hernia primarily,  laparoscopic repair of serosal tear.  SURGEON:  Leighton Ruff. Redmond Pulling, MD, FACS.  Assistant SURGEON:  Everett Graff, M.D.  ANESTHESIA:  General.  ESTIMATED BLOOD LOSS:  Minimal.  SPECIMEN:  None.  FINDINGS:  The patient had incarcerated small bowel hernia in her left lower quadrant 5 mm trocar site.  The bowel was viable, however, there was a serosal tear as a result of it being incarcerated.  This was repaired in a Lembert fashion with 5 interrupted 3-0 silk sutures.  INDICATIONS FOR PROCEDURE:  The patient is a 65 year old female who underwent laparoscopic bilateral salpingo-oophorectomy about 10 days ago by Dr. Mancel Bale.  Her initial postoperative course was unremarkable; however, she developed nausea and vomiting and was readmitted.  CT scan was performed, which was concerning for obstruction versus ileus.  She was initially managed nonsurgically.  She had no fever, tachycardia. She had an elevated white blood cell count.  She had no guarding or peritonitis on exam.  She was followed for the next several days.  She did have some resumption of bowel movements, however, they were very watery, but she continued to have intermittent emesis.  Although her plain films were improved yesterday, there were still a few minimally dilated loops  of bowel and she was not tolerating a diet well.  Dr. Mancel Bale felt that she needs her back to the operating room for laparoscopy.  She requested General Surgery assist in the case, bowel was the primary issue.  I met with the patient preoperatively and discussed the risks and benefits of the procedure at length.  Please see my note for additional information.  DESCRIPTION OF PROCEDURE:  She was taken to the OR 4 at [facility], placed supine on the OR table.  General endotracheal anesthesia was established.  She was placed in lithotomy position with the appropriate padding and Foley catheter was placed sterilely by Dr. Mancel Bale.  Her old Dermabond was peeled off her trocar sites from her recent surgery.  Her abdomen was prepped and draped in the usual standard surgical fashion with ChloraPrep.  She received 2 g of Ancef prior to skin incision.  A surgical time-out was performed.  Dr. Mancel Bale obtained access to the abdomen via Hasson technique in the infraumbilical position through recent old incision.  Pneumoperitoneum was smoothly established to a patient pressure of 15 mmHg.  The laparoscope was inserted.  In the left lower quadrant, there was obvious bowel herniating through her trocar site that was a 5-mm trocar site at her index surgery.  The proximal bowel was viable, it was nondilated.  A 5 mm  trocar was placed in the right lower quadrant under direct visualization through the recent incision.  I then reduced the bowel laparoscopically.  It was viable, however, there was a serosal tear due to the incarceration.  It was not full thickness.  We went ahead and decided to go ahead and close the trocar site hernia.  This was done with a single interrupted 0 Vicryl using an Endo Close suture.  A 5 mm trocar was then placed in the suprapubic incision under direct visualization.  We inspected the bowel.  Although the serosal tear was not full thickness, we felt it needed to be  repaired with interrupted sutures.  I felt I could do this laparoscopically.  The serosal tear was repaired with 5 interrupted 3-0 silk sutures using a laparoscopic needle drivers.  There was excellent coverage of the serosal tear.  The bowel was not strictured.  I felt it did not need to be resected because of the surrounding bowel was intact with no hyperemia or signs of ischemia. It appeared well vascularized.  At this point, we removed the 5 mm trocar in the suprapubic location and closed that trocar site with an interrupted 0 Vicryl using an Endo Close device.  The Hasson trocar was removed and the previously placed pursestring was tied down, thus closing the defect, however, there was still a palpable gap.  It was closed with 3 interrupted 0 Vicryl, 1 with Endo Close and the other 2 using the needle driver.  It was reinspected laparoscopically.  There was no additional air leaks and palpable defect was no longer there. The pneumoperitoneum was released.  Dr. Mancel Bale then closed the skin incisions with Monocryl followed by sterile dressing.  The patient was extubated and taken to the recovery room in stable condition.  All needle, instrument, and sponge counts were correct x2.  There were no immediate complications.  The patient tolerated the procedure well.     Leighton Ruff. Redmond Pulling, MD, FACS     EMW/MEDQ  D:  11/22/2015  T:  11/23/2015  Job:  [id number]

## 2015-11-23 NOTE — Progress Notes (Signed)
1 Day Post-Op  Subjective: Pt feeling better  Objective: Vital signs in last 24 hours: Temp:  [97.7 F (36.5 C)-98.6 F (37 C)] 97.7 F (36.5 C) (07/29 0959) Pulse Rate:  [65-86] 65 (07/29 0959) Resp:  [11-19] 14 (07/29 0959) BP: (105-145)/(68-99) 118/75 (07/29 0959) SpO2:  [92 %-100 %] 99 % (07/29 0959)   Intake/Output from previous day: 07/28 0701 - 07/29 0700 In: 3072.9 [I.V.:3072.9] Out: 2605 [Urine:2075; Stool:525; Blood:5] Intake/Output this shift: No intake/output data recorded.   General appearance: alert and cooperative GI: normal findings: soft  Incision: no significant drainage  Lab Results:   Recent Labs  11/22/15 0618 11/23/15 0531  WBC 9.1 10.9*  HGB 9.6* 9.5*  HCT 30.7* 30.7*  PLT 245 232   BMET  Recent Labs  11/22/15 0618 11/23/15 0531  NA 134* 138  K 3.7 4.2  CL 108 113*  CO2 20* 22  GLUCOSE 109* 107*  BUN <5* <5*  CREATININE 1.14* 0.97  CALCIUM 8.0* 7.7*   PT/INR No results for input(s): LABPROT, INR in the last 72 hours. ABG No results for input(s): PHART, HCO3 in the last 72 hours.  Invalid input(s): PCO2, PO2  MEDS, Scheduled . enoxaparin (LOVENOX) injection  40 mg Subcutaneous Q24H  . famotidine (PEPCID) IV  20 mg Intravenous Q12H  . HYDROmorphone   Intravenous Q4H  . ketorolac  30 mg Intravenous Q8H  . pantoprazole  40 mg Oral QODAY    Studies/Results: No results found.  Assessment: s/p Procedure(s): LAPAROSCOPY DIAGNOSTIC with repair of serosal tear Patient Active Problem List   Diagnosis Date Noted  . Small bowel obstruction ([place]) 11/19/2015  . Nausea with vomiting 11/18/2015  . S/P vaginal hysterectomy 11/12/2015  . Unspecified essential hypertension 04/03/2013    Doing well  Plan: cont npo today.  can probaly start to advance diet tom, if she continues to do well   LOS: 5 days     .Rosario Adie, [facility], [place]   11/23/2015 12:06 PM

## 2015-11-23 NOTE — Plan of Care (Signed)
Problem: Respiratory: Goal: Ability to maintain adequate ventilation will improve Outcome: Completed/Met Date Met: 11/23/15 Lower bases both lungs a little diminished continue I/S while awake.

## 2015-11-23 NOTE — Anesthesia Postprocedure Evaluation (Signed)
Anesthesia Post Note  Patient: Rachel [name]  Procedure(s) Performed: Procedure(s) (LRB): LAPAROSCOPY DIAGNOSTIC with repair of serosal tear (N/A)  Patient location during evaluation: [facility] Anesthesia Type: General Level of consciousness: awake and alert Pain management: satisfactory to patient Vital Signs Assessment: post-procedure vital signs reviewed and stable Respiratory status: spontaneous breathing Cardiovascular status: stable Postop Assessment: no signs of nausea or vomiting     Last Vitals:  Vitals:   11/23/15 0339 11/23/15 0515  BP: 106/81 122/68  Pulse: 73 66  Resp: 18 15  Temp: 36.6 C 36.6 C    Last Pain:  Vitals:   11/23/15 0653  TempSrc:   PainSc: 3    Pain Goal: Patients Stated Pain Goal: 3 (11/23/15 0653)               Rico Sheehan

## 2015-11-23 NOTE — Plan of Care (Signed)
Problem: Urinary Elimination: Goal: Ability to reestablish a normal urinary elimination pattern will improve Outcome: Completed/Met Date Met: 11/23/15 Voiding qs urine without any difficulty.

## 2015-11-23 NOTE — Progress Notes (Addendum)
1 Day Post-Op Procedure(s) (LRB): LAPAROSCOPY DIAGNOSTIC with repair of serosal tear (N/A)  Subjective: Patient reports pain at the midline suprapubic site.  No N/V/flatus.  She has ambulated.  She feels a little bloated.    Objective: I have reviewed patient's vital signs and intake and output. 96% O2 sat on RA  General: alert Resp: clear to auscultation bilaterally Cardio: regular rate and rhythm GI: soft, ND, app tender, faint decreased BS, no rebound or guarding Extremities: no calf tenderness, SCDs are on  Assessment: s/p Procedure(s): LAPAROSCOPY DIAGNOSTIC with repair of serosal tear (N/A): stable  Plan: Continue NPO for now until improved BS  Encourage ambulation and IS Restart DVT prophylaxis Labs reviewed D/C PCA at 2pm Start toradol and IV dilaudid push prn Labs in am    LOS: 5 days    Alanii Ramer Y 11/23/2015, 9:57 AM

## 2015-11-24 LAB — CBC WITH DIFFERENTIAL/PLATELET
Basophils Absolute: 0 10*3/uL (ref 0.0–0.1)
Basophils Relative: 0 %
Eosinophils Absolute: 0.3 10*3/uL (ref 0.0–0.7)
Eosinophils Relative: 3 %
HCT: 28.7 % — ABNORMAL LOW (ref 36.0–46.0)
Hemoglobin: 8.8 g/dL — ABNORMAL LOW (ref 12.0–15.0)
Lymphocytes Relative: 26 %
Lymphs Abs: 2 10*3/uL (ref 0.7–4.0)
MCH: 27.5 pg (ref 26.0–34.0)
MCHC: 30.7 g/dL (ref 30.0–36.0)
MCV: 89.7 fL (ref 78.0–100.0)
Monocytes Absolute: 0.6 10*3/uL (ref 0.1–1.0)
Monocytes Relative: 8 %
Neutro Abs: 4.9 10*3/uL (ref 1.7–7.7)
Neutrophils Relative %: 63 %
Platelets: 198 10*3/uL (ref 150–400)
RBC: 3.2 MIL/uL — ABNORMAL LOW (ref 3.87–5.11)
RDW: 18.5 % — ABNORMAL HIGH (ref 11.5–15.5)
WBC: 7.9 10*3/uL (ref 4.0–10.5)

## 2015-11-24 LAB — COMPREHENSIVE METABOLIC PANEL
ALT: 10 U/L — ABNORMAL LOW (ref 14–54)
AST: 33 U/L (ref 15–41)
Albumin: 2.5 g/dL — ABNORMAL LOW (ref 3.5–5.0)
Alkaline Phosphatase: 37 U/L — ABNORMAL LOW (ref 38–126)
Anion gap: 4 — ABNORMAL LOW (ref 5–15)
BUN: <5 mg/dL — ABNORMAL LOW (ref 6–20)
CO2: 19 mmol/L — ABNORMAL LOW (ref 22–32)
Calcium: 7.2 mg/dL — ABNORMAL LOW (ref 8.9–10.3)
Chloride: 114 mmol/L — ABNORMAL HIGH (ref 101–111)
Creatinine, Ser: 0.95 mg/dL (ref 0.44–1.00)
GFR calc Af Amer: >60 mL/min (ref >60)
GFR calc non Af Amer: >60 mL/min (ref >60)
Glucose, Bld: 105 mg/dL — ABNORMAL HIGH (ref 65–99)
Potassium: 4.5 mmol/L (ref 3.5–5.1)
Sodium: 137 mmol/L (ref 135–145)
Total Bilirubin: 1.3 mg/dL — ABNORMAL HIGH (ref 0.3–1.2)
Total Protein: 5.1 g/dL — ABNORMAL LOW (ref 6.5–8.1)

## 2015-11-24 MED ORDER — DEXTROSE-NACL 5-0.45 % IV SOLN
INTRAVENOUS | Status: DC
  Administered 2015-11-24 – 2015-11-25 (×2): via INTRAVENOUS

## 2015-11-24 NOTE — Progress Notes (Signed)
2 Days Post-Op  Subjective: Pt feeling better again today, having flatus  Objective: Vital signs in last 24 hours: Temp:  [97.8 F (36.6 C)-98.9 F (37.2 C)] 97.9 F (36.6 C) (07/30 0900) Pulse Rate:  [56-93] 56 (07/30 0900) Resp:  [14-18] 18 (07/30 0900) BP: (86-106)/(55-72) 100/63 (07/30 0900) SpO2:  [90 %-97 %] 97 % (07/30 0900)   Intake/Output from previous day: 07/29 0701 - 07/30 0700 In: 2056.3 [I.V.:2006.3; IV Piggyback:50] Out: 1075 [Urine:1075] Intake/Output this shift: Total I/O In: 120 [P.O.:120] Out: 300 [Urine:300]   General appearance: alert and cooperative GI: normal findings: soft  Incision: no significant drainage  Lab Results:   Recent Labs  11/23/15 0531 11/24/15 0501  WBC 10.9* 7.9  HGB 9.5* 8.8*  HCT 30.7* 28.7*  PLT 232 198   BMET  Recent Labs  11/23/15 0531 11/24/15 0501  NA 138 137  K 4.2 4.5  CL 113* 114*  CO2 22 19*  GLUCOSE 107* 105*  BUN <5* <5*  CREATININE 0.97 0.95  CALCIUM 7.7* 7.2*   PT/INR No results for input(s): LABPROT, INR in the last 72 hours. ABG No results for input(s): PHART, HCO3 in the last 72 hours.  Invalid input(s): PCO2, PO2  MEDS, Scheduled . enoxaparin (LOVENOX) injection  40 mg Subcutaneous Q24H  . famotidine (PEPCID) IV  20 mg Intravenous Q12H  . pantoprazole  40 mg Oral QODAY    Studies/Results: No results found.  Assessment: s/p Procedure(s): LAPAROSCOPY DIAGNOSTIC with repair of serosal tear Patient Active Problem List   Diagnosis Date Noted  . Small bowel obstruction ([place]) 11/19/2015  . Nausea with vomiting 11/18/2015  . S/P vaginal hysterectomy 11/12/2015  . Unspecified essential hypertension 04/03/2013    Doing well  Plan: Diet advanced Pt doing well.  Will be available if needed    LOS: 6 days     .Rosario Adie, [facility], [place] [phone]   11/24/2015 11:05 AM

## 2015-11-24 NOTE — Progress Notes (Signed)
Subjective: Patient reports + flatus and no problems voiding.  Ambulating without difficulty.  Objective: I have reviewed patient's vital signs and intake and output.  General: alert and no distress Resp: clear to auscultation bilaterally Cardio: regular rate and rhythm GI: soft, app tender, + BS slightly decreased, ND Extremities: no calf tenderness, SCDs are on   Assessment/Plan: POD#2 improving gradually Trial of clears today and if tolerated will advance to regular tomorrow Labs in the morning Toradol d/c'd, change to PO pain meds Encourage IS and ambulation   LOS: 6 days    Elanora Quin Y 11/24/2015, 9:29 AM

## 2015-11-25 LAB — COMPREHENSIVE METABOLIC PANEL
ALT: 15 U/L (ref 14–54)
AST: 24 U/L (ref 15–41)
Albumin: 2.5 g/dL — ABNORMAL LOW (ref 3.5–5.0)
Alkaline Phosphatase: 38 U/L (ref 38–126)
Anion gap: 6 (ref 5–15)
BUN: <5 mg/dL — ABNORMAL LOW (ref 6–20)
CO2: 18 mmol/L — ABNORMAL LOW (ref 22–32)
Calcium: 7.4 mg/dL — ABNORMAL LOW (ref 8.9–10.3)
Chloride: 116 mmol/L — ABNORMAL HIGH (ref 101–111)
Creatinine, Ser: 0.96 mg/dL (ref 0.44–1.00)
GFR calc Af Amer: >60 mL/min (ref >60)
GFR calc non Af Amer: >60 mL/min (ref >60)
Glucose, Bld: 92 mg/dL (ref 65–99)
Potassium: 3.3 mmol/L — ABNORMAL LOW (ref 3.5–5.1)
Sodium: 140 mmol/L (ref 135–145)
Total Bilirubin: 0.6 mg/dL (ref 0.3–1.2)
Total Protein: 5.1 g/dL — ABNORMAL LOW (ref 6.5–8.1)

## 2015-11-25 LAB — CBC WITH DIFFERENTIAL/PLATELET
Basophils Absolute: 0 10*3/uL (ref 0.0–0.1)
Basophils Relative: 0 %
Eosinophils Absolute: 0.3 10*3/uL (ref 0.0–0.7)
Eosinophils Relative: 4 %
HCT: 29 % — ABNORMAL LOW (ref 36.0–46.0)
Hemoglobin: 9 g/dL — ABNORMAL LOW (ref 12.0–15.0)
Lymphocytes Relative: 29 %
Lymphs Abs: 1.9 10*3/uL (ref 0.7–4.0)
MCH: 27.3 pg (ref 26.0–34.0)
MCHC: 31 g/dL (ref 30.0–36.0)
MCV: 87.9 fL (ref 78.0–100.0)
Monocytes Absolute: 0.4 10*3/uL (ref 0.1–1.0)
Monocytes Relative: 6 %
Neutro Abs: 4 10*3/uL (ref 1.7–7.7)
Neutrophils Relative %: 61 %
Platelets: 236 10*3/uL (ref 150–400)
RBC: 3.3 MIL/uL — ABNORMAL LOW (ref 3.87–5.11)
RDW: 18.6 % — ABNORMAL HIGH (ref 11.5–15.5)
WBC: 6.7 10*3/uL (ref 4.0–10.5)

## 2015-11-25 MED ORDER — PANTOPRAZOLE SODIUM 40 MG PO TBEC
40.0000 mg | DELAYED_RELEASE_TABLET | ORAL | Status: DC
  Administered 2015-11-25: 40 mg via ORAL
  Filled 2015-11-25: qty 1

## 2015-11-25 MED ORDER — POTASSIUM CHLORIDE 10 MEQ/100ML IV SOLN
10.0000 meq | INTRAVENOUS | Status: DC
Start: 2015-11-25 — End: 2015-11-25
  Administered 2015-11-25: 10 meq via INTRAVENOUS
  Filled 2015-11-25 (×4): qty 100

## 2015-11-25 MED ORDER — LISINOPRIL 10 MG PO TABS
10.0000 mg | ORAL_TABLET | Freq: Every day | ORAL | Status: DC
  Administered 2015-11-25 – 2015-11-26 (×2): 10 mg via ORAL
  Filled 2015-11-25 (×2): qty 1

## 2015-11-25 MED ORDER — KCL IN DEXTROSE-NACL 20-5-0.45 MEQ/L-%-% IV SOLN
INTRAVENOUS | Status: DC
  Filled 2015-11-25: qty 1000

## 2015-11-25 MED ORDER — POTASSIUM CHLORIDE CRYS ER 20 MEQ PO TBCR
20.0000 meq | EXTENDED_RELEASE_TABLET | Freq: Two times a day (BID) | ORAL | Status: DC
  Administered 2015-11-25 – 2015-11-26 (×3): 20 meq via ORAL
  Filled 2015-11-25 (×3): qty 1

## 2015-11-25 MED ORDER — HYDROCHLOROTHIAZIDE 12.5 MG PO CAPS
12.5000 mg | ORAL_CAPSULE | Freq: Every day | ORAL | Status: DC
  Administered 2015-11-25 – 2015-11-26 (×2): 12.5 mg via ORAL
  Filled 2015-11-25 (×2): qty 1

## 2015-11-25 NOTE — Progress Notes (Signed)
Rachel [name] is a64 y.o.  [id number]  Post Op Date #3: Dx L/S /  #13 L/S BSO and Hospital Day #8  Subjective: Patient is Doing well postoperatively. Only reports mild surgical site pain rated 4/10 on a 10 point pain scale.  Patient has tolerated clear liquid diet, ambulating without dizziness, voiding without difficulty and is passing flatus.  Reports a dark green watery stool.   Objective: Vital signs in last 24 hours: Temp:  [97.9 F (36.6 C)-98.7 F (37.1 C)] 98.7 F (37.1 C) (07/31 0508) Pulse Rate:  [55-79] 57 (07/31 0508) Resp:  [16-18] 18 (07/31 0508) BP: (100-143)/(62-80) 125/65 (07/31 0508) SpO2:  [96 %-100 %] 98 % (07/31 0508)  Intake/Output from previous day: 07/30 0701 - 07/31 0700 In: 5194.1 [P.O.:1622; I.V.:3472.1] Out: 2577 [Urine:2575] Intake/Output this shift: No intake/output data recorded.  Recent Labs Lab 11/23/15 0531 11/24/15 0501 11/25/15 0508  WBC 10.9* 7.9 6.7  HGB 9.5* 8.8* 9.0*  HCT 30.7* 28.7* 29.0*  PLT 232 198 236     Recent Labs Lab 11/23/15 0531 11/24/15 0501 11/25/15 0508  NA 138 137 140  K 4.2 4.5 3.3*  CL 113* 114* 116*  CO2 22 19* 18*  BUN <5* <5* <5*  CREATININE 0.97 0.95 0.96  CALCIUM 7.7* 7.2* 7.4*  PROT 5.8* 5.1* 5.1*  BILITOT 0.4 1.3* 0.6  ALKPHOS 41 37* 38  ALT 13* 10* 15  AST 19 33 24  GLUCOSE 107* 105* 92    EXAM: General: alert, cooperative and no distress Resp: clear to auscultation bilaterally Cardio: regular rate and rhythm, S1, S2 normal, no murmur, click, rub or gallop GI: Bowel sounds present, soft, incision site dressings are clean/dry/intact Extremities: Homans sign is negative, no sign of DVT and no calf tenderness.   Assessment: s/p Procedure(s): LAPAROSCOPY DIAGNOSTIC with repair of serosal tear: stable, progressing well and anemia  Laparoscopic Bilateral Salpingo-oophorectomy-post op day #13 Persistent Vomiting-resolved  Plan: Routine care per Dr. Mancel Bale  May consider advancing diet  and supplementing potassium  LOS: 7 days    POWELL,ELMIRA, PA-C 11/25/2015 7:55 AM  Repleting potassium.  Started on regular diet today.  BP increasing, may need to restart BP med.  + flatus, no formed BMs and good BS throughout.  If continues to do well tomorrow, plan to d/c home.

## 2015-11-25 NOTE — Plan of Care (Signed)
Problem: Bowel/Gastric: Goal: Gastrointestinal status for postoperative course will improve Outcome: Completed/Met Date Met: 11/25/15 Positive bowel sounds.Has passed flatus twice after MN and had a small soft BM.

## 2015-11-26 LAB — COMPREHENSIVE METABOLIC PANEL
ALT: 14 U/L (ref 14–54)
AST: 18 U/L (ref 15–41)
Albumin: 2.5 g/dL — ABNORMAL LOW (ref 3.5–5.0)
Alkaline Phosphatase: 42 U/L (ref 38–126)
Anion gap: 3 — ABNORMAL LOW (ref 5–15)
BUN: 6 mg/dL (ref 6–20)
CO2: 21 mmol/L — ABNORMAL LOW (ref 22–32)
Calcium: 7.9 mg/dL — ABNORMAL LOW (ref 8.9–10.3)
Chloride: 115 mmol/L — ABNORMAL HIGH (ref 101–111)
Creatinine, Ser: 1.06 mg/dL — ABNORMAL HIGH (ref 0.44–1.00)
GFR calc Af Amer: >60 mL/min (ref >60)
GFR calc non Af Amer: 54 mL/min — ABNORMAL LOW (ref >60)
Glucose, Bld: 89 mg/dL (ref 65–99)
Potassium: 3.6 mmol/L (ref 3.5–5.1)
Sodium: 139 mmol/L (ref 135–145)
Total Bilirubin: 0.8 mg/dL (ref 0.3–1.2)
Total Protein: 5.2 g/dL — ABNORMAL LOW (ref 6.5–8.1)

## 2015-11-26 LAB — TYPE AND SCREEN
ABO/RH(D): A POS
Antibody Screen: NEGATIVE
Unit division: 0
Unit division: 0

## 2015-11-26 LAB — CBC WITH DIFFERENTIAL/PLATELET
Basophils Absolute: 0 10*3/uL (ref 0.0–0.1)
Basophils Relative: 0 %
Eosinophils Absolute: 0.2 10*3/uL (ref 0.0–0.7)
Eosinophils Relative: 3 %
HCT: 29 % — ABNORMAL LOW (ref 36.0–46.0)
Hemoglobin: 9 g/dL — ABNORMAL LOW (ref 12.0–15.0)
Lymphocytes Relative: 27 %
Lymphs Abs: 2.1 10*3/uL (ref 0.7–4.0)
MCH: 27.1 pg (ref 26.0–34.0)
MCHC: 31 g/dL (ref 30.0–36.0)
MCV: 87.3 fL (ref 78.0–100.0)
Monocytes Absolute: 0.7 10*3/uL (ref 0.1–1.0)
Monocytes Relative: 9 %
Neutro Abs: 4.8 10*3/uL (ref 1.7–7.7)
Neutrophils Relative %: 61 %
Platelets: 271 10*3/uL (ref 150–400)
RBC: 3.32 MIL/uL — ABNORMAL LOW (ref 3.87–5.11)
RDW: 18.7 % — ABNORMAL HIGH (ref 11.5–15.5)
WBC: 7.9 10*3/uL (ref 4.0–10.5)

## 2015-11-26 NOTE — Progress Notes (Signed)
Rachel [name] is a39 y.o.  [id number]  Post Op Date #4: Dx L/S with bowel serosal repair /  #14 L/S BSO and Hospital Day #9  Subjective: Patient is Doing well postoperatively. Has had a semi-solid formed stool today,  tolerating a regular diet, passiting flatus, minimal pain, amubulating and voiding without difficulty.   Objective: Vital signs in last 24 hours: Temp:  [97.9 F (36.6 C)-99 F (37.2 C)] 99 F (37.2 C) (08/01 0545) Pulse Rate:  [53-72] 61 (08/01 0545) Resp:  [16-18] 18 (08/01 0545) BP: (112-150)/(58-78) 112/68 (08/01 0545) SpO2:  [96 %-98 %] 97 % (08/01 0545)  Intake/Output from previous day: 07/31 0701 - 08/01 0700 In: 1060 [P.O.:1060] Out: 3150 [Urine:3150] Intake/Output this shift: No intake/output data recorded.  Recent Labs Lab 11/24/15 0501 11/25/15 0508 11/26/15 0521  WBC 7.9 6.7 7.9  HGB 8.8* 9.0* 9.0*  HCT 28.7* 29.0* 29.0*  PLT 198 236 271     Recent Labs Lab 11/24/15 0501 11/25/15 0508 11/26/15 0521  NA 137 140 139  K 4.5 3.3* 3.6  CL 114* 116* 115*  CO2 19* 18* 21*  BUN <5* <5* 6  CREATININE 0.95 0.96 1.06*  CALCIUM 7.2* 7.4* 7.9*  PROT 5.1* 5.1* 5.2*  BILITOT 1.3* 0.6 0.8  ALKPHOS 37* 38 42  ALT 10* 15 14  AST 33 24 18  GLUCOSE 105* 92 89    EXAM: General: alert, cooperative and no distress Resp: clear to auscultation bilaterally Cardio: regular rate and rhythm, S1, S2 normal, no murmur, click, rub or gallop GI: soft, non-tender; bowel sounds normal; no masses,  no organomegaly Extremities: no calf tenderness.   Assessment: s/p Procedure(s): LAPAROSCOPY DIAGNOSTIC with repair of serosal tear: stable, progressing well, tolerating diet and anemia Post Op Day #4 Laparoscopic Bilateral Salpingo-oophorectomy Post Op Day # 14  Plan: Discharge home  LOS: 8 days    Lawanna Cecere, PA-C 11/26/2015 7:58 AM

## 2015-11-26 NOTE — Discharge Instructions (Signed)
Call [facility] @ [phone] if:  You have a temperature greater than or equal to 100.4 degrees Farenheit orally You have pain that is not made better by the pain medication given and taken as directed You have excessive bleeding or problems urinating  Take Colace (Docusate Sodium/Stool Softener) 100 mg 2-3 times daily while taking narcotic pain medicine to avoid constipation or until bowel movements are regular. Take over the counter iron supplementation of your choice twice daily  Take your previously prescribed pain medication  (Oxycodone-Acetaminophen 5/325 mg)  as needed for pain  You may drive after 2  weeks You may walk up steps  You may shower  You may resume a regular diet  Keep incisions clean and dry and remove honeycomb dressing on November 29, 2015 Do not lift over 15 pounds for 6 weeks Avoid anything in vagina  until after your post-operative visit with Dr. Mancel Bale on December 17, 2015

## 2015-11-26 NOTE — Discharge Summary (Signed)
Physician Discharge Summary  Patient ID: Rachel [name] MRN: [id number] DOB/AGE: [birth year] 65 y.o.  Admit date: 11/18/2015 Discharge date: 11/26/2015   Discharge Diagnoses: Vomiting,  Serosal Bowel Tear and Trocar Site Herniation Active Problems:   Nausea with vomiting   Small bowel obstruction [facility])   Operation: Diagnostic Diagnostic Laparoscopy With Serosal Bowel Repair   Discharged Condition: Good  Hospital Course:  The patient was admitted 6 days post a Laparoscopic Bilateral Salpingo-oophorectomy for persistent vomiting.  A CT scan of the abdomen and pelvis showed a partial bowel obstruction vs ileus.  The patient did not resolve her symptoms after 4 days of supportive care and bowel rest and was therefore returned to the operating room for further evaluation.  The patient was found to have bowel herniation of her left lower quadrant trocar site and a small serosal tear in the bowel. Following this surgical intervention and repair,  the patient quickly resumed bowel and maintained bladder function by post operative day # 3 and was therefore deemed ready for discharge home.  Discharge hemoglobin and hematocrit = 9.0/29.0.  Disposition: 01-Home or Self Care  Discharge Medications:    Medication List    STOP taking these medications   HYDROcodone-acetaminophen 5-325 MG tablet Commonly known as:  NORCO/VICODIN     TAKE these medications   atorvastatin 20 MG tablet Commonly known as:  LIPITOR Take 20 mg by mouth at bedtime.   calcium carbonate 500 MG chewable tablet Commonly known as:  TUMS - dosed in mg elemental calcium Chew 4 tablets by mouth 2 (two) times daily as needed for indigestion or heartburn.   calcium citrate 950 MG tablet Commonly known as:  CALCITRATE - dosed in mg elemental calcium Take 200 mg of elemental calcium by mouth daily.   COMBIGAN 0.2-0.5 % ophthalmic solution Generic drug:  brimonidine-timolol Place 1 drop into the right eye 2 (two) times  daily.   hydrochlorothiazide 12.5 MG capsule Commonly known as:  MICROZIDE Take 12.5 mg by mouth daily.   levothyroxine 100 MCG tablet Commonly known as:  SYNTHROID, LEVOTHROID Take 100 mcg by mouth daily before breakfast.   lisinopril 10 MG tablet Commonly known as:  PRINIVIL,ZESTRIL Take 10 mg by mouth daily.   oxyCODONE-acetaminophen 5-325 MG tablet Commonly known as:  PERCOCET/ROXICET Take 1 tablet by mouth every 6 (six) hours as needed for severe pain (moderate to severe pain (when tolerating fluids)).   pantoprazole 40 MG tablet Commonly known as:  PROTONIX Take 40 mg by mouth every other day.   Vitamin D3 5000 units Caps Take 5,000 Units by mouth daily.         Follow-up:  Earnstine Regal, [facility]hal Coria December 03, 2015 at 2:45 p.m. and Dr. Everett Graff  December 17, 2015 at 4:30 p.m.   SignedEarnstine Regal, PA-C 11/26/2015, 8:14 AM

## 2015-11-26 NOTE — Progress Notes (Signed)
Pt  Ambulated out teaching complete   feels fine no  completes

## 2015-11-28 ENCOUNTER — Encounter (HOSPITAL_COMMUNITY): Payer: Self-pay | Admitting: Obstetrics and Gynecology

## 2015-12-16 ENCOUNTER — Encounter: Payer: Self-pay | Admitting: Internal Medicine

## 2015-12-16 ENCOUNTER — Ambulatory Visit (INDEPENDENT_AMBULATORY_CARE_PROVIDER_SITE_OTHER): Payer: Managed Care, Other (non HMO) | Admitting: Internal Medicine

## 2015-12-16 ENCOUNTER — Encounter (INDEPENDENT_AMBULATORY_CARE_PROVIDER_SITE_OTHER): Payer: Self-pay

## 2015-12-16 VITALS — BP 104/60 | HR 96 | Ht 64.0 in | Wt 196.0 lb

## 2015-12-16 DIAGNOSIS — I10 Essential (primary) hypertension: Secondary | ICD-10-CM

## 2015-12-16 DIAGNOSIS — J9601 Acute respiratory failure with hypoxia: Secondary | ICD-10-CM

## 2015-12-16 MED ORDER — VALSARTAN 80 MG PO TABS: 80.0000 mg | ORAL_TABLET | Freq: Every day | ORAL | 11 refills | Status: DC

## 2015-12-16 NOTE — Progress Notes (Signed)
Subjective:     Patient ID: Rachel [name], female   DOB: 03/21/1951,    MRN: [id number]  HPI  83 yobf quit smoking around 2005 wt 160 with mild doe since then but able to do treadmill/gym before ovarian  Surgery 11/11/16 benign cyst > N and V post op > dx serosal tear/ trocar herniation requiring repeat lap repair 11/18/15  and after this surgery noted desats so went home and referred to pulmonary clinic 12/16/2015 by Dr Everett Graff   12/16/2015 1st [facility] office visit/ Aniah Pauli   Chief Complaint  Patient presents with  . Pulmonary Consult    Referred by Dr. Gavin Pound. Pt states she was advised her o2 sats were too low while she was in the hospital for surgery in July 2017. She states her spouse has noticed that she snores and occ gasps for air while she is sleeping.   At baseline did treadmill 3 x 30 min weekly s stopping / 2.29mph and slt tilted but does not know specifics and has not been cleared to ex yet but feels back to baseline and denies am ha or knowledge of noct symptoms which are reported by husband who has osa   No obvious day to day or daytime variability or assoc excess/ purulent sputum or mucus plugs or hemoptysis or cp or chest tightness, subjective wheeze or overt sinus or hb symptoms. No unusual exp hx or h/o childhood pna/ asthma or knowledge of premature birth.  Sleeping ok without nocturnal  or early am exacerbation  of respiratory  c/o's or need for noct saba. Also denies any obvious fluctuation of symptoms with weather or environmental changes or other aggravating or alleviating factors except as outlined above   Current Medications, Allergies, Complete Past Medical History, Past Surgical History, Family History, and Social History were reviewed in [employer] record.  ROS  The following are not active complaints unless bolded sore throat, dysphagia, dental problems, itching, sneezing,  nasal congestion or excess/ purulent secretions,  ear ache,   fever, chills, sweats, unintended wt loss, classically pleuritic or exertional cp,  orthopnea pnd or leg swelling, presyncope, palpitations, abdominal pain, anorexia, nausea, vomiting, diarrhea  or change in bowel or bladder habits, change in stools or urine, dysuria,hematuria,  rash, arthralgias, visual complaints, headache, numbness, weakness or ataxia or problems with walking or coordination,  change in mood/affect or memory.             Review of Systems     Objective:   Physical Exam    amb obese bf nad freq throat clearing   Wt Readings from Last 3 Encounters:  12/16/15 196 lb (88.9 kg)  11/21/15 201 lb (91.2 kg)  11/01/15 201 lb (91.2 kg)    Vital signs reviewed      HEENT: nl dentition, turbinates, and oropharynx. Nl external ear canals without cough reflex - modified malipati =2/3  NECK :  without JVD/Nodes/TM/ nl carotid upstrokes bilaterally   LUNGS: no acc muscle use,  Nl contour chest which is clear to A and P bilaterally without cough on insp or exp maneuvers   CV:  RRR  no s3 or murmur or increase in P2, no edema   ABD:  soft and nontender with nl inspiratory excursion in the supine position. No bruits or organomegaly, bowel sounds nl  MS:  Nl gait/ ext warm without deformities, calf tenderness, cyanosis or clubbing No obvious joint restrictions   SKIN: warm and dry without lesions  NEURO:  alert, approp, nl sensorium with  no motor deficits    I personally reviewed images and agree with radiology impression as follows:  CXR:   11/21/15 Lungs are adequately inflated with stable minimal linear density over the left base and minimal stable density over the right costophrenic angle likely atelectasis. Possible faint nodular density just above the minor fissure not seen on the recent chest radiograph and likely overlapping bony and vascular structures. Cardiomediastinal silhouette is within normal. There is mild calcified plaque over the  aortic arch. Remainder of the chest is unchanged.  Assessment:

## 2015-12-16 NOTE — Patient Instructions (Signed)
Please see patient coordinator before you leave today  to schedule overnight pulse ox and I will call you with the results  Stop lisinopril and start valsartan 80 mg daily   Please schedule a follow up office visit in 6 weeks, call sooner if needed

## 2015-12-17 ENCOUNTER — Encounter: Payer: Self-pay | Admitting: Internal Medicine

## 2015-12-17 NOTE — Assessment & Plan Note (Addendum)
p 2nd ET  / lap October 19 2015  - 12/16/2015  Walked RA x 3 laps @ 185 ft each stopped due to very min sob/ sats 90% at end Spirometry 12/16/2015  FEV1 1.41 (71%)  Ratio 81  - ONO RA 12/16/2015 >>>     Suspect she either aspirated, had some form of ALI or the effects of sedation/obesity/ post op status on abd compliance  causing her desaturation during admit but doubt significant underlying lung dz or osa clinically.  Discussed in detail all the  indications, usual  risks and alternatives  relative to the benefits with patient who agrees to proceed with conservative f/u with screening ono now then formal sleep study if indicated.  Total time devoted to counseling  = 35/70m review case with pt/ discussion of options/alternatives/ personally creating written instructions  in presence of pt  then going over those specific  Instructions directly with the pt including how to use all of the meds but in particular covering each new medication in detail and the difference between the maintenance/automatic meds and the prns using an action plan format for the latter.

## 2015-12-17 NOTE — Assessment & Plan Note (Signed)
In the best review of chronic cough to date ( NEJM 2016 375 [phone]) ,  ACEi are now felt to cause cough in up to  20% of pts which is a 4 fold increase from previous reports and does not include the variety of non-specific complaints we see in pulmonary clinic in pts on ACEi but previously attributed to another dx like  Copd/asthma and  include PNDS, throat and chest congestion, "bronchitis", unexplained dyspnea and noct "strangling" sensations, and hoarseness, but also  atypical /refractory GERD symptoms like dysphagia and "bad heartburn"   Some of what her husband may be describing may be upper airways dysfunction during sleep related to ACEi and she had quite a bit of throat clearing during interview.  The only way I know  to prove this is not an "ACEi Case" is a trial off ACEi x a minimum of 6 weeks then regroup.   Try diovan 80 mg daily.

## 2015-12-17 NOTE — Assessment & Plan Note (Signed)
Body mass index is 33.64 kg/m.  No results found for: TSH   Contributing to risk of osa/ hbp reviewed the need and the process to achieve and maintain neg calorie balance > defer f/u primary care including intermittently monitoring thyroid status

## 2016-01-07 ENCOUNTER — Telehealth: Payer: Self-pay | Admitting: Internal Medicine

## 2016-01-07 DIAGNOSIS — G4734 Idiopathic sleep related nonobstructive alveolar hypoventilation: Secondary | ICD-10-CM

## 2016-01-07 NOTE — Telephone Encounter (Signed)
ONO on RA done by [facility]PS on 12/19/15  Per MW- abnormal, needs 2lpm o2 with sleep and repeat ONO on 2lpm  LMTCB for the pt

## 2016-01-07 NOTE — Telephone Encounter (Signed)
Spoke with pt and gave results and recommendations. She is ok with O2 start. Order placed for O2 and repeat ONO on 2L. Nothing further needed.

## 2016-01-16 ENCOUNTER — Institutional Professional Consult (permissible substitution): Payer: Managed Care, Other (non HMO) | Admitting: Pulmonary Disease

## 2016-01-23 ENCOUNTER — Telehealth: Payer: Self-pay | Admitting: Internal Medicine

## 2016-01-23 NOTE — Telephone Encounter (Signed)
Per MW ONO on 2lpm was normal, needs to continue using 2lpm with sleep  Spoke with pt and notified of results per Dr. Melvyn Novas. Pt verbalized understanding and denied any questions.

## 2016-01-29 ENCOUNTER — Encounter: Payer: Self-pay | Admitting: Internal Medicine

## 2016-01-30 ENCOUNTER — Encounter: Payer: Self-pay | Admitting: Internal Medicine

## 2016-01-30 ENCOUNTER — Ambulatory Visit (INDEPENDENT_AMBULATORY_CARE_PROVIDER_SITE_OTHER): Payer: Managed Care, Other (non HMO) | Admitting: Internal Medicine

## 2016-01-30 VITALS — BP 102/70 | HR 65 | Ht 64.0 in | Wt 196.0 lb

## 2016-01-30 DIAGNOSIS — J9601 Acute respiratory failure with hypoxia: Secondary | ICD-10-CM | POA: Diagnosis not present

## 2016-01-30 DIAGNOSIS — I10 Essential (primary) hypertension: Secondary | ICD-10-CM | POA: Diagnosis not present

## 2016-01-30 MED ORDER — VALSARTAN-HYDROCHLOROTHIAZIDE 80-12.5 MG PO TABS: 1.0000 | ORAL_TABLET | Freq: Every day | ORAL | 3 refills | Status: DC

## 2016-01-30 NOTE — Patient Instructions (Addendum)
Change diovan to 80-12.5 and stop the regular diovan 80 and hydrochlorothaizide 12.5   Weight control is simply a matter of calorie balance which needs to be tilted in your favor by eating less and exercising more.  To get the most out of exercise, you need to be continuously aware that you are short of breath, but never out of breath, for 30 minutes daily. As you improve, it will actually be easier for you to do the same amount of exercise  in  30 minutes so always push to the level where you are short of breath.  If this does not result in gradual weight reduction then I strongly recommend you see a nutritionist with a food diary x 2 weeks so that we can work out a negative calorie balance which is universally effective in steady weight loss programs.  Think of your calorie balance like you do your bank account where in this case you want the balance to go down so you must take in less calories than you burn up.  It's just that simple:  Hard to do, but easy to understand.  Good luck!   Your goal wt is   Around 150   Stay on 02 2lpm at bedtime only   Please schedule a follow up visit in 6 months but call sooner if needed

## 2016-01-30 NOTE — Progress Notes (Signed)
Subjective:     Patient ID: Rachel [name], female   DOB: 1950/10/30,    MRN: [id number]    Brief patient profile:  58 yobf quit smoking around 2005 wt 160 with mild doe since then but able to do treadmill/gym before ovarian  Surgery 11/11/16 benign cyst > N and V post op > dx serosal tear/ trocar herniation requiring repeat lap repair 11/18/15  and after this surgery noted desats so went home and referred to pulmonary clinic 12/16/2015 by Dr Everett Graff    History of Present Illness  12/16/2015 1st [facility] office visit/ Rachel [name]   Chief Complaint  Patient presents with  . Pulmonary Consult    Referred by Dr. Gavin Pound. Pt states she was advised her o2 sats were too low while she was in the hospital for surgery in July 2017. She states her spouse has noticed that she snores and occ gasps for air while she is sleeping.   At baseline did treadmill 3 x 30 min weekly s stopping / 2.55mph and slt tilted but does not know specifics and has not been cleared to ex yet but feels back to baseline and denies am ha or knowledge of noct symptoms which are reported by husband who has osa rec Please see patient coordinator before you leave today  to schedule overnight pulse ox and I will call you with the results Stop lisinopril and start valsartan 80 mg daily     01/30/2016  f/u ov/Byren Pankow re: noct hypoxemia / excess throat clearing on ACEi  Not ex yet/no more cough or noct gasping for air on 02 2lpm  Not limited by breathing from desired activities  But just doing slow adl's so fare   No obvious day to day or daytime variability or assoc excess/ purulent sputum or mucus plugs or hemoptysis or cp or chest tightness, subjective wheeze or overt sinus or hb symptoms. No unusual exp hx or h/o childhood pna/ asthma or knowledge of premature birth.  Sleeping ok without nocturnal  or early am exacerbation  of respiratory  c/o's or need for noct saba. Also denies any obvious fluctuation of symptoms with  weather or environmental changes or other aggravating or alleviating factors except as outlined above   Current Medications, Allergies, Complete Past Medical History, Past Surgical History, Family History, and Social History were reviewed in [employer] record.  ROS  The following are not active complaints unless bolded sore throat, dysphagia, dental problems, itching, sneezing,  nasal congestion or excess/ purulent secretions, ear ache,   fever, chills, sweats, unintended wt loss, classically pleuritic or exertional cp,  orthopnea pnd or leg swelling, presyncope, palpitations, abdominal pain, anorexia, nausea, vomiting, diarrhea  or change in bowel or bladder habits, change in stools or urine, dysuria,hematuria,  rash, arthralgias, visual complaints, headache, numbness, weakness or ataxia or problems with walking or coordination,  change in mood/affect or memory.                  Objective:   Physical Exam   amb obese bf nad - no longer throat clearing       01/30/2016       196   12/16/15 196 lb (88.9 kg)  11/21/15 201 lb (91.2 kg)  11/01/15 201 lb (91.2 kg)    Vital signs reviewed/ note sats 96% on RA on arrival       HEENT: nl dentition, turbinates, and oropharynx. Nl external ear canals without cough reflex - modified  malipati =2/3  NECK :  without JVD/Nodes/TM/ nl carotid upstrokes bilaterally   LUNGS: no acc muscle use,  Nl contour chest which is clear to A and P bilaterally without cough on insp or exp maneuvers   CV:  RRR  no s3 or murmur or increase in P2, no edema   ABD:  soft and nontender with nl inspiratory excursion in the supine position. No bruits or organomegaly, bowel sounds nl  MS:  Nl gait/ ext warm without deformities, calf tenderness, cyanosis or clubbing No obvious joint restrictions   SKIN: warm and dry without lesions    NEURO:  alert, approp, nl sensorium with  no motor deficits    I personally reviewed images and  agree with radiology impression as follows:  CXR:   11/21/15 Lungs are adequately inflated with stable minimal linear density over the left base and minimal stable density over the right costophrenic angle likely atelectasis. Possible faint nodular density just above the minor fissure not seen on the recent chest radiograph and likely overlapping bony and vascular structures. Cardiomediastinal silhouette is within normal. There is mild calcified plaque over the aortic arch. Remainder of the chest is unchanged.  Assessment:

## 2016-01-31 NOTE — Assessment & Plan Note (Signed)
Try off acei 12/16/2015 due to throat clearing > resolved 01/30/2016   Although even in retrospect it may not be clear the ACEi contributed to the pt's symptoms,  Pt improved off them and adding them back at this point or in the future would risk confusion in interpretation of non-specific respiratory symptoms to which this patient is prone  ie  Better not to muddy the waters here.   rec diovan 80-12.5 and d/c hctz to consolidate rx > Follow up per Primary Care planned

## 2016-01-31 NOTE — Assessment & Plan Note (Signed)
p 2nd ET  / lap October 19 2015  - 12/16/2015  Walked RA x 3 laps @ 185 ft each stopped due to very min sob/ sats 90% at end Spirometry 12/16/2015  FEV1 1.41 (71%)  Ratio 81  - ONO RA 12/19/2015 >>>  sats < 89% x 4h 10 min  > rec repeat on 2lpm  01/21/16 > no desat   rec continue 02  2lpm until able to get wt down at least 25 lb then repeat ono RA

## 2016-01-31 NOTE — Assessment & Plan Note (Signed)
Body mass index is 33.64  No results found for: TSH   Contributing to gerd tendency/ doe/reviewed the need and the process to achieve and maintain neg calorie balance > defer f/u primary care including intermittently monitoring thyroid status

## 2016-02-14 ENCOUNTER — Telehealth: Payer: Self-pay | Admitting: Internal Medicine

## 2016-02-14 NOTE — Telephone Encounter (Signed)
Rachel [name] ° °

## 2016-03-27 ENCOUNTER — Other Ambulatory Visit: Payer: Self-pay | Admitting: Obstetrics and Gynecology

## 2016-07-31 ENCOUNTER — Ambulatory Visit: Payer: Managed Care, Other (non HMO) | Admitting: Internal Medicine

## 2016-08-03 ENCOUNTER — Ambulatory Visit: Payer: Managed Care, Other (non HMO) | Admitting: Internal Medicine

## 2016-08-12 ENCOUNTER — Encounter: Payer: Self-pay | Admitting: Internal Medicine

## 2016-08-12 ENCOUNTER — Ambulatory Visit (INDEPENDENT_AMBULATORY_CARE_PROVIDER_SITE_OTHER): Payer: 59 | Admitting: Internal Medicine

## 2016-08-12 ENCOUNTER — Ambulatory Visit (INDEPENDENT_AMBULATORY_CARE_PROVIDER_SITE_OTHER)
Admission: RE | Admit: 2016-08-12 | Discharge: 2016-08-12 | Disposition: A | Discharge status: Home / Self Care | Payer: 59 | Source: Ambulatory Visit | Attending: Internal Medicine | Admitting: Internal Medicine

## 2016-08-12 VITALS — BP 116/68 | HR 88 | Ht 64.0 in | Wt 201.6 lb

## 2016-08-12 DIAGNOSIS — J9601 Acute respiratory failure with hypoxia: Secondary | ICD-10-CM

## 2016-08-12 DIAGNOSIS — I10 Essential (primary) hypertension: Secondary | ICD-10-CM | POA: Diagnosis not present

## 2016-08-12 NOTE — Patient Instructions (Addendum)
Patient coordinator will call to  schedule overnight oximetry on Room air  We will schedule you for full pfts next available and I will call you with results  Please remember to go to the x-ray department downstairs in the basement  for your tests - we will call you with the results when they are available.  Please schedule a follow up office visit in 6 weeks, call sooner if needed

## 2016-08-12 NOTE — Progress Notes (Signed)
Subjective:     Patient ID: Rachel [name], female   DOB: 1950/09/26,    MRN: [id number]    Brief patient profile:  68 yobf quit smoking around 2005 wt 160 with mild doe since then but able to do treadmill/gym before ovarian  Surgery 11/12/15 benign cyst > N and V post op > dx serosal tear/ trocar herniation requiring repeat lap repair 11/18/15  and after this surgery noted desats so went home and referred to pulmonary clinic 12/16/2015 by Dr Everett Graff    History of Present Illness  12/16/2015 1st [facility] office visit/ Rachel [name]   Chief Complaint  Patient presents with  . Pulmonary Consult    Referred by Dr. Gavin Pound. Pt states she was advised her o2 sats were too low while she was in the hospital for surgery in July 2017. She states her spouse has noticed that she snores and occ gasps for air while she is sleeping.   At baseline did treadmill 3 x 30 min weekly s stopping / 2.37mph and slt tilted but does not know specifics and has not been cleared to ex yet but feels back to baseline and denies am ha or knowledge of noct symptoms which are reported by husband who has osa rec Please see patient coordinator before you leave today  to schedule overnight pulse ox and I will call you with the results Stop lisinopril and start valsartan 80 mg daily     01/30/2016  f/u ov/Rachel [name] re: noct hypoxemia / excess throat clearing on ACEi  Not ex yet/no more cough or noct gasping for air on 02 2lpm  Not limited by breathing from desired activities  But just doing slow adl's so fare  rec Change diovan to 80-12.5 and stop the regular diovan 80 and hydrochlorothaizide 12.5  Weight control is simply a matter of calorie balance  Your goal wt is   Around 150  Stay on 02 2lpm at bedtime only      08/12/2016  f/u ov/Rachel [name] re: cough resolved/ noct desat p aspiration/ still 2lpm hs none with ambulation  Chief Complaint  Patient presents with  . Follow-up    Breathing is doing well.  No new co's  today.         Treadmill x 5 x weekly x 30 min x 12mph s stopping/ tilted up a little but mostly flat   No obvious day to day or daytime variability or assoc excess/ purulent sputum or mucus plugs or hemoptysis or cp or chest tightness, subjective wheeze or overt sinus or hb symptoms. No unusual exp hx or h/o childhood pna/ asthma or knowledge of premature birth.  Sleeping ok without nocturnal  or early am exacerbation  of respiratory  c/o's or need for noct saba. Also denies any obvious fluctuation of symptoms with weather or environmental changes or other aggravating or alleviating factors except as outlined above   Current Medications, Allergies, Complete Past Medical History, Past Surgical History, Family History, and Social History were reviewed in [employer] record.  ROS  The following are not active complaints unless bolded sore throat, dysphagia, dental problems, itching, sneezing,  nasal congestion or excess/ purulent secretions, ear ache,   fever, chills, sweats, unintended wt loss, classically pleuritic or exertional cp,  orthopnea pnd or leg swelling, presyncope, palpitations, abdominal pain, anorexia, nausea, vomiting, diarrhea  or change in bowel or bladder habits, change in stools or urine, dysuria,hematuria,  rash, arthralgias, visual complaints, headache, numbness, weakness or  ataxia or problems with walking or coordination,  change in mood/affect or memory.                  Objective:   Physical Exam   amb obese bf nad     08/12/2016        202  01/30/2016       196   12/16/15 196 lb (88.9 kg)  11/21/15 201 lb (91.2 kg)  11/01/15 201 lb (91.2 kg)    Vital signs reviewed  - - Note on arrival 02 sats  92% on RA        HEENT: nl dentition, turbinates, and oropharynx. Nl external ear canals without cough reflex - modified malipati =2/3  NECK :  without JVD/Nodes/TM/ nl carotid upstrokes bilaterally   LUNGS: no acc muscle use,  Nl contour  chest with a few crackles on insp L >R and decreased bs in bases    CV:  RRR  no s3 or murmur or increase in P2, no edema   ABD:  soft and nontender with nl inspiratory excursion in the supine position. No bruits or organomegaly, bowel sounds nl  MS:  Nl gait/ ext warm without deformities, calf tenderness, cyanosis or clubbing No obvious joint restrictions   SKIN: warm and dry without lesions    NEURO:  alert, approp, nl sensorium with  no motor deficits     CXR PA and Lateral:   08/12/2016 :    I personally reviewed images and agree with radiology impression as follows:   slt elevation of L HD/ mild ILD     Assessment:

## 2016-08-13 NOTE — Progress Notes (Signed)
Spoke with pt and notified of results per Dr. Wert. Pt verbalized understanding and denied any questions. 

## 2016-08-13 NOTE — Assessment & Plan Note (Signed)
Try off acei 12/16/2015 due to throat clearing > resolved 01/30/2016   Adequate control on present rx, reviewed in detail with pt > no change in rx needed  / no recurrent cough off ACEi so would leave off indefinitely

## 2016-08-13 NOTE — Assessment & Plan Note (Signed)
p 2nd ET  / lap October 19 2015  - 12/16/2015  Walked RA x 3 laps @ 185 ft each stopped due to very min sob/ sats 90% at end Spirometry 12/16/2015  FEV1 1.41 (71%)  Ratio 81  - ONO RA 12/19/2015 >>>  sats < 89% x 4h 10 min  > rec repeat on 2lpm  01/21/16 > no desat  - 08/12/2016  Walked RA x 3 laps @ 185 ft each stopped due to  End of study, very fast pace, with sats 82% at end   She clearly has not recovered from her ALI related to aspiration though note at her pace she's able to ex x 30 min and only desats when pushing it above that pace for [place] so she feels she's recovered and not interested in amb 02 at this point  rec PFTs/ hrct and f/u serially/ continue paced ex  I had an extended discussion with the patient reviewing all relevant studies completed to date and  lasting 15 to 20 minutes of a 25 minute visit    Each maintenance medication was reviewed in detail including most importantly the difference between maintenance and prns and under what circumstances the prns are to be triggered using an action plan format that is not reflected in the computer generated alphabetically organized AVS.    Please see AVS for specific instructions unique to this visit that I personally wrote and verbalized to the the pt in detail and then reviewed with pt  by my nurse highlighting any  changes in therapy recommended at today's visit to their plan of care.

## 2016-08-13 NOTE — Assessment & Plan Note (Signed)
Body mass index is 34.6 kg/m.  -  trending up again despite ex No results found for: TSH   Contributing to gerd risk/ doe/reviewed the need and the process to achieve and maintain neg calorie balance > defer f/u primary care including intermittently monitoring thyroid status

## 2016-08-21 ENCOUNTER — Encounter: Payer: Self-pay | Admitting: Internal Medicine

## 2016-08-27 ENCOUNTER — Telehealth: Payer: Self-pay | Admitting: Internal Medicine

## 2016-08-27 DIAGNOSIS — J9601 Acute respiratory failure with hypoxia: Secondary | ICD-10-CM

## 2016-08-27 NOTE — Telephone Encounter (Signed)
Per MW ONO on RA 08/21/16 by [facility] is normal and she can d/c o2  Spoke with pt and notified of results per Dr. Melvyn Novas. Pt verbalized understanding and denied any questions. Order sent to [facility] to d/c o2

## 2016-09-08 ENCOUNTER — Telehealth: Payer: Self-pay | Admitting: Internal Medicine

## 2016-09-08 NOTE — Telephone Encounter (Signed)
Spoke with pt, states that [facility] still has not picked up 02 tanks- order was placed to [facility] on 08/28/2016 and confirmation documented. Called [facility], office closes at 5:00.  Wcb.

## 2016-09-09 NOTE — Telephone Encounter (Signed)
Spoke with Jeani Hawking at [facility]-states they do have the order to pick up O2 and they are processing the order. They will contact patient soon.   Pt's spouse is aware that [facility] will be arranging for pick up of O2 soon. Nothing more needed at this time.

## 2016-09-25 ENCOUNTER — Ambulatory Visit (INDEPENDENT_AMBULATORY_CARE_PROVIDER_SITE_OTHER): Payer: 59 | Admitting: Internal Medicine

## 2016-09-25 ENCOUNTER — Encounter: Payer: Self-pay | Admitting: Internal Medicine

## 2016-09-25 VITALS — BP 108/74 | HR 102 | Ht 64.0 in | Wt 203.0 lb

## 2016-09-25 DIAGNOSIS — J9601 Acute respiratory failure with hypoxia: Secondary | ICD-10-CM

## 2016-09-25 NOTE — Patient Instructions (Addendum)
Pantoprazole (protonix) 40 mg   Take  30-60 min before first meal of the day and Pepcid (famotidine)  20 mg one @  bedtime until return to office - this is the best way to tell whether stomach acid is contributing to your problem.     GERD (REFLUX)  is an extremely common cause of respiratory symptoms just like yours , many times with no obvious heartburn at all.    It can be treated with medication, but also with lifestyle changes including elevation of the head of your bed (ideally with 6 inch  bed blocks),  Smoking cessation, avoidance of late meals, excessive alcohol, and avoid fatty foods, chocolate, peppermint, colas, red wine, and acidic juices such as orange juice.  NO MINT OR MENTHOL PRODUCTS SO NO COUGH DROPS   USE SUGARLESS CANDY INSTEAD (Jolley ranchers or Stover's or Life Savers) or even ice chips will also do - the key is to swallow to prevent all throat clearing. NO OIL BASED VITAMINS - use powdered substitutes.    Please schedule a follow up office visit in 6 weeks, call sooner if needed with pfts (or first available)

## 2016-09-25 NOTE — Progress Notes (Signed)
Subjective:     Patient ID: Rachel [name], female   DOB: 28-Sep-1950,    MRN: [id number]    Brief patient profile:  71 yobf quit smoking around 2005 wt 160 with mild doe since then but able to do treadmill/gym before ovarian  Surgery 11/12/15 benign cyst > N and V post op > dx serosal tear/ trocar herniation requiring repeat lap repair 11/18/15  and after this surgery noted desats so went home and referred to pulmonary clinic 12/16/2015 by Dr Everett Graff    History of Present Illness  12/16/2015 1st [facility] office visit/ Rachel [name]   Chief Complaint  Patient presents with  . Pulmonary Consult    Referred by Dr. Gavin Pound. Pt states she was advised her o2 sats were too low while she was in the hospital for surgery in July 2017. She states her spouse has noticed that she snores and occ gasps for air while she is sleeping.   At baseline did treadmill 3 x 30 min weekly s stopping / 2.17mph and slt tilted but does not know specifics and has not been cleared to ex yet but feels back to baseline and denies am ha or knowledge of noct symptoms which are reported by husband who has osa rec Please see patient coordinator before you leave today  to schedule overnight pulse ox and I will call you with the results Stop lisinopril and start valsartan 80 mg daily     01/30/2016  f/u ov/Onita Pfluger re: noct hypoxemia / excess throat clearing on ACEi  Not ex yet/no more cough or noct gasping for air on 02 2lpm  Not limited by breathing from desired activities  But just doing slow adl's so fare  rec Change diovan to 80-12.5 and stop the regular diovan 80 and hydrochlorothaizide 12.5  Weight control is simply a matter of calorie balance  Your goal wt is   Around 150  Stay on 02 2lpm at bedtime only      08/12/2016  f/u ov/Makyra Corprew re: cough resolved/ noct desat p aspiration/ still 2lpm hs none with ambulation  Chief Complaint  Patient presents with  . Follow-up    Breathing is doing well.  No new co's  today.    Treadmill x 5 x weekly x 30 min x 69mph s stopping at 4.5 % grade  rec Patient coordinator will call to  schedule overnight oximetry on Room air We will schedule you for full pfts next available and I will call you with results>  Did not happen   ONO RA 07/2716  desat > 89% x 25 min so ok to d/c 02 at pt's request    09/25/2016  f/u ov/Samier Jaco re: s/p ali from  aspiration injury / no longer on 02  Chief Complaint  Patient presents with  . Follow-up    Breathing is overall doing well.  She occ gets SOB when she gets up to use the bathroom at night.      no sob daytime, even with exertion / only issue is noct after being awoken to walk 15 ft to bathroom - new complaint since off 02 - No am HA or ams since 02 stopped at her request   No obvious day to day or daytime variability or assoc excess/ purulent sputum or mucus plugs or hemoptysis or cp or chest tightness, subjective wheeze or overt sinus or hb symptoms. No unusual exp hx or h/o childhood pna/ asthma or knowledge of premature birth.  Sleeping ok  without nocturnal  or early am exacerbation  of respiratory  c/o's or need for noct saba. Also denies any obvious fluctuation of symptoms with weather or environmental changes or other aggravating or alleviating factors except as outlined above   Current Medications, Allergies, Complete Past Medical History, Past Surgical History, Family History, and Social History were reviewed in [employer] record.  ROS  The following are not active complaints unless bolded sore throat, dysphagia, dental problems, itching, sneezing,  nasal congestion or excess/ purulent secretions, ear ache,   fever, chills, sweats, unintended wt loss, classically pleuritic or exertional cp,  orthopnea pnd or leg swelling, presyncope, palpitations, abdominal pain, anorexia, nausea, vomiting, diarrhea  or change in bowel or bladder habits, change in stools or urine, dysuria,hematuria,  rash,  arthralgias, visual complaints, headache, numbness, weakness or ataxia or problems with walking or coordination,  change in mood/affect or memory.                    Objective:   Physical Exam   amb obese bf nad    09/25/2016         203  08/12/2016        202  01/30/2016       196   12/16/15 196 lb (88.9 kg)  11/21/15 201 lb (91.2 kg)  11/01/15 201 lb (91.2 kg)    Vital signs reviewed  -   - Note on arrival 02 sats  99% on RA         HEENT: nl dentition, turbinates, and oropharynx. Nl external ear canals without cough reflex - modified malipati =2/3  NECK :  without JVD/Nodes/TM/ nl carotid upstrokes bilaterally   LUNGS: no acc muscle use,  Nl contour chest with min crackles on insp L >R and decreased bs in bases    CV:  RRR  no s3 or murmur or increase in P2, no edema   ABD:  soft and nontender with nl inspiratory excursion in the supine position. No bruits or organomegaly, bowel sounds nl  MS:  Nl gait/ ext warm without deformities, calf tenderness, cyanosis or clubbing No obvious joint restrictions   SKIN: warm and dry without lesions    NEURO:  alert, approp, nl sensorium with  no motor deficits     CXR PA and Lateral:   08/12/2016 :    I personally reviewed images and agree with radiology impression as follows:   slt elevation of L HD/ mild ILD     Assessment:

## 2016-09-27 NOTE — Assessment & Plan Note (Addendum)
p 2nd ET  / lap October 19 2015  - 12/16/2015  Walked RA x 3 laps @ 185 ft each stopped due to very min sob/ sats 90% at end Spirometry 12/16/2015  FEV1 1.41 (71%)  Ratio 81  - ONO RA 12/19/2015 >>>  sats < 89% x 4h 10 min  > rec repeat on 2lpm  01/21/16 > no desat  - 08/12/2016  Walked RA x 3 laps @ 185 ft each stopped due to  End of study, very fast pace, with sats 82% at end   - ONO RA 07/2716  desat > 89% x 25 min so ok to d/c 02 at pt's request   Doubt the complaint of sob to bathroom noct has anything to do with the desats sleeping > no change rx  Will add gerd rx 24/7 and have her return for pfts as prev planned  Each maintenance medication was reviewed in detail including most importantly the difference between maintenance and as needed and under what circumstances the prns are to be used.  Please see AVS for specific  Instructions which are unique to this visit and I personally typed out  which were reviewed in detail in writing with the patient and a copy provided.

## 2016-09-27 NOTE — Assessment & Plan Note (Signed)
Body mass index is 34.84 kg/m.  -  trending up slightly No results found for: TSH   Contributing to gerd risk/ doe/reviewed the need and the process to achieve and maintain neg calorie balance > defer f/u primary care including intermittently monitoring thyroid status

## 2016-10-14 DIAGNOSIS — Z1231 Encounter for screening mammogram for malignant neoplasm of breast: Secondary | ICD-10-CM | POA: Diagnosis not present

## 2016-10-14 DIAGNOSIS — Z803 Family history of malignant neoplasm of breast: Secondary | ICD-10-CM | POA: Diagnosis not present

## 2016-10-20 DIAGNOSIS — E039 Hypothyroidism, unspecified: Secondary | ICD-10-CM | POA: Diagnosis not present

## 2016-11-06 ENCOUNTER — Other Ambulatory Visit: Payer: Self-pay | Admitting: Internal Medicine

## 2016-11-06 DIAGNOSIS — R0600 Dyspnea, unspecified: Secondary | ICD-10-CM

## 2016-11-06 NOTE — Progress Notes (Unsigned)
PFT done today. 

## 2016-11-09 ENCOUNTER — Ambulatory Visit (INDEPENDENT_AMBULATORY_CARE_PROVIDER_SITE_OTHER): Payer: Medicare Other | Admitting: Internal Medicine

## 2016-11-09 ENCOUNTER — Encounter: Payer: Self-pay | Admitting: Internal Medicine

## 2016-11-09 VITALS — BP 112/74 | HR 60 | Ht 64.0 in | Wt 208.0 lb

## 2016-11-09 DIAGNOSIS — R0600 Dyspnea, unspecified: Secondary | ICD-10-CM

## 2016-11-09 DIAGNOSIS — J9601 Acute respiratory failure with hypoxia: Secondary | ICD-10-CM | POA: Diagnosis not present

## 2016-11-09 DIAGNOSIS — J8410 Pulmonary fibrosis, unspecified: Secondary | ICD-10-CM

## 2016-11-09 LAB — PULMONARY FUNCTION TEST
DL/VA % pred: 54 %
DL/VA: 2.63 ml/min/mmHg/L
DLCO cor % pred: 32 %
DLCO cor: 7.81 ml/min/mmHg
DLCO unc % pred: 29 %
DLCO unc: 7.26 ml/min/mmHg
FEF 25-75 Post: 0.76 L/sec
FEF 25-75 Pre: 0.61 L/sec
FEF2575-%Change-Post: 24 %
FEF2575-%Pred-Post: 40 %
FEF2575-%Pred-Pre: 32 %
FEV1-%Change-Post: 6 %
FEV1-%Pred-Post: 62 %
FEV1-%Pred-Pre: 57 %
FEV1-Post: 1.22 L
FEV1-Pre: 1.14 L
FEV1FVC-%Change-Post: 6 %
FEV1FVC-%Pred-Pre: 86 %
FEV6-%Change-Post: 0 %
FEV6-%Pred-Post: 69 %
FEV6-%Pred-Pre: 69 %
FEV6-Post: 1.69 L
FEV6-Pre: 1.68 L
FEV6FVC-%Pred-Post: 104 %
FEV6FVC-%Pred-Pre: 104 %
FVC-%Change-Post: 0 %
FVC-%Pred-Post: 67 %
FVC-%Pred-Pre: 66 %
FVC-Post: 1.69 L
FVC-Pre: 1.68 L
Post FEV1/FVC ratio: 72 %
Post FEV6/FVC ratio: 100 %
Pre FEV1/FVC ratio: 67 %
Pre FEV6/FVC Ratio: 100 %
RV % pred: 95 %
RV: 2 L
TLC % pred: 75 %
TLC: 3.79 L

## 2016-11-09 NOTE — Progress Notes (Signed)
PFT done today. 

## 2016-11-09 NOTE — Progress Notes (Signed)
Subjective:     Patient ID: Rachel [name], female   DOB: Jun 02, 1950,    MRN: [id number]    Brief patient profile:  72 yobf quit smoking around 2005 wt 160 with mild doe since then but able to do treadmill/gym before ovarian  Surgery 11/12/15 benign cyst > N and V post op > dx serosal tear/ trocar herniation requiring repeat lap repair 11/18/15  and after this surgery noted desats so went home and referred to pulmonary clinic 12/16/2015 by Dr Everett Graff    History of Present Illness  12/16/2015 1st [facility] office visit/ Rachel [name]   Chief Complaint  Patient presents with  . Pulmonary Consult    Referred by Dr. Gavin Pound. Pt states she was advised her o2 sats were too low while she was in the hospital for surgery in July 2017. She states her spouse has noticed that she snores and occ gasps for air while she is sleeping.   At baseline did treadmill 3 x 30 min weekly s stopping / 2.31mph and slt tilted but does not know specifics and has not been cleared to ex yet but feels back to baseline and denies am ha or knowledge of noct symptoms which are reported by husband who has osa rec Please see patient coordinator before you leave today  to schedule overnight pulse ox and I will call you with the results Stop lisinopril and start valsartan 80 mg daily     01/30/2016  f/u ov/Tayna Smethurst re: noct hypoxemia / excess throat clearing on ACEi  Not ex yet/no more cough or noct gasping for air on 02 2lpm  Not limited by breathing from desired activities  But just doing slow adl's so fare  rec Change diovan to 80-12.5 and stop the regular diovan 80 and hydrochlorothaizide 12.5  Weight control is simply a matter of calorie balance  Your goal wt is   Around 150  Stay on 02 2lpm at bedtime only     08/12/2016  f/u ov/Rachel [name] re: cough resolved/ noct desat p aspiration/ still 2lpm hs none with ambulation  Chief Complaint  Patient presents with  . Follow-up    Breathing is doing well.  No new co's  today.    Treadmill x 5 x weekly x 30 min x 34mph s stopping at 4.5 % grade  rec Patient coordinator will call to  schedule overnight oximetry on Room air We will schedule you for full pfts next available and I will call you with results>  Did not happen   ONO RA 07/2716  desat > 89% x 25 min so ok to d/c 02 at pt's request    09/25/2016  f/u ov/Rachel [name] re: s/p ali from  aspiration injury / no longer on 02  Chief Complaint  Patient presents with  . Follow-up    Breathing is overall doing well.  She occ gets SOB when she gets up to use the bathroom at night.    no sob daytime, even with exertion / only issue is noct after being awoken to walk 15 ft to bathroom - new complaint since off 02 - No am HA or ams since 02 stopped at her request  rec Pantoprazole (protonix) 40 mg   Take  30-60 min before first meal of the day and Pepcid (famotidine)  20 mg one @  bedtime until return to office - this is the best way to tell whether stomach acid is contributing to your problem.   GERD rx  11/09/2016  f/u ov/Rachel [name] re:  PF s/p ALI ? Asp summer 2017 maint rx gerd ppi/h2 hs Chief Complaint  Patient presents with  . Follow-up    PFT's done today. Breathing is unchanged. No new co's today.   no change in noct nightly sob only after go to [place] x lasts only  20 sec/ onset  when lies down after walking to [place] Uses [facility] parking to hips limiting  HT slowly does ok / worse walking in heat to car    No obvious day to day or daytime variability or assoc excess/ purulent sputum or mucus plugs or hemoptysis or cp or chest tightness, subjective wheeze or overt sinus or hb symptoms. No unusual exp hx or h/o childhood pna/ asthma or knowledge of premature birth.  Sleeping ok without nocturnal  or early am exacerbation  of respiratory  c/o's or need for noct saba. Also denies any obvious fluctuation of symptoms with weather or environmental changes or other aggravating or alleviating factors except as outlined above    Current Medications, Allergies, Complete Past Medical History, Past Surgical History, Family History, and Social History were reviewed in [employer] record.  ROS  The following are not active complaints unless bolded sore throat, dysphagia, dental problems, itching, sneezing,  nasal congestion or excess/ purulent secretions, ear ache,   fever, chills, sweats, unintended wt loss, classically pleuritic or exertional cp,  orthopnea pnd or leg swelling, presyncope, palpitations, abdominal pain, anorexia, nausea, vomiting, diarrhea  or change in bowel or bladder habits, change in stools or urine, dysuria,hematuria,  rash, arthralgias, visual complaints, headache, numbness, weakness or ataxia or problems with walking or coordination,  change in mood/affect or memory.                 Objective:   Physical Exam   amb obese bf nad    11/09/2016        208   09/25/2016         203  08/12/2016        202  01/30/2016       196   12/16/15 196 lb (88.9 kg)  11/21/15 201 lb (91.2 kg)  11/01/15 201 lb (91.2 kg)    Vital signs reviewed  -   - Note on arrival 02 sats  94% on RA      HEENT: nl dentition, turbinates, and oropharynx. Nl external ear canals without cough reflex - modified malipati =2/3  NECK :  without JVD/Nodes/TM/ nl carotid upstrokes bilaterally   LUNGS: no acc muscle use,  Nl contour chest , clear to A and P s crackles on insp or cough    CV:  RRR  no s3 or murmur or increase in P2, no edema   ABD:  soft and nontender with nl inspiratory excursion in the supine position. No bruits or organomegaly, bowel sounds nl  MS:  Nl gait/ ext warm without deformities, calf tenderness, cyanosis or clubbing No obvious joint restrictions   SKIN: warm and dry without lesions    NEURO:  alert, approp, nl sensorium with  no motor deficits         Assessment:

## 2016-11-09 NOTE — Patient Instructions (Signed)
No change in medications - stay as active as you can but please pace yourself like you did today  Please schedule a follow up visit in 6 months but call sooner if needed

## 2016-11-09 NOTE — Assessment & Plan Note (Signed)
Body mass index is 35.7 kg/m.  -  trending up / reviewed with pt No results found for: TSH   Contributing to gerd risk/ doe/reviewed the need and the process to achieve and maintain neg calorie balance > defer f/u primary care including intermittently monitoring thyroid status

## 2016-11-09 NOTE — Assessment & Plan Note (Addendum)
p 2nd ET  / lap October 19 2015  - 12/16/2015  Walked RA x 3 laps @ 185 ft each stopped due to very min sob/ sats 90% at end Spirometry 12/16/2015  FEV1 1.41 (71%)  Ratio 81  - ONO RA 12/19/2015 >>>  sats < 89% x 4h 10 min  > rec repeat on 2lpm  01/21/16 > no desat  - 08/12/2016  Walked RA x 3 laps @ 185 ft each stopped due to  End of study, very fast pace, with sats 82% at end  - ONO RA 07/2716  desat > 89% x 25 min so ok to d/c'd  02 at pt's request  -  No desats walking at a nl pace 11/09/2016 x 3 laps = 450 ft   Despite low dlco she has gradually recovered and due to hip limitation would not likely desat but at a pace she doesn't usually achieve so this problem has for all intents and purposes resolved   I had an extended discussion with the patient reviewing all relevant studies completed to date and  lasting 15 to 20 minutes of a 25 minute visit    Each maintenance medication was reviewed in detail including most importantly the difference between maintenance and prns and under what circumstances the prns are to be triggered using an action plan format that is not reflected in the computer generated alphabetically organized AVS.    Please see AVS for specific instructions unique to this visit that I personally wrote and verbalized to the the pt in detail and then reviewed with pt  by my nurse highlighting any  changes in therapy recommended at today's visit to their plan of care.

## 2016-11-11 DIAGNOSIS — E2839 Other primary ovarian failure: Secondary | ICD-10-CM | POA: Diagnosis not present

## 2016-11-11 DIAGNOSIS — N182 Chronic kidney disease, stage 2 (mild): Secondary | ICD-10-CM | POA: Diagnosis not present

## 2016-11-11 DIAGNOSIS — Z0000 Encounter for general adult medical examination without abnormal findings: Secondary | ICD-10-CM | POA: Diagnosis not present

## 2016-11-11 DIAGNOSIS — Z23 Encounter for immunization: Secondary | ICD-10-CM | POA: Diagnosis not present

## 2016-11-11 DIAGNOSIS — I129 Hypertensive chronic kidney disease with stage 1 through stage 4 chronic kidney disease, or unspecified chronic kidney disease: Secondary | ICD-10-CM | POA: Diagnosis not present

## 2016-11-14 NOTE — Assessment & Plan Note (Signed)
-   s/p  Probable  ALI summer 9983 complications from ovarian surgery ? asp PFT's  11/09/2016  FVC  1.69 (67%) no obst  p nothing  prior to study with DLCO  29/32 % corrects to 54  % for alv volume   FVC  - 11/09/2016  Walked RA x 3 laps @ 185 ft each stopped due to  End of study, nl pace, no sob or desat     Overall improving c/w theory supporting ALI, not progressive PF > no change rx needed

## 2016-11-19 DIAGNOSIS — D649 Anemia, unspecified: Secondary | ICD-10-CM | POA: Diagnosis not present

## 2016-12-02 DIAGNOSIS — M8589 Other specified disorders of bone density and structure, multiple sites: Secondary | ICD-10-CM | POA: Diagnosis not present

## 2016-12-02 DIAGNOSIS — Z780 Asymptomatic menopausal state: Secondary | ICD-10-CM | POA: Diagnosis not present

## 2017-01-23 ENCOUNTER — Other Ambulatory Visit: Payer: Self-pay | Admitting: Internal Medicine

## 2017-02-08 DIAGNOSIS — H401121 Primary open-angle glaucoma, left eye, mild stage: Secondary | ICD-10-CM | POA: Diagnosis not present

## 2017-02-08 DIAGNOSIS — H2513 Age-related nuclear cataract, bilateral: Secondary | ICD-10-CM | POA: Diagnosis not present

## 2017-02-08 DIAGNOSIS — H401112 Primary open-angle glaucoma, right eye, moderate stage: Secondary | ICD-10-CM | POA: Diagnosis not present

## 2017-02-24 DIAGNOSIS — M810 Age-related osteoporosis without current pathological fracture: Secondary | ICD-10-CM | POA: Diagnosis not present

## 2017-02-24 DIAGNOSIS — I129 Hypertensive chronic kidney disease with stage 1 through stage 4 chronic kidney disease, or unspecified chronic kidney disease: Secondary | ICD-10-CM | POA: Diagnosis not present

## 2017-02-24 DIAGNOSIS — R7309 Other abnormal glucose: Secondary | ICD-10-CM | POA: Diagnosis not present

## 2017-02-24 DIAGNOSIS — N182 Chronic kidney disease, stage 2 (mild): Secondary | ICD-10-CM | POA: Diagnosis not present

## 2017-02-24 DIAGNOSIS — E039 Hypothyroidism, unspecified: Secondary | ICD-10-CM | POA: Diagnosis not present

## 2017-03-12 DIAGNOSIS — Z23 Encounter for immunization: Secondary | ICD-10-CM | POA: Diagnosis not present

## 2017-04-26 ENCOUNTER — Other Ambulatory Visit: Payer: Self-pay | Admitting: Internal Medicine

## 2017-05-12 ENCOUNTER — Encounter: Payer: Self-pay | Admitting: Internal Medicine

## 2017-05-12 ENCOUNTER — Ambulatory Visit (INDEPENDENT_AMBULATORY_CARE_PROVIDER_SITE_OTHER): Payer: Medicare HMO | Admitting: Internal Medicine

## 2017-05-12 VITALS — BP 112/78 | HR 90 | Ht 64.0 in | Wt 207.8 lb

## 2017-05-12 DIAGNOSIS — J8410 Pulmonary fibrosis, unspecified: Secondary | ICD-10-CM | POA: Diagnosis not present

## 2017-05-12 NOTE — Progress Notes (Signed)
Subjective:     Patient ID: Rachel [name], female   DOB: Jun 02, 1950,    MRN: [id number]    Brief patient profile:  72 yobf quit smoking around 2005 wt 160 with mild doe since then but able to do treadmill/gym before ovarian  Surgery 11/12/15 benign cyst > N and V post op > dx serosal tear/ trocar herniation requiring repeat lap repair 11/18/15  and after this surgery noted desats so went home and referred to pulmonary clinic 12/16/2015 by Dr Everett Graff    History of Present Illness  12/16/2015 [facility]st [facility] office visit/ W[facility]   Chief Complaint  Patient presents with  . Pulmonary Consult    Referred by Dr. Gavin Pound. Pt states she was advised her o2 sats were too low while she was in the hospital for surgery in July 2017. She states her spouse has noticed that she snores and occ gasps for air while she is sleeping.   At baseline did treadmill 3 x 30 min weekly s stopping / 2.31mph and slt tilted but does not know specifics and has not been cleared to ex yet but feels back to baseline and denies am ha or knowledge of noct symptoms which are reported by husband who has osa rec Please see patient coordinator before you leave today  to schedule overnight pulse ox and I will call you with the results Stop lisinopril and start valsartan 80 mg daily     01/30/2016  f/u ov/Wert re: noct hypoxemia / excess throat clearing on ACEi  Not ex yet/no more cough or noct gasping for air on 02 2lpm  Not limited by breathing from desired activities  But just doing slow adl's so fare  rec Change diovan to 80-12.5 and stop the regular diovan 80 and hydrochlorothaizide 12.5  Weight control is simply a matter of calorie balance  Your goal wt is   Around 150  Stay on 02 2lpm at bedtime only     08/12/2016  f/u ov/Wert re: cough resolved/ noct desat p aspiration/ still 2lpm hs none with ambulation  Chief Complaint  Patient presents with  . Follow-up    Breathing is doing well.  No new co's  today.    Treadmill x 5 x weekly x 30 min x 34mph s stopping at 4.5 % grade  rec Patient coordinator will call to  schedule overnight oximetry on Room air We will schedule you for full pfts next available and I will call you with results>  Did not happen   ONO RA 07/2716  desat > 89% x 25 min so ok to d/c 02 at pt's request    09/25/2016  f/u ov/Wert re: s/p ali from  aspiration injury / no longer on 02  Chief Complaint  Patient presents with  . Follow-up    Breathing is overall doing well.  She occ gets SOB when she gets up to use the bathroom at night.    no sob daytime, even with exertion / only issue is noct after being awoken to walk 15 ft to bathroom - new complaint since off 02 - No am HA or ams since 02 stopped at her request  rec Pantoprazole (protonix) 40 mg   Take  30-60 min before first meal of the day and Pepcid (famotidine)  20 mg one @  bedtime until return to office - this is the best way to tell whether stomach acid is contributing to your problem.   GERD rx  11/09/2016  f/u ov/Wert re:  PF s/p ALI ? Asp summer 2017 maint rx gerd ppi/h2 hs Chief Complaint  Patient presents with  . Follow-up    PFT's done today. Breathing is unchanged. No new co's today.   no change in noct nightly sob only after go to [place] x lasts only  20 sec/ onset  when lies down after walking to [place] Uses [facility] parking to hips limiting  HT slowly does ok / worse walking in heat to car  rec No change rx   05/12/2017  f/u ov/Wert re: f/u PF p ali  Chief Complaint  Patient presents with  . Follow-up    Breathing is doing well. She denies any new co's today.    started treadmill x 3 months x 5 days / week, 30 min s stopping 3.50mph raised a little bit but  doesn't know grade - not checking 02 sats / no cough/ sleeps fine  No obvious day to day or daytime variability or assoc excess/ purulent sputum or mucus plugs or hemoptysis or cp or chest tightness, subjective wheeze or overt sinus or hb symptoms. No  unusual exposure hx or h/o childhood pna/ asthma or knowledge of premature birth.  Sleeping ok flat without nocturnal  or early am exacerbation  of respiratory  c/o's or need for noct saba. Also denies any obvious fluctuation of symptoms with weather or environmental changes or other aggravating or alleviating factors except as outlined above   Current Allergies, Complete Past Medical History, Past Surgical History, Family History, and Social History were reviewed in [employer] record.  ROS  The following are not active complaints unless bolded Hoarseness, sore throat, dysphagia, dental problems, itching, sneezing,  nasal congestion or discharge of excess mucus or purulent secretions, ear ache,   fever, chills, sweats, unintended wt loss or wt gain, classically pleuritic or exertional cp,  orthopnea pnd or leg swelling, presyncope, palpitations, abdominal pain, anorexia, nausea, vomiting, diarrhea  or change in bowel habits or change in bladder habits, change in stools or change in urine, dysuria, hematuria,  rash, arthralgias, visual complaints, headache, numbness, weakness or ataxia or problems with walking or coordination,  change in mood/affect or memory.        Current Meds  Medication Sig  . atorvastatin (LIPITOR) 20 MG tablet Take 20 mg by mouth at bedtime.   . calcium carbonate (TUMS - DOSED IN MG ELEMENTAL CALCIUM) 500 MG chewable tablet Chew 4 tablets by mouth 2 (two) times daily as needed for indigestion or heartburn.  . calcium citrate (CALCITRATE - DOSED IN MG ELEMENTAL CALCIUM) 950 MG tablet Take 200 mg of elemental calcium by mouth daily.  . Cholecalciferol (VITAMIN D3) 5000 units CAPS Take 5,000 Units by mouth daily.   . COMBIGAN 0.2-0.5 % ophthalmic solution Place 1 drop into the right eye 2 (two) times daily.  . famotidine (PEPCID) 20 MG tablet Take 20 mg by mouth at bedtime.  Marland Kitchen levothyroxine (SYNTHROID, LEVOTHROID) 100 MCG tablet Take 100 mcg by mouth  daily before breakfast.  . pantoprazole (PROTONIX) 40 MG tablet Take 40 mg by mouth every other day.   Marland Kitchen PROLIA 60 MG/ML SOLN injection As directed  . valsartan-hydrochlorothiazide (DIOVAN-HCT) 80-12.5 MG tablet TAKE 1 TABLET BY MOUTH DAILY.                   Objective:   Physical Exam  amb obese bf nad/ all smiles  05/12/2017  208  11/09/2016        208   09/25/2016         203  08/12/2016        202  01/30/2016       196   12/16/15 196 lb (88.9 kg)  11/21/15 201 lb (91.2 kg)  11/01/15 201 lb (91.2 kg)    Vital signs reviewed - Note on arrival 02 sats  100% on RA       HEENT: nl dentition, turbinates bilaterally, and oropharynx. Nl external ear canals without cough reflex   NECK :  without JVD/Nodes/TM/ nl carotid upstrokes bilaterally   LUNGS: no acc muscle use,  Nl contour chest which is clear to A and P bilaterally without cough on insp or exp maneuvers   CV:  RRR  no s3 or murmur or increase in P2, and no edema   ABD:  soft and nontender with nl inspiratory excursion in the supine position. No bruits or organomegaly appreciated, bowel sounds nl  MS:  Nl gait/ ext warm without deformities, calf tenderness, cyanosis or clubbing No obvious joint restrictions   SKIN: warm and dry without lesions    NEURO:  alert, approp, nl sensorium with  no motor or cerebellar deficits apparent.           Assessment:

## 2017-05-12 NOTE — Patient Instructions (Addendum)
Measure 02 saturations toward the end of exertion and if they are dropping vs present levels will need to return asap  Otherwise pulmonary follow up is as needed

## 2017-05-13 ENCOUNTER — Encounter: Payer: Self-pay | Admitting: Internal Medicine

## 2017-05-13 NOTE — Assessment & Plan Note (Addendum)
-   s/p  Probable  ALI summer 3403 complications from ovarian surgery ? asp PFT's  11/09/2016  FVC  1.69 (67%) no obst  p nothing  prior to study with DLCO  29/32 % corrects to 54  % for alv volume   FVC  - 11/09/2016  Walked RA x 3 laps @ 185 ft each stopped due to  End of study, nl pace, no sob or desat     She is progressively improving in terms of ex tol c/w PF resulting from ALI,  Not IPF/UIP - should check sats toward the end of ex to monitor for any progression over time which seems very unlikely at this point.  Pulmonary f/u can be prn   Each maintenance medication was reviewed in detail including most importantly the difference between maintenance and as needed and under what circumstances the prns are to be used.  Please see AVS for specific  Instructions which are unique to this visit and I personally typed out  which were reviewed in detail in writing with the patient and a copy provided.

## 2017-07-26 ENCOUNTER — Other Ambulatory Visit: Payer: Self-pay | Admitting: Internal Medicine

## 2017-07-27 ENCOUNTER — Other Ambulatory Visit: Payer: Self-pay | Admitting: Internal Medicine

## 2017-08-12 DIAGNOSIS — Z01419 Encounter for gynecological examination (general) (routine) without abnormal findings: Secondary | ICD-10-CM | POA: Diagnosis not present

## 2017-08-12 DIAGNOSIS — Z6834 Body mass index (BMI) 34.0-34.9, adult: Secondary | ICD-10-CM | POA: Diagnosis not present

## 2017-08-12 DIAGNOSIS — Z862 Personal history of diseases of the blood and blood-forming organs and certain disorders involving the immune mechanism: Secondary | ICD-10-CM | POA: Diagnosis not present

## 2017-08-12 DIAGNOSIS — Z124 Encounter for screening for malignant neoplasm of cervix: Secondary | ICD-10-CM | POA: Diagnosis not present

## 2017-08-25 DIAGNOSIS — I129 Hypertensive chronic kidney disease with stage 1 through stage 4 chronic kidney disease, or unspecified chronic kidney disease: Secondary | ICD-10-CM | POA: Diagnosis not present

## 2017-08-25 DIAGNOSIS — R7309 Other abnormal glucose: Secondary | ICD-10-CM | POA: Diagnosis not present

## 2017-08-25 DIAGNOSIS — E039 Hypothyroidism, unspecified: Secondary | ICD-10-CM | POA: Diagnosis not present

## 2017-08-25 DIAGNOSIS — N182 Chronic kidney disease, stage 2 (mild): Secondary | ICD-10-CM | POA: Diagnosis not present

## 2017-08-25 DIAGNOSIS — M810 Age-related osteoporosis without current pathological fracture: Secondary | ICD-10-CM | POA: Diagnosis not present

## 2017-09-01 DIAGNOSIS — H43813 Vitreous degeneration, bilateral: Secondary | ICD-10-CM | POA: Diagnosis not present

## 2017-09-01 DIAGNOSIS — H401121 Primary open-angle glaucoma, left eye, mild stage: Secondary | ICD-10-CM | POA: Diagnosis not present

## 2017-09-01 DIAGNOSIS — H401112 Primary open-angle glaucoma, right eye, moderate stage: Secondary | ICD-10-CM | POA: Diagnosis not present

## 2017-10-14 DIAGNOSIS — N182 Chronic kidney disease, stage 2 (mild): Secondary | ICD-10-CM | POA: Diagnosis not present

## 2017-10-14 DIAGNOSIS — I129 Hypertensive chronic kidney disease with stage 1 through stage 4 chronic kidney disease, or unspecified chronic kidney disease: Secondary | ICD-10-CM | POA: Diagnosis not present

## 2017-10-14 DIAGNOSIS — E039 Hypothyroidism, unspecified: Secondary | ICD-10-CM | POA: Diagnosis not present

## 2017-10-15 DIAGNOSIS — Z1231 Encounter for screening mammogram for malignant neoplasm of breast: Secondary | ICD-10-CM | POA: Diagnosis not present

## 2017-10-15 DIAGNOSIS — Z803 Family history of malignant neoplasm of breast: Secondary | ICD-10-CM | POA: Diagnosis not present

## 2017-10-19 ENCOUNTER — Other Ambulatory Visit: Payer: Self-pay | Admitting: Internal Medicine

## 2017-10-25 ENCOUNTER — Other Ambulatory Visit: Payer: Self-pay | Admitting: Internal Medicine

## 2017-10-27 ENCOUNTER — Other Ambulatory Visit: Payer: Self-pay | Admitting: Internal Medicine

## 2017-10-29 ENCOUNTER — Other Ambulatory Visit: Payer: Self-pay | Admitting: Internal Medicine

## 2017-11-20 ENCOUNTER — Other Ambulatory Visit: Payer: Self-pay | Admitting: Internal Medicine

## 2017-12-16 DIAGNOSIS — Z862 Personal history of diseases of the blood and blood-forming organs and certain disorders involving the immune mechanism: Secondary | ICD-10-CM | POA: Diagnosis not present

## 2017-12-23 ENCOUNTER — Other Ambulatory Visit: Payer: Self-pay | Admitting: Internal Medicine

## 2018-01-14 ENCOUNTER — Other Ambulatory Visit: Payer: Self-pay | Admitting: Internal Medicine

## 2018-01-25 DIAGNOSIS — K5730 Diverticulosis of large intestine without perforation or abscess without bleeding: Secondary | ICD-10-CM | POA: Diagnosis not present

## 2018-01-25 DIAGNOSIS — D509 Iron deficiency anemia, unspecified: Secondary | ICD-10-CM | POA: Diagnosis not present

## 2018-01-25 DIAGNOSIS — K219 Gastro-esophageal reflux disease without esophagitis: Secondary | ICD-10-CM | POA: Diagnosis not present

## 2018-01-25 DIAGNOSIS — Z1211 Encounter for screening for malignant neoplasm of colon: Secondary | ICD-10-CM | POA: Diagnosis not present

## 2018-01-27 ENCOUNTER — Other Ambulatory Visit: Payer: Self-pay | Admitting: Internal Medicine

## 2018-02-01 NOTE — Telephone Encounter (Signed)
Refill request

## 2018-02-02 ENCOUNTER — Other Ambulatory Visit: Payer: Self-pay | Admitting: Internal Medicine

## 2018-02-03 ENCOUNTER — Telehealth: Payer: Self-pay

## 2018-02-03 NOTE — Telephone Encounter (Signed)
Patient's refill for temazepam sent to the pharmacy by Dr. Baird Cancer.

## 2018-02-25 ENCOUNTER — Other Ambulatory Visit: Payer: Self-pay

## 2018-02-25 ENCOUNTER — Encounter: Payer: Self-pay | Admitting: Internal Medicine

## 2018-02-25 ENCOUNTER — Ambulatory Visit (INDEPENDENT_AMBULATORY_CARE_PROVIDER_SITE_OTHER): Payer: Medicare HMO | Admitting: Internal Medicine

## 2018-02-25 VITALS — BP 112/64 | HR 62 | Temp 97.6°F | Ht 62.5 in | Wt 204.6 lb

## 2018-02-25 DIAGNOSIS — I129 Hypertensive chronic kidney disease with stage 1 through stage 4 chronic kidney disease, or unspecified chronic kidney disease: Secondary | ICD-10-CM | POA: Diagnosis not present

## 2018-02-25 DIAGNOSIS — N182 Chronic kidney disease, stage 2 (mild): Secondary | ICD-10-CM | POA: Diagnosis not present

## 2018-02-25 DIAGNOSIS — Z0000 Encounter for general adult medical examination without abnormal findings: Secondary | ICD-10-CM | POA: Diagnosis not present

## 2018-02-25 DIAGNOSIS — Z23 Encounter for immunization: Secondary | ICD-10-CM

## 2018-02-25 DIAGNOSIS — Z6836 Body mass index (BMI) 36.0-36.9, adult: Secondary | ICD-10-CM

## 2018-02-25 DIAGNOSIS — R7309 Other abnormal glucose: Secondary | ICD-10-CM | POA: Diagnosis not present

## 2018-02-25 DIAGNOSIS — E039 Hypothyroidism, unspecified: Secondary | ICD-10-CM | POA: Diagnosis not present

## 2018-02-25 DIAGNOSIS — M85852 Other specified disorders of bone density and structure, left thigh: Secondary | ICD-10-CM

## 2018-02-25 LAB — POCT URINALYSIS DIPSTICK
Bilirubin, UA: NEGATIVE
Glucose, UA: NEGATIVE
Ketones, UA: NEGATIVE
Nitrite, UA: NEGATIVE
Protein, UA: NEGATIVE
Spec Grav, UA: 1.025 (ref 1.010–1.025)
Urobilinogen, UA: 0.2 E.U./dL
pH, UA: 5 (ref 5.0–8.0)

## 2018-02-25 LAB — POCT UA - MICROALBUMIN
Creatinine, POC: 300 mg/dL
Microalbumin Ur, POC: 80 mg/L

## 2018-02-25 MED ORDER — DENOSUMAB 60 MG/ML ~~LOC~~ SOSY: PREFILLED_SYRINGE | SUBCUTANEOUS | 1 refills | Status: DC

## 2018-02-25 NOTE — Progress Notes (Signed)
Subjective:     Patient ID: Rachel [name] , female    DOB: Oct 01, 1950 , 67 y.o.   MRN: [id number]   Chief Complaint  Patient presents with  . Annual Exam  . Hypertension    HPI  She is here today for a full physical examination. She has no specific concerns or complaints at this time. She reports exercising five days per week- she likes to walk.   Hypertension  This is a chronic problem. The current episode started more than 1 year ago. The problem is controlled. Risk factors for coronary artery disease include dyslipidemia, obesity and post-menopausal state. The current treatment provides significant improvement. There are no compliance problems.  Hypertensive end-organ damage includes kidney disease.     Past Medical History:  Diagnosis Date  . Brain aneurysm 2005  . GERD (gastroesophageal reflux disease)   . Hypertension   . Hypothyroidism   . Osteoporosis   . Thyroid disease      Family History  Problem Relation Age of Onset  . Cancer Mother        Breast   . Hypertension Mother   . Cancer Father        Prostate  . Hypertension Father   . Cancer Sister        Breast     Current Outpatient Medications:  .  atorvastatin (LIPITOR) 20 MG tablet, Take 20 mg by mouth at bedtime. , Disp: , Rfl:  .  calcium carbonate (TUMS - DOSED IN MG ELEMENTAL CALCIUM) 500 MG chewable tablet, Chew 4 tablets by mouth 2 (two) times daily as needed for indigestion or heartburn., Disp: , Rfl:  .  Cholecalciferol (VITAMIN D3) 5000 units CAPS, Take 5,000 Units by mouth daily. , Disp: , Rfl:  .  COMBIGAN 0.2-0.5 % ophthalmic solution, Place 1 drop into the right eye 2 (two) times daily., Disp: , Rfl: 6 .  denosumab (PROLIA) 60 MG/ML SOSY injection, Inject 60 mg into the skin every 6 (six) months., Disp: , Rfl:  .  denosumab (PROLIA) 60 MG/ML SOSY injection, Prolia 60 mg/mL subcutaneous syringe, Disp: 1.8 mL, Rfl: 1 .  famotidine (PEPCID) 20 MG tablet, Take 20 mg by mouth at bedtime., Disp:  , Rfl:  .  levothyroxine (SYNTHROID, LEVOTHROID) 100 MCG tablet, Take 100 mcg by mouth daily before breakfast. 1 MON - FRI AND 1/2 TAB SAT-SUN, Disp: , Rfl:  .  pantoprazole (PROTONIX) 40 MG tablet, Take 40 mg by mouth every other day. , Disp: , Rfl:  .  PROLIA 60 MG/ML SOLN injection, As directed, Disp: , Rfl:  .  temazepam (RESTORIL) 15 MG capsule, TAKE 1 CAPSULE BY MOUTH EVERY DAY AT BEDTIME AS NEEDED, Disp: 30 capsule, Rfl: 2 .  valsartan-hydrochlorothiazide (DIOVAN-HCT) 80-12.5 MG tablet, TAKE 1 TABLET BY MOUTH EVERY DAY, Disp: 30 tablet, Rfl: 2   Allergies  Allergen Reactions  . Dilantin [Phenytoin Sodium Extended] Rash     Review of Systems  Constitutional: Negative.   HENT: Negative.   Eyes: Negative.   Respiratory: Negative.   Cardiovascular: Negative.   Gastrointestinal: Negative.   Endocrine: Negative.   Genitourinary: Negative.   Musculoskeletal: Negative.   Skin: Negative.   Allergic/Immunologic: Negative.   Neurological: Negative.   Hematological: Negative.   Psychiatric/Behavioral: Negative.      Today's Vitals   02/25/18 1111 02/25/18 1144  BP: 124/78 112/64  Pulse: 62   Temp: 97.6 F (36.4 C)   TempSrc: Oral   Weight: 204 lb 9.6  oz (92.8 kg)   Height: 5' 2.5" (1.588 m)   PainSc: 0-No pain    Body mass index is 36.83 kg/m.   Objective:  Physical Exam  Constitutional: She is oriented to person, place, and time. She appears well-developed and well-nourished.  HENT:  Head: Normocephalic and atraumatic.  Right Ear: External ear normal.  Left Ear: External ear normal.  Nose: Nose normal.  Mouth/Throat: Oropharynx is clear and moist.  Eyes: Pupils are equal, round, and reactive to light. Conjunctivae and EOM are normal.  Neck: Normal range of motion. Neck supple.  Cardiovascular: Normal rate, regular rhythm, normal heart sounds and intact distal pulses.  Pulmonary/Chest: Effort normal and breath sounds normal. Right breast exhibits no inverted nipple,  no mass, no nipple discharge, no skin change and no tenderness. Left breast exhibits no inverted nipple, no mass, no nipple discharge, no skin change and no tenderness.  Abdominal: Soft. Bowel sounds are normal.  Genitourinary:  Genitourinary Comments: deferred  Musculoskeletal: Normal range of motion.  Neurological: She is alert and oriented to person, place, and time.  Skin: Skin is warm and dry.  Psychiatric: She has a normal mood and affect.  Nursing note and vitals reviewed.       Assessment And Plan:     1. Routine general medical examination at health care facility  A full exam was performed.  Importance of monthly self breast exams was discussed with the patient.   PATIENT HAS BEEN ADVISED TO GET 30-45 MINUTES REGULAR EXERCISE NO LESS THAN FOUR TO FIVE DAYS PER WEEK - BOTH WEIGHTBEARING EXERCISES AND AEROBIC ARE RECOMMENDED.  SHE IS ADVISED TO FOLLOW A HEALTHY DIET WITH AT LEAST SIX FRUITS/VEGGIES PER DAY, DECREASE INTAKE OF RED MEAT, AND TO INCREASE FISH INTAKE TO TWO DAYS PER WEEK.  MEATS/FISH SHOULD NOT BE FRIED, BAKED OR BROILED IS PREFERABLE.  I SUGGEST WEARING SPF 50 SUNSCREEN ON EXPOSED PARTS AND ESPECIALLY WHEN IN THE DIRECT SUNLIGHT FOR AN EXTENDED PERIOD OF TIME.  PLEASE AVOID FAST FOOD RESTAURANTS AND INCREASE YOUR WATER INTAKE.   2. Hypertensive nephropathy  Well controlled. She will continue with current meds. She is encouraged to avoid adding salt to her foods. EKG performed, no acute changes noted. She will rto in six months for re-evaluation.   - POCT Urinalysis Dipstick (81002) - POCT UA - Microalbumin - EKG 12-Lead - CBC no Diff - CMP14+EGFR - Lipid Profile  3. Chronic renal disease, stage II  Chronic. She is encouraged to stay well hydrated. Importance of optimal BP control was also discussed with the patient.   4. Primary hypothyroidism  I will check a thyroid panel and adjust meds as needed.  - TSH - T4, Free  5. Other abnormal glucose  HER A1C  HAS BEEN ELEVATED IN THE PAST. I WILL CHECK AN A1C, BMET TODAY. SHE WAS ENCOURAGED TO AVOID SUGARY BEVERAGES AND PROCESSED FOODS INCLUDNG BREADS, RICE AND PASTA.  - Hemoglobin A1c  6. Class 2 severe obesity due to excess calories with serious comorbidity and body mass index (BMI) of 36.0 to 36.9 in adult [facility])  She was complimented on her weight loss thus far and encouraged to keep up the great work. She is encouraged to strive for BMI less than 30 to decrease cardiac risk.   7. Age-related osteoporosis without current pathological fracture  She previously had osteoporosis improved with use of Prolia, calcium and vit D supplementation and weight-bearing exercises. She was given Prolia injection. She will rto in six months  for her next injection. Importance of calcium supplementation while on Prolia was reviewed with the patient.   8. Need for vaccination  - Flu vaccine HIGH DOSE PF (Fluzone High dose)        Maximino Greenland, MD

## 2018-02-25 NOTE — Patient Instructions (Signed)
Prediabetes Eating Plan Prediabetes-also called impaired glucose tolerance or impaired fasting glucose-is a condition that causes blood sugar (blood glucose) levels to be higher than normal. Following a healthy diet can help to keep prediabetes under control. It can also help to lower the risk of type 2 diabetes and heart disease, which are increased in people who have prediabetes. Along with regular exercise, a healthy diet:  Promotes weight loss.  Helps to control blood sugar levels.  Helps to improve the way that the body uses insulin.  What do I need to know about this eating plan?  Use the glycemic index (GI) to plan your meals. The index tells you how quickly a food will raise your blood sugar. Choose low-GI foods. These foods take a longer time to raise blood sugar.  Pay close attention to the amount of carbohydrates in the food that you eat. Carbohydrates increase blood sugar levels.  Keep track of how many calories you take in. Eating the right amount of calories will help you to achieve a healthy weight. Losing about 7 percent of your starting weight can help to prevent type 2 diabetes.  You may want to follow a Mediterranean diet. This diet includes a lot of vegetables, lean meats or fish, whole grains, fruits, and healthy oils and fats. What foods can I eat? Grains Whole grains, such as whole-wheat or whole-grain breads, crackers, cereals, and pasta. Unsweetened oatmeal. Bulgur. Barley. Quinoa. Brown rice. Corn or whole-wheat flour tortillas or taco shells. Vegetables Lettuce. Spinach. Peas. Beets. Cauliflower. Cabbage. Broccoli. Carrots. Tomatoes. Squash. Eggplant. Herbs. Peppers. Onions. Cucumbers. Brussels sprouts. Fruits Berries. Bananas. Apples. Oranges. Grapes. Papaya. Mango. Pomegranate. Kiwi. Grapefruit. Cherries. Meats and Other Protein Sources Seafood. Lean meats, such as chicken and turkey or lean cuts of pork and beef. Tofu. Eggs. Nuts. Beans. Dairy Low-fat or  fat-free dairy products, such as yogurt, cottage cheese, and cheese. Beverages Water. Tea. Coffee. Sugar-free or diet soda. Seltzer water. Milk. Milk alternatives, such as soy or almond milk. Condiments Mustard. Relish. Low-fat, low-sugar ketchup. Low-fat, low-sugar barbecue sauce. Low-fat or fat-free mayonnaise. Sweets and Desserts Sugar-free or low-fat pudding. Sugar-free or low-fat ice cream and other frozen treats. Fats and Oils Avocado. Walnuts. Olive oil. The items listed above may not be a complete list of recommended foods or beverages. Contact your dietitian for more options. What foods are not recommended? Grains Refined white flour and flour products, such as bread, pasta, snack foods, and cereals. Beverages Sweetened drinks, such as sweet iced tea and soda. Sweets and Desserts Baked goods, such as cake, cupcakes, pastries, cookies, and cheesecake. The items listed above may not be a complete list of foods and beverages to avoid. Contact your dietitian for more information. This information is not intended to replace advice given to you by your health care provider. Make sure you discuss any questions you have with your health care provider. Document Released: 08/28/2014 Document Revised: 09/19/2015 Document Reviewed: 05/09/2014 Elsevier Interactive Patient Education  2017 [employer]  

## 2018-02-28 LAB — CMP14+EGFR
ALT: 12 IU/L (ref 0–32)
AST: 20 IU/L (ref 0–40)
Albumin/Globulin Ratio: 1.3 (ref 1.2–2.2)
Albumin: 4 g/dL (ref 3.6–4.8)
Alkaline Phosphatase: 56 IU/L (ref 39–117)
BUN/Creatinine Ratio: 14 (ref 12–28)
BUN: 16 mg/dL (ref 8–27)
Bilirubin Total: 0.4 mg/dL (ref 0.0–1.2)
CO2: 23 mmol/L (ref 20–29)
Calcium: 10.1 mg/dL (ref 8.7–10.3)
Chloride: 101 mmol/L (ref 96–106)
Creatinine, Ser: 1.16 mg/dL — ABNORMAL HIGH (ref 0.57–1.00)
GFR calc Af Amer: 57 mL/min/{1.73_m2} — ABNORMAL LOW (ref >59)
GFR calc non Af Amer: 49 mL/min/{1.73_m2} — ABNORMAL LOW (ref >59)
Globulin, Total: 3.1 g/dL (ref 1.5–4.5)
Glucose: 91 mg/dL (ref 65–99)
Potassium: 4.2 mmol/L (ref 3.5–5.2)
Sodium: 140 mmol/L (ref 134–144)
Total Protein: 7.1 g/dL (ref 6.0–8.5)

## 2018-02-28 LAB — LIPID PANEL
Chol/HDL Ratio: 3.2 ratio (ref 0.0–4.4)
Cholesterol, Total: 156 mg/dL (ref 100–199)
HDL: 49 mg/dL (ref >39)
LDL Calculated: 89 mg/dL (ref 0–99)
Triglycerides: 91 mg/dL (ref 0–149)
VLDL Cholesterol Cal: 18 mg/dL (ref 5–40)

## 2018-02-28 LAB — CBC
Hematocrit: 44.2 % (ref 34.0–46.6)
Hemoglobin: 13.7 g/dL (ref 11.1–15.9)
MCH: 27.5 pg (ref 26.6–33.0)
MCHC: 31 g/dL — ABNORMAL LOW (ref 31.5–35.7)
MCV: 89 fL (ref 79–97)
Platelets: 231 10*3/uL (ref 150–450)
RBC: 4.99 x10E6/uL (ref 3.77–5.28)
RDW: 24.7 % — ABNORMAL HIGH (ref 12.3–15.4)
WBC: 5.9 10*3/uL (ref 3.4–10.8)

## 2018-02-28 LAB — HEMOGLOBIN A1C
Est. average glucose Bld gHb Est-mCnc: 114 mg/dL
Hgb A1c MFr Bld: 5.6 % (ref 4.8–5.6)

## 2018-02-28 LAB — HEPATITIS C ANTIBODY: Hep C Virus Ab: <0.1 s/co ratio (ref 0.0–0.9)

## 2018-02-28 LAB — TSH: TSH: 3.65 u[IU]/mL (ref 0.450–4.500)

## 2018-02-28 LAB — T4, FREE: Free T4: 1.69 ng/dL (ref 0.82–1.77)

## 2018-02-28 NOTE — Progress Notes (Signed)
Here are your lab results:  Your blood count is normal. Your kidney function has decreased. Please increase your water intake. Your liver function is normal. Your hba1c is normal. You are not prediabetic.   Your cholesterol looks pretty good. Please avoid fried foods and continue to exercise regularly.  Your thyroid function is within normal limits. Please continue with current supplementation. You are negative for hepatitis C.   Take care,   RS

## 2018-03-02 DIAGNOSIS — H43813 Vitreous degeneration, bilateral: Secondary | ICD-10-CM | POA: Diagnosis not present

## 2018-03-02 DIAGNOSIS — H02401 Unspecified ptosis of right eyelid: Secondary | ICD-10-CM | POA: Diagnosis not present

## 2018-03-02 DIAGNOSIS — H401132 Primary open-angle glaucoma, bilateral, moderate stage: Secondary | ICD-10-CM | POA: Diagnosis not present

## 2018-03-02 DIAGNOSIS — H401112 Primary open-angle glaucoma, right eye, moderate stage: Secondary | ICD-10-CM | POA: Diagnosis not present

## 2018-03-05 ENCOUNTER — Other Ambulatory Visit: Payer: Self-pay | Admitting: Internal Medicine

## 2018-03-07 ENCOUNTER — Other Ambulatory Visit: Payer: Self-pay

## 2018-03-07 DIAGNOSIS — R208 Other disturbances of skin sensation: Secondary | ICD-10-CM | POA: Diagnosis not present

## 2018-03-07 DIAGNOSIS — B373 Candidiasis of vulva and vagina: Secondary | ICD-10-CM | POA: Diagnosis not present

## 2018-03-07 DIAGNOSIS — N390 Urinary tract infection, site not specified: Secondary | ICD-10-CM | POA: Diagnosis not present

## 2018-03-07 DIAGNOSIS — R319 Hematuria, unspecified: Secondary | ICD-10-CM | POA: Diagnosis not present

## 2018-03-07 MED ORDER — VALSARTAN-HYDROCHLOROTHIAZIDE 80-12.5 MG PO TABS: 1.0000 | ORAL_TABLET | Freq: Every day | ORAL | 1 refills | Status: DC

## 2018-03-16 DIAGNOSIS — K2970 Gastritis, unspecified, without bleeding: Secondary | ICD-10-CM | POA: Diagnosis not present

## 2018-03-16 DIAGNOSIS — K259 Gastric ulcer, unspecified as acute or chronic, without hemorrhage or perforation: Secondary | ICD-10-CM | POA: Diagnosis not present

## 2018-03-16 DIAGNOSIS — Z1211 Encounter for screening for malignant neoplasm of colon: Secondary | ICD-10-CM | POA: Diagnosis not present

## 2018-03-16 DIAGNOSIS — K2950 Unspecified chronic gastritis without bleeding: Secondary | ICD-10-CM | POA: Diagnosis not present

## 2018-03-16 DIAGNOSIS — D122 Benign neoplasm of ascending colon: Secondary | ICD-10-CM | POA: Diagnosis not present

## 2018-03-16 DIAGNOSIS — D509 Iron deficiency anemia, unspecified: Secondary | ICD-10-CM | POA: Diagnosis not present

## 2018-03-16 DIAGNOSIS — K621 Rectal polyp: Secondary | ICD-10-CM | POA: Diagnosis not present

## 2018-03-16 HISTORY — PX: COLONOSCOPY WITH ESOPHAGOGASTRODUODENOSCOPY (EGD): SHX5779

## 2018-03-16 LAB — HM COLONOSCOPY

## 2018-03-17 ENCOUNTER — Other Ambulatory Visit: Payer: Self-pay | Admitting: Internal Medicine

## 2018-03-21 NOTE — Progress Notes (Unsigned)
oth

## 2018-03-28 ENCOUNTER — Other Ambulatory Visit: Payer: Self-pay

## 2018-03-28 MED ORDER — VALSARTAN-HYDROCHLOROTHIAZIDE 80-12.5 MG PO TABS: 1.0000 | ORAL_TABLET | Freq: Every day | ORAL | 1 refills | Status: DC

## 2018-04-01 IMAGING — US US TRANSVAGINAL NON-OB
1 series · 14 of 25 positions shown · non-contrast
Comparison: Ultrasound 08/15/2015.

CLINICAL DATA: Hematuria.

EXAM:
TRANSABDOMINAL AND TRANSVAGINAL ULTRASOUND OF PELVIS
TECHNIQUE: Both transabdominal and transvaginal ultrasound examinations of the
pelvis were performed. Transabdominal technique was performed for
global imaging of the pelvis including uterus, ovaries, adnexal
regions, and pelvic cul-de-sac. It was necessary to proceed with
endovaginal exam following the transabdominal exam to visualize the
uterus and ovaries..

[Series 1: us transvaginal non-ob · 0.19mm/px · 14 of 62 slices shown]
[im 1/62]
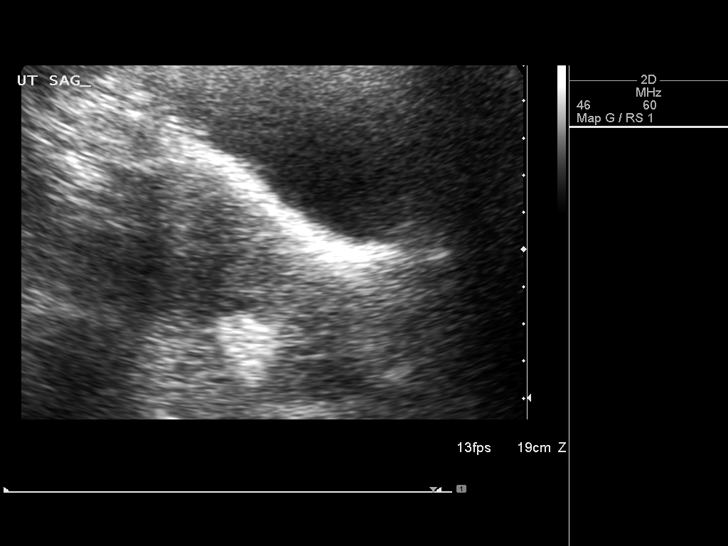
[im 6/62]
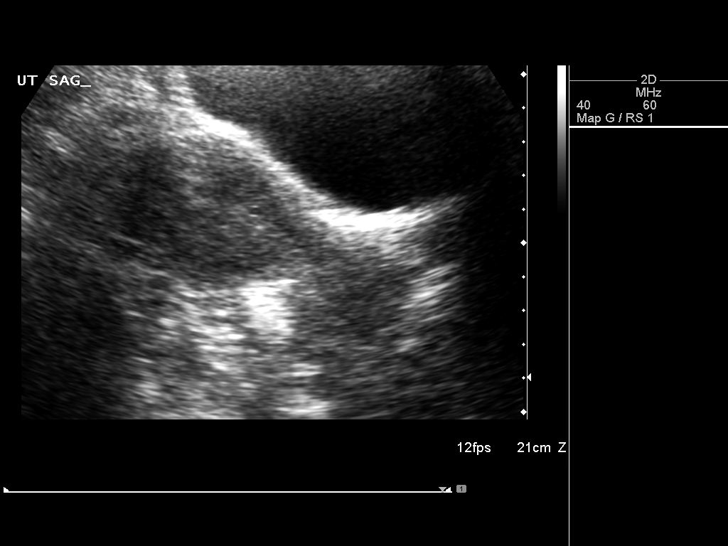
[im 11/62]
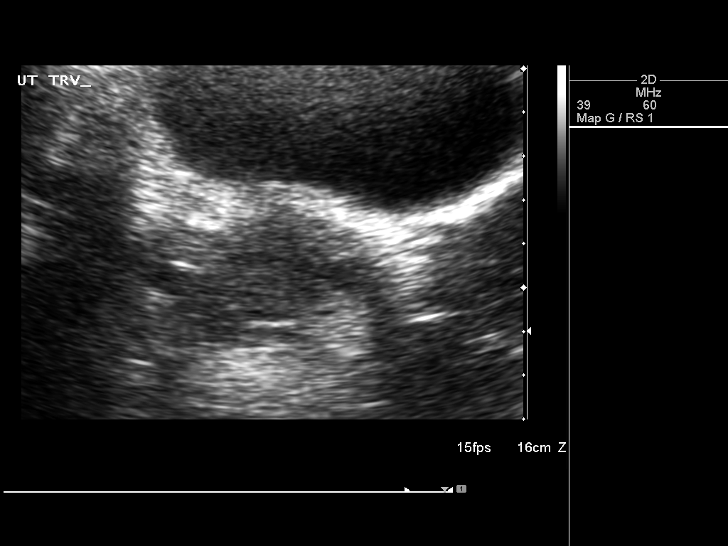
[im 16/62]
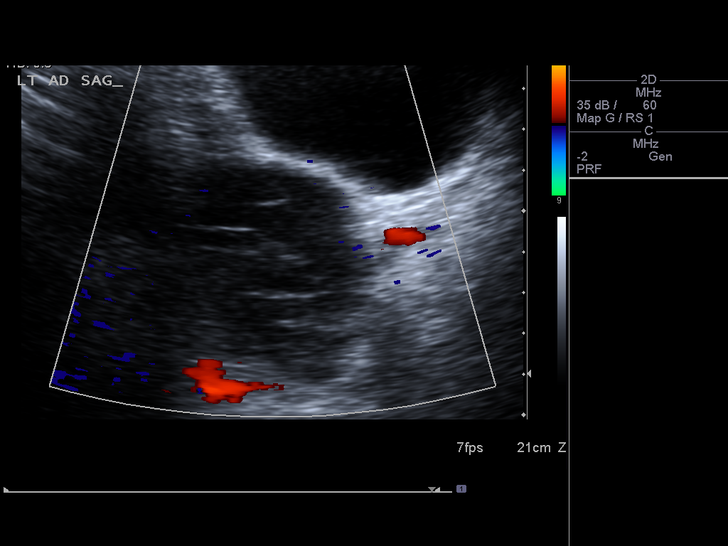
[im 21/62]
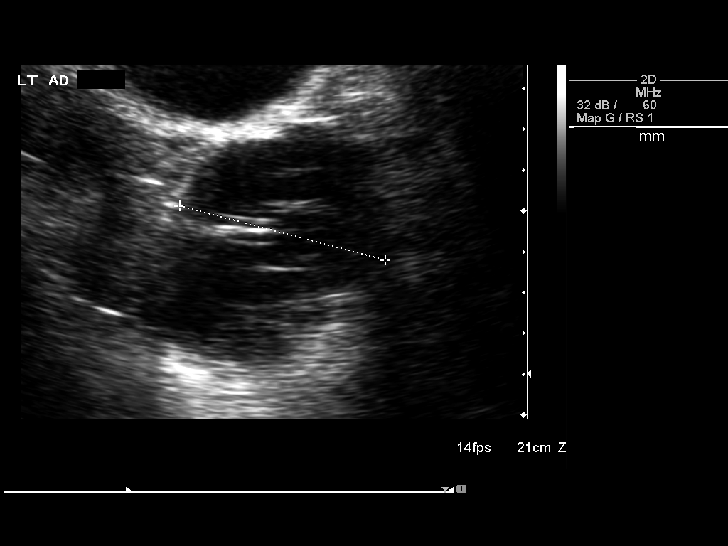
[im 23/62]
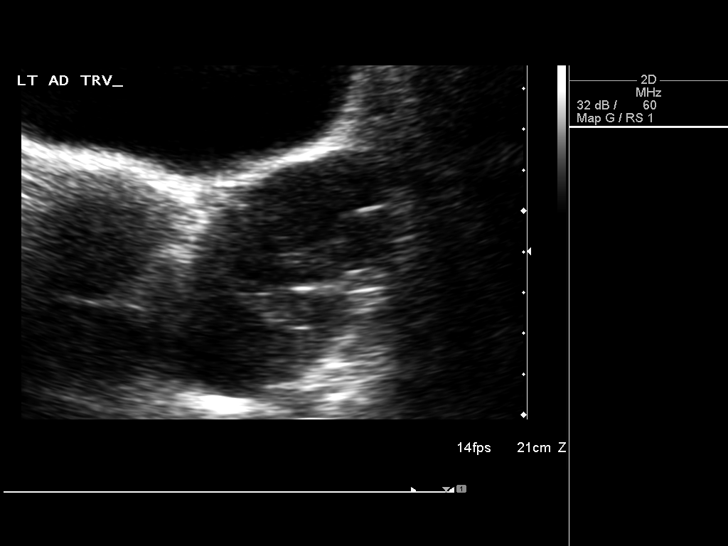
[im 28/62]
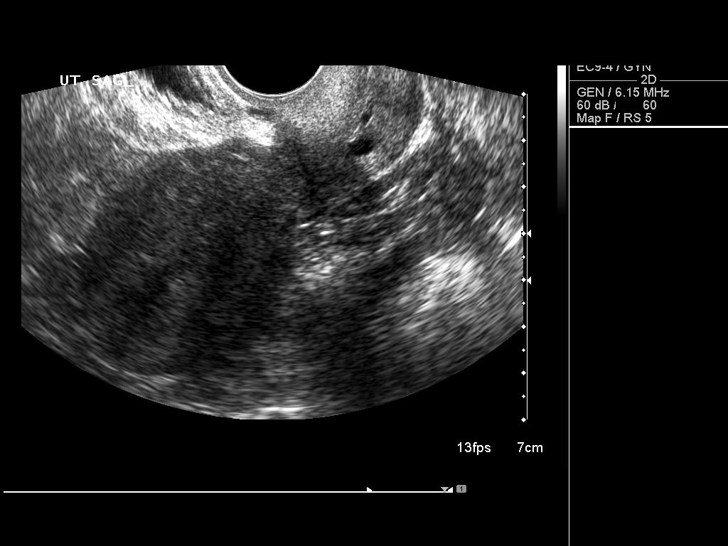
[im 34/62]
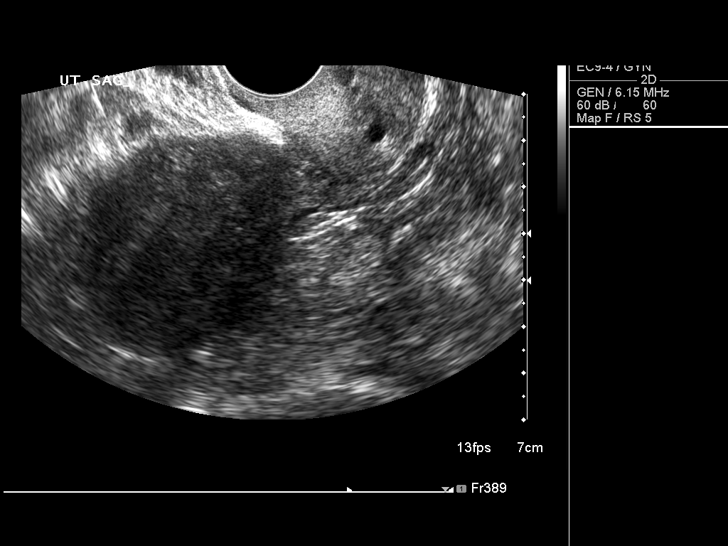
[im 39/62]
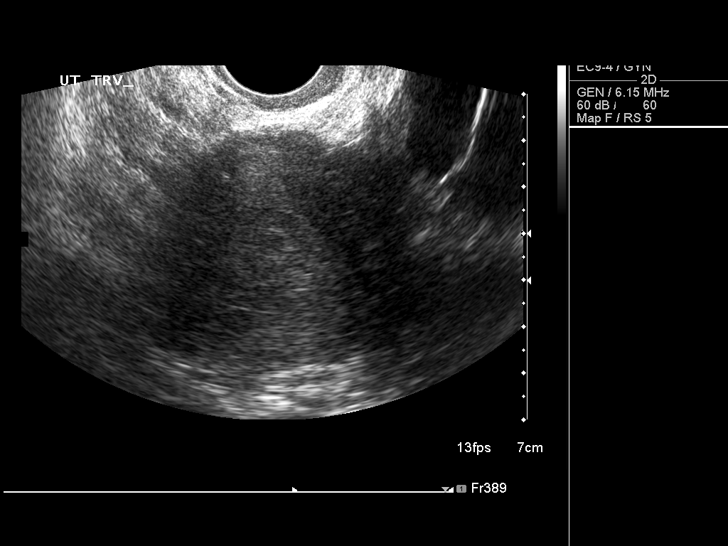
[im 41/62]
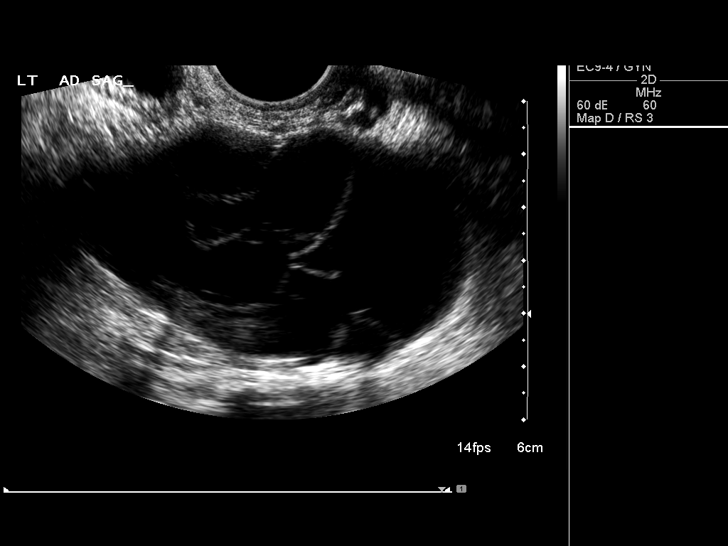
[im 46/62]
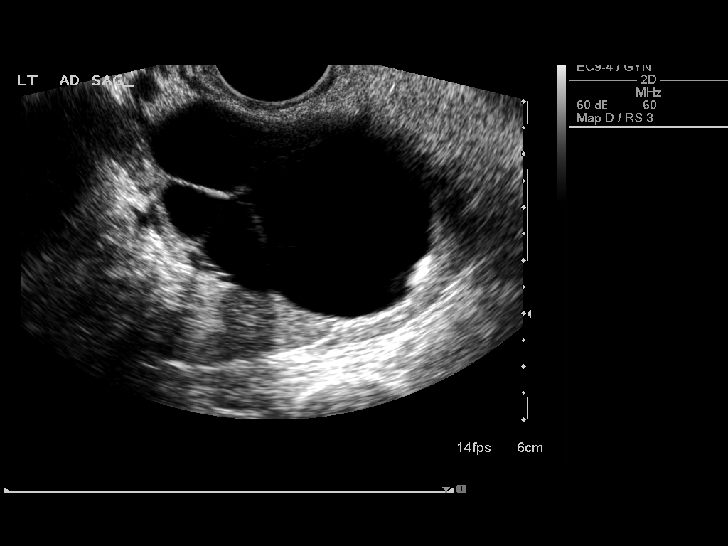
[im 51/62]
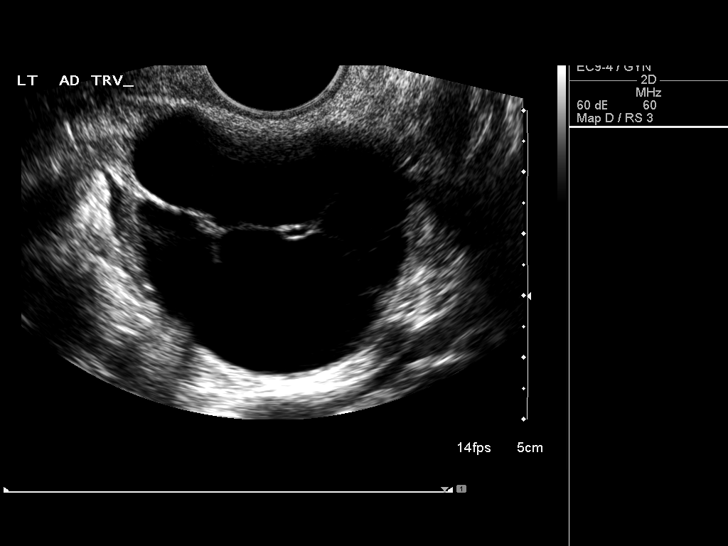
[im 56/62]
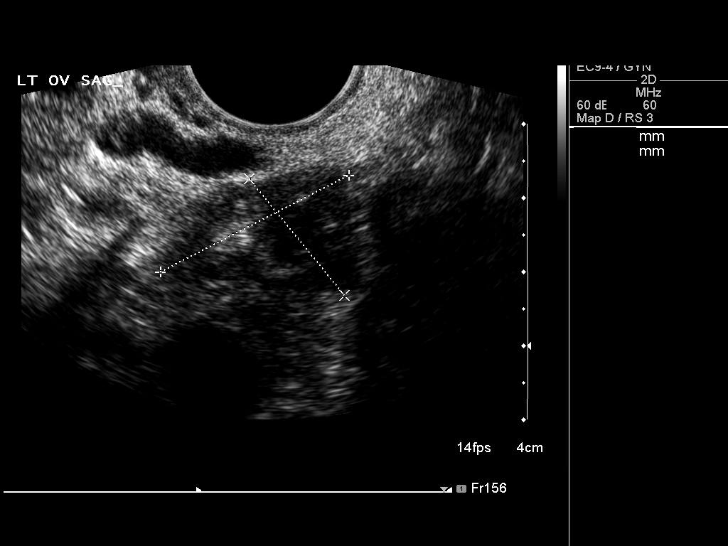
[im 62/62]
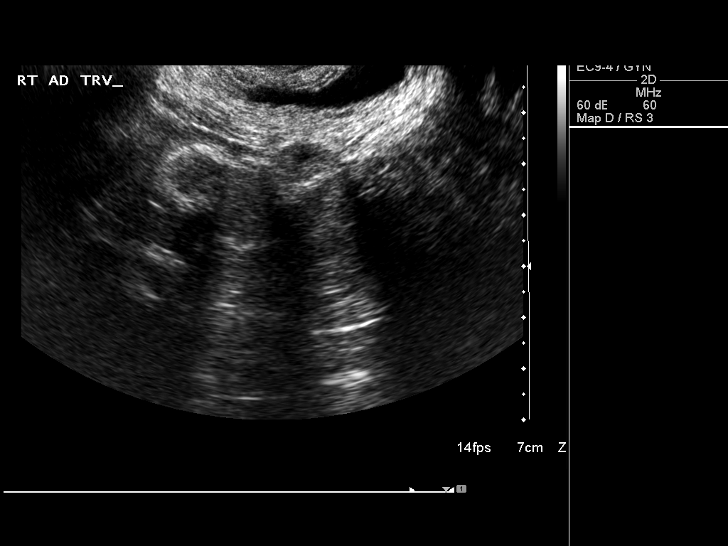

[14 of 25 positions shown; findings below may reference images not displayed]

FINDINGS: Uterus

Measurements: 9.8 x 4.2 x 4.8 cm. No fibroids or other mass
visualized.

Endometrium

Thickness: 2.4 mm.  No focal abnormality visualized.

Right ovary

Measurements: Right ovary not visualized..

Left ovary

Measurements: 2.9 x 2.0 x 2.0 cm. 7.5 x 4.2 x 4.9 cm complex
septated cyst. Possible solid component.

Other findings

No abnormal free fluid.
IMPRESSION: 7.5 x 4.2 x 4.9 cm complex multi-septated left ovarian cyst.
Possible solid component. Gynecologic evaluation is suggested to
exclude cystic ovarian malignancy.

## 2018-04-07 ENCOUNTER — Other Ambulatory Visit: Payer: Self-pay

## 2018-04-08 DIAGNOSIS — Z862 Personal history of diseases of the blood and blood-forming organs and certain disorders involving the immune mechanism: Secondary | ICD-10-CM | POA: Diagnosis not present

## 2018-04-08 DIAGNOSIS — R319 Hematuria, unspecified: Secondary | ICD-10-CM | POA: Diagnosis not present

## 2018-04-19 ENCOUNTER — Encounter: Payer: Self-pay | Admitting: Internal Medicine

## 2018-06-21 ENCOUNTER — Other Ambulatory Visit: Payer: Self-pay | Admitting: Internal Medicine

## 2018-06-22 NOTE — Telephone Encounter (Signed)
Temazepam refill 

## 2018-06-23 IMAGING — CR DG CHEST 2V
2 series · 2 of 2 positions shown · non-contrast
Comparison: 04/09/2004

CLINICAL DATA: Postop bilateral salpingo oophorectomy 11/12/2015
with hypoxia.

EXAM:
CHEST  2 VIEW

[chest pa]
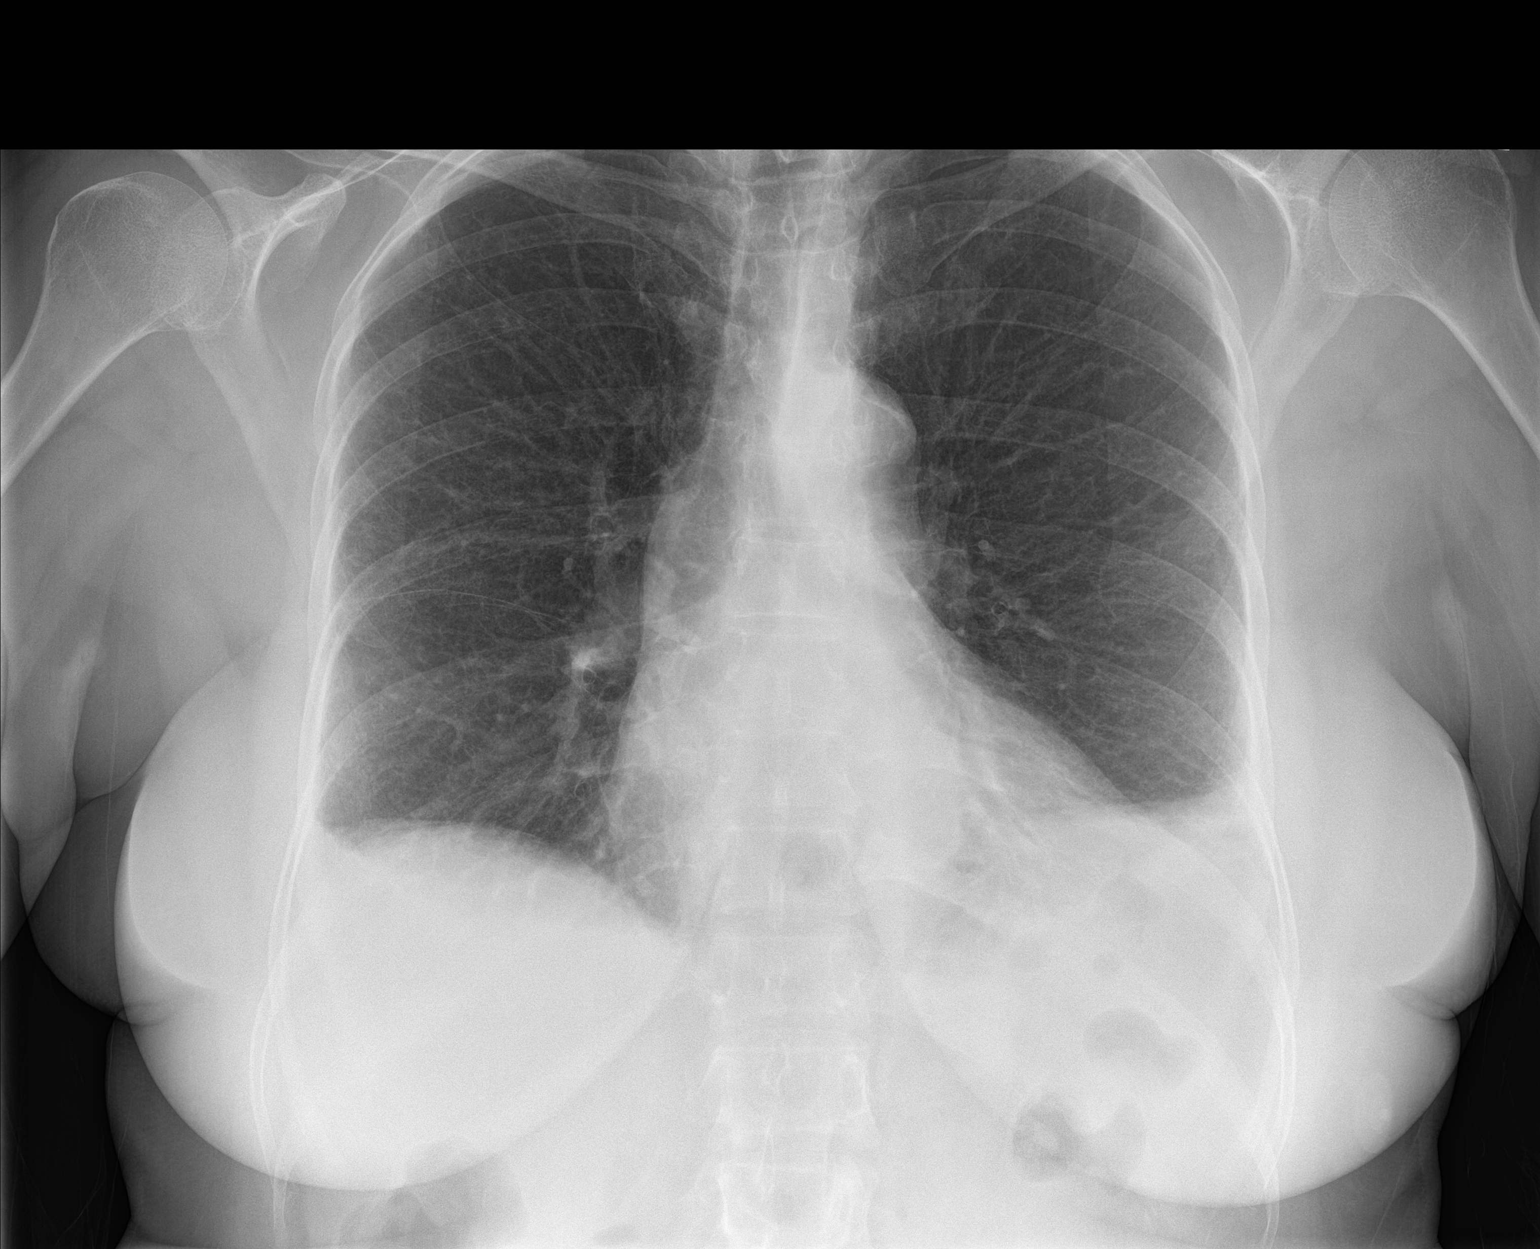

[chest lat]
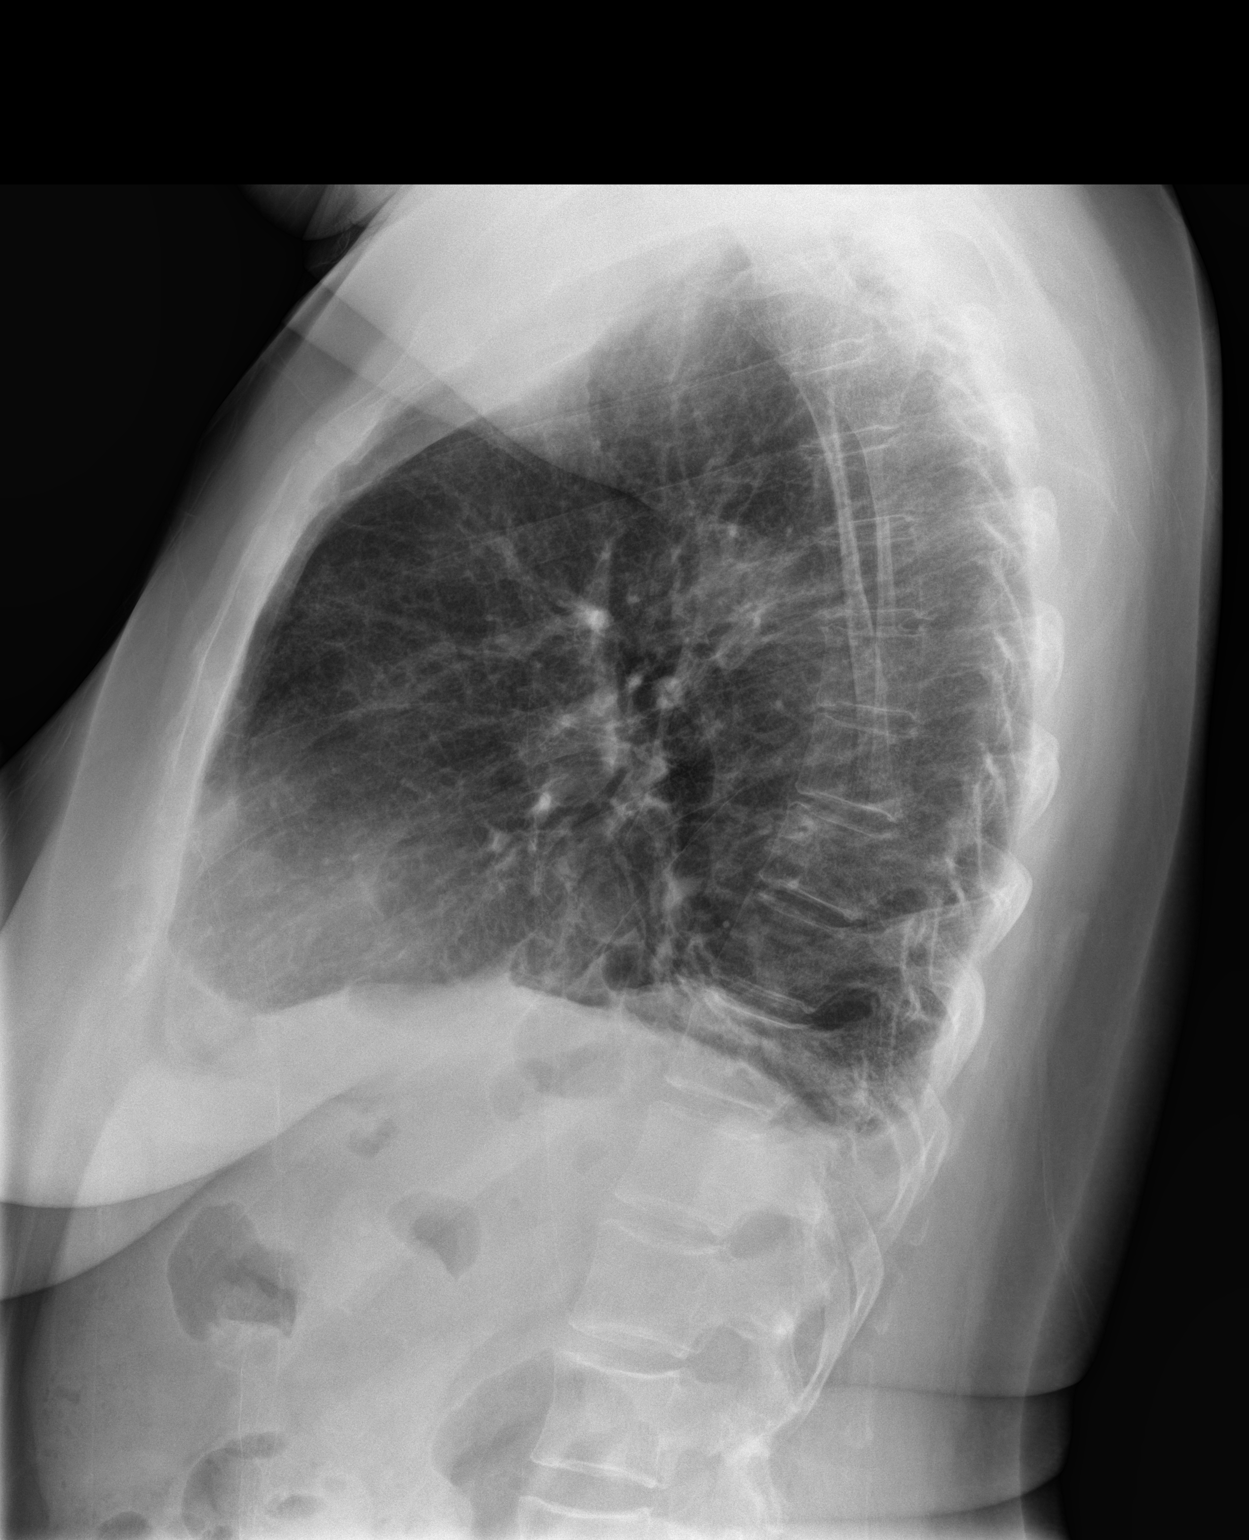

[2 of 2 positions shown; findings below may reference images not displayed]

FINDINGS: Lungs are adequately inflated with mild opacification over the left
base and minimal blunting of the costophrenic angles. Borderline
cardiomegaly unchanged. Mild calcified plaque over the aortic arch.
Bones and soft tissues are within normal
IMPRESSION: Mild left base opacification which may be due to atelectasis versus
infection. Suggestion of a small amount of bilateral pleural fluid
versus pleural parenchymal scarring.

## 2018-06-27 ENCOUNTER — Other Ambulatory Visit: Payer: Self-pay

## 2018-06-27 DIAGNOSIS — I10 Essential (primary) hypertension: Secondary | ICD-10-CM

## 2018-06-27 MED ORDER — VALSARTAN-HYDROCHLOROTHIAZIDE 80-12.5 MG PO TABS: 1.0000 | ORAL_TABLET | Freq: Every day | ORAL | 1 refills | Status: DC

## 2018-07-14 ENCOUNTER — Other Ambulatory Visit: Payer: Self-pay | Admitting: Internal Medicine

## 2018-08-25 ENCOUNTER — Other Ambulatory Visit: Payer: Self-pay

## 2018-08-25 ENCOUNTER — Telehealth: Payer: Self-pay

## 2018-08-25 MED ORDER — DENOSUMAB 60 MG/ML ~~LOC~~ SOSY: 60.0000 mg | PREFILLED_SYRINGE | SUBCUTANEOUS | 1 refills | Status: DC

## 2018-08-25 NOTE — Telephone Encounter (Signed)
I returned the pt's call and notified her that yes her next appt is for her prolia and that [facility] hasn't shipped it to the office yet.

## 2018-08-31 ENCOUNTER — Ambulatory Visit (INDEPENDENT_AMBULATORY_CARE_PROVIDER_SITE_OTHER): Payer: Medicare HMO | Admitting: Internal Medicine

## 2018-08-31 ENCOUNTER — Encounter: Payer: Self-pay | Admitting: Internal Medicine

## 2018-08-31 ENCOUNTER — Other Ambulatory Visit: Payer: Self-pay

## 2018-08-31 ENCOUNTER — Other Ambulatory Visit: Payer: Self-pay | Admitting: Internal Medicine

## 2018-08-31 VITALS — BP 124/76 | HR 68 | Temp 97.5°F | Ht 63.0 in | Wt 206.6 lb

## 2018-08-31 DIAGNOSIS — Z6836 Body mass index (BMI) 36.0-36.9, adult: Secondary | ICD-10-CM

## 2018-08-31 DIAGNOSIS — N183 Chronic kidney disease, stage 3 (moderate): Secondary | ICD-10-CM | POA: Diagnosis not present

## 2018-08-31 DIAGNOSIS — E039 Hypothyroidism, unspecified: Secondary | ICD-10-CM | POA: Diagnosis not present

## 2018-08-31 DIAGNOSIS — M85852 Other specified disorders of bone density and structure, left thigh: Secondary | ICD-10-CM | POA: Diagnosis not present

## 2018-08-31 DIAGNOSIS — I129 Hypertensive chronic kidney disease with stage 1 through stage 4 chronic kidney disease, or unspecified chronic kidney disease: Secondary | ICD-10-CM | POA: Diagnosis not present

## 2018-08-31 DIAGNOSIS — M21611 Bunion of right foot: Secondary | ICD-10-CM | POA: Diagnosis not present

## 2018-08-31 DIAGNOSIS — N182 Chronic kidney disease, stage 2 (mild): Secondary | ICD-10-CM | POA: Diagnosis not present

## 2018-08-31 NOTE — Patient Instructions (Signed)
You may use Aspercreme on the "knot" on your right foot.   If it continues to bother you - please let me know. I can refer you to a foot specialist.   Make sure your shoes do not rub/irritate your foot.   You may get magnesium 250-400mg  and take along with your calcium. Please take at your evening meal.

## 2018-09-01 LAB — T4, FREE: Free T4: 1.73 ng/dL (ref 0.82–1.77)

## 2018-09-01 LAB — CMP14+EGFR
ALT: 11 IU/L (ref 0–32)
AST: 17 IU/L (ref 0–40)
Albumin/Globulin Ratio: 1.3 (ref 1.2–2.2)
Albumin: 4.2 g/dL (ref 3.8–4.8)
Alkaline Phosphatase: 63 IU/L (ref 39–117)
BUN/Creatinine Ratio: 18 (ref 12–28)
BUN: 21 mg/dL (ref 8–27)
Bilirubin Total: 0.9 mg/dL (ref 0.0–1.2)
CO2: 23 mmol/L (ref 20–29)
Calcium: 10.3 mg/dL (ref 8.7–10.3)
Chloride: 101 mmol/L (ref 96–106)
Creatinine, Ser: 1.19 mg/dL — ABNORMAL HIGH (ref 0.57–1.00)
GFR calc Af Amer: 55 mL/min/{1.73_m2} — ABNORMAL LOW (ref >59)
GFR calc non Af Amer: 48 mL/min/{1.73_m2} — ABNORMAL LOW (ref >59)
Globulin, Total: 3.2 g/dL (ref 1.5–4.5)
Glucose: 85 mg/dL (ref 65–99)
Potassium: 4.2 mmol/L (ref 3.5–5.2)
Sodium: 141 mmol/L (ref 134–144)
Total Protein: 7.4 g/dL (ref 6.0–8.5)

## 2018-09-01 LAB — HEMOGLOBIN A1C
Est. average glucose Bld gHb Est-mCnc: 120 mg/dL
Hgb A1c MFr Bld: 5.8 % — ABNORMAL HIGH (ref 4.8–5.6)

## 2018-09-01 LAB — TSH: TSH: 1.75 u[IU]/mL (ref 0.450–4.500)

## 2018-09-04 NOTE — Progress Notes (Signed)
Subjective:     Patient ID: Rachel [name] , female    DOB: Oct 07, 1950 , 68 y.o.   MRN: [id number]   Chief Complaint  Patient presents with  . Hypertension  . Thyroid Problem    HPI  Hypertension  This is a chronic problem. The current episode started more than 1 year ago. The problem has been gradually improving since onset. The problem is controlled. Pertinent negatives include no blurred vision, chest pain, palpitations or shortness of breath. Risk factors for coronary artery disease include dyslipidemia, obesity, post-menopausal state and sedentary lifestyle. The current treatment provides moderate improvement. Identifiable causes of hypertension include a thyroid problem.  Thyroid Problem  Presents for follow-up visit. Patient reports no cold intolerance, constipation, palpitations or visual change. The symptoms have been stable.     Past Medical History:  Diagnosis Date  . Brain aneurysm 2005  . GERD (gastroesophageal reflux disease)   . Hypertension   . Hypothyroidism   . Osteoporosis   . Thyroid disease      Family History  Problem Relation Age of Onset  . Cancer Mother        Breast   . Hypertension Mother   . Cancer Father        Prostate  . Hypertension Father   . Cancer Sister        Breast     Current Outpatient Medications:  .  atorvastatin (LIPITOR) 20 MG tablet, TAKE 1 TABLET BY MOUTH EVERY NIGHT, Disp: 90 tablet, Rfl: 2 .  calcium carbonate (TUMS - DOSED IN MG ELEMENTAL CALCIUM) 500 MG chewable tablet, Chew 4 tablets by mouth 2 (two) times daily as needed for indigestion or heartburn., Disp: , Rfl:  .  Cholecalciferol (VITAMIN D3) 5000 units CAPS, Take 5,000 Units by mouth daily. , Disp: , Rfl:  .  COMBIGAN 0.2-0.5 % ophthalmic solution, Place 1 drop into the right eye 2 (two) times daily., Disp: , Rfl: 6 .  denosumab (PROLIA) 60 MG/ML SOSY injection, Inject 60 mg into the skin every 6 (six) months., Disp: 1 Syringe, Rfl: 1 .  levothyroxine  (SYNTHROID, LEVOTHROID) 100 MCG tablet, Take 100 mcg by mouth daily before breakfast. 1 MON - FRI AND 1/2 TAB SAT-SUN, Disp: , Rfl:  .  pantoprazole (PROTONIX) 40 MG tablet, TAKE 1 TABLET BY MOUTH ONCE A DAY, Disp: 90 tablet, Rfl: 2 .  temazepam (RESTORIL) 15 MG capsule, TAKE 1 CAPSULE BY MOUTH EVERY DAY AT BEDTIME AS NEEDED, Disp: 30 capsule, Rfl: 2 .  valsartan-hydrochlorothiazide (DIOVAN-HCT) 80-12.5 MG tablet, Take 1 tablet by mouth daily., Disp: 90 tablet, Rfl: 1   Allergies  Allergen Reactions  . Dilantin [Phenytoin Sodium Extended] Rash     Review of Systems  Constitutional: Negative.   Eyes: Negative for blurred vision.  Respiratory: Negative.  Negative for shortness of breath.   Cardiovascular: Negative.  Negative for chest pain and palpitations.  Gastrointestinal: Negative.  Negative for constipation.  Endocrine: Negative for cold intolerance.  Musculoskeletal: Positive for arthralgias (she c/o r foot pain. there is some pain with ambulation. denies fall/trauma.).  Neurological: Negative.   Psychiatric/Behavioral: Negative.      Today's Vitals   08/31/18 1136  BP: 124/76  Pulse: 68  Temp: (!) 97.5 F (36.4 C)  TempSrc: Oral  Weight: 206 lb 9.6 oz (93.7 kg)  Height: '5\' 3"'  (1.6 m)  PainSc: 0-No pain   Body mass index is 36.6 kg/m.   Objective:  Physical Exam Vitals signs and nursing  note reviewed.  Constitutional:      Appearance: Normal appearance.  HENT:     Head: Normocephalic and atraumatic.  Cardiovascular:     Rate and Rhythm: Normal rate and regular rhythm.     Heart sounds: Normal heart sounds.  Pulmonary:     Effort: Pulmonary effort is normal.     Breath sounds: Normal breath sounds.  Musculoskeletal:     Comments: Bunion on r foot  Skin:    General: Skin is warm.  Neurological:     General: No focal deficit present.     Mental Status: She is alert.  Psychiatric:        Mood and Affect: Mood normal.        Behavior: Behavior normal.          Assessment And Plan:     1. Hypertensive nephropathy  Well controlled. She will continue with current meds. She is encouraged to avoid adding salt to her foods. She will rto in six months for her next AWV.   - CMP14+EGFR - Hemoglobin A1c  2. Chronic renal disease, stage II  Chronic, I will check a GFR, Cr today. She is encouraged to stay well hydrated.   3. Primary hypothyroidism  I will check thyroid panel and adjust meds as needed.  - TSH - T4, Free  4. Osteopenia of left hip  She was given Prolia today. Her osteoporosis has improved since being on Prolia. Importance of calcium and vitamin D supplementaiton was discussed with the patient. She will rto in six months for her next injection. I will also check calcium levels today.   5. Bunion of great toe of right foot  She does not wish to have podiatry referral at this time. She will let me know if her discomfort persists.   6. Class 2 severe obesity due to excess calories with serious comorbidity and body mass index (BMI) of 36.0 to 36.9 in adult [facility])  Importance of achieving optimal weight to decrease risk of cardiovascular disease and cancers was discussed with the patient in full detail. She is encouraged to start slowly - start with 10 minutes twice daily at least three to four days per week and to gradually build to 30 minutes five days weekly. She was given tips to incorporate more activity into her daily routine - take stairs when possible, park farther away from grocery stores, etc.    Maximino Greenland, MD    THE PATIENT IS ENCOURAGED TO PRACTICE SOCIAL DISTANCING DUE TO THE COVID-19 PANDEMIC.

## 2018-09-08 LAB — PROTEIN ELECTROPHORESIS: Total Protein: 7.4 g/dL (ref 6.0–8.5)

## 2018-09-08 LAB — SPECIMEN STATUS REPORT

## 2018-09-08 LAB — PHOSPHORUS: Phosphorus: 3.6 mg/dL (ref 3.0–4.3)

## 2018-10-13 ENCOUNTER — Other Ambulatory Visit: Payer: Self-pay | Admitting: Internal Medicine

## 2018-10-21 NOTE — Telephone Encounter (Signed)
Temazepam refille

## 2018-10-25 DIAGNOSIS — Z86010 Personal history of colonic polyps: Secondary | ICD-10-CM | POA: Diagnosis not present

## 2018-10-25 DIAGNOSIS — K5730 Diverticulosis of large intestine without perforation or abscess without bleeding: Secondary | ICD-10-CM | POA: Diagnosis not present

## 2018-10-25 DIAGNOSIS — K219 Gastro-esophageal reflux disease without esophagitis: Secondary | ICD-10-CM | POA: Diagnosis not present

## 2018-10-31 DIAGNOSIS — Z803 Family history of malignant neoplasm of breast: Secondary | ICD-10-CM | POA: Diagnosis not present

## 2018-10-31 DIAGNOSIS — Z1231 Encounter for screening mammogram for malignant neoplasm of breast: Secondary | ICD-10-CM | POA: Diagnosis not present

## 2018-10-31 LAB — HM MAMMOGRAPHY: HM Mammogram: NORMAL (ref 0–4)

## 2018-11-11 ENCOUNTER — Encounter: Payer: Self-pay | Admitting: Internal Medicine

## 2018-11-24 DIAGNOSIS — R32 Unspecified urinary incontinence: Secondary | ICD-10-CM | POA: Diagnosis not present

## 2018-11-24 DIAGNOSIS — Z124 Encounter for screening for malignant neoplasm of cervix: Secondary | ICD-10-CM | POA: Diagnosis not present

## 2018-11-24 DIAGNOSIS — Z01419 Encounter for gynecological examination (general) (routine) without abnormal findings: Secondary | ICD-10-CM | POA: Diagnosis not present

## 2018-11-24 DIAGNOSIS — D649 Anemia, unspecified: Secondary | ICD-10-CM | POA: Diagnosis not present

## 2018-12-07 ENCOUNTER — Telehealth: Payer: Self-pay | Admitting: Oncology

## 2018-12-07 NOTE — Telephone Encounter (Signed)
Received a new patient referral from Dr. Mancel Bale at [facility] for anemia. I cld and scheduled Rachel [name] to see Dr. Alen Blew on 8/25 at 11am. She's been made aware to arrive 15 minutes early.

## 2018-12-08 ENCOUNTER — Telehealth: Payer: Self-pay | Admitting: Oncology

## 2018-12-08 NOTE — Telephone Encounter (Signed)
Pt cld and cancelled her appt w/Dr. Alen Blew. She has decided not to r/s at this time.

## 2018-12-10 ENCOUNTER — Other Ambulatory Visit: Payer: Self-pay | Admitting: Internal Medicine

## 2018-12-13 DIAGNOSIS — M25552 Pain in left hip: Secondary | ICD-10-CM | POA: Diagnosis not present

## 2018-12-13 DIAGNOSIS — Z90710 Acquired absence of both cervix and uterus: Secondary | ICD-10-CM | POA: Diagnosis not present

## 2018-12-13 DIAGNOSIS — R29890 Loss of height: Secondary | ICD-10-CM | POA: Diagnosis not present

## 2018-12-13 DIAGNOSIS — M8589 Other specified disorders of bone density and structure, multiple sites: Secondary | ICD-10-CM | POA: Diagnosis not present

## 2018-12-13 LAB — HM DEXA SCAN: HM Dexa Scan: POSITIVE

## 2018-12-14 ENCOUNTER — Other Ambulatory Visit: Payer: Self-pay | Admitting: Internal Medicine

## 2018-12-14 DIAGNOSIS — I10 Essential (primary) hypertension: Secondary | ICD-10-CM

## 2018-12-20 ENCOUNTER — Encounter: Payer: Private Health Insurance - Indemnity | Admitting: Oncology

## 2018-12-26 ENCOUNTER — Encounter: Payer: Self-pay | Admitting: Internal Medicine

## 2018-12-26 ENCOUNTER — Telehealth: Payer: Self-pay

## 2018-12-26 NOTE — Telephone Encounter (Signed)
Voicemail full Results also sent in mychart.    Your bone density results are in.  They are significant for osteopenia, or thinning of the bone. I suggest you walk 30 minutes four to five days per week. Also, I suggest taking calcium and vitamin D supplements daily.   Please let me know if you have any questions. We will repeat this study in 2 year

## 2019-01-10 DIAGNOSIS — H401121 Primary open-angle glaucoma, left eye, mild stage: Secondary | ICD-10-CM | POA: Diagnosis not present

## 2019-01-10 DIAGNOSIS — H43813 Vitreous degeneration, bilateral: Secondary | ICD-10-CM | POA: Diagnosis not present

## 2019-01-10 DIAGNOSIS — H02401 Unspecified ptosis of right eyelid: Secondary | ICD-10-CM | POA: Diagnosis not present

## 2019-01-23 ENCOUNTER — Other Ambulatory Visit: Payer: Self-pay

## 2019-01-23 MED ORDER — LEVOTHYROXINE SODIUM 100 MCG PO TABS: 100.0000 ug | ORAL_TABLET | Freq: Every day | ORAL | 0 refills | Status: DC

## 2019-01-24 ENCOUNTER — Other Ambulatory Visit: Payer: Self-pay

## 2019-01-24 MED ORDER — LEVOTHYROXINE SODIUM 88 MCG PO TABS: 88.0000 ug | ORAL_TABLET | Freq: Every day | ORAL | 0 refills | Status: DC

## 2019-02-09 DIAGNOSIS — R32 Unspecified urinary incontinence: Secondary | ICD-10-CM | POA: Diagnosis not present

## 2019-02-14 DIAGNOSIS — H02401 Unspecified ptosis of right eyelid: Secondary | ICD-10-CM | POA: Diagnosis not present

## 2019-02-14 DIAGNOSIS — H401112 Primary open-angle glaucoma, right eye, moderate stage: Secondary | ICD-10-CM | POA: Diagnosis not present

## 2019-02-14 DIAGNOSIS — H43813 Vitreous degeneration, bilateral: Secondary | ICD-10-CM | POA: Diagnosis not present

## 2019-02-14 DIAGNOSIS — H401121 Primary open-angle glaucoma, left eye, mild stage: Secondary | ICD-10-CM | POA: Diagnosis not present

## 2019-03-01 ENCOUNTER — Other Ambulatory Visit: Payer: Self-pay

## 2019-03-01 ENCOUNTER — Ambulatory Visit (INDEPENDENT_AMBULATORY_CARE_PROVIDER_SITE_OTHER): Payer: Medicare HMO

## 2019-03-01 ENCOUNTER — Encounter: Payer: Self-pay | Admitting: Internal Medicine

## 2019-03-01 ENCOUNTER — Ambulatory Visit (INDEPENDENT_AMBULATORY_CARE_PROVIDER_SITE_OTHER): Payer: Medicare HMO | Admitting: Internal Medicine

## 2019-03-01 ENCOUNTER — Encounter: Payer: Medicare HMO | Admitting: Internal Medicine

## 2019-03-01 VITALS — BP 112/74 | HR 77 | Temp 97.9°F | Ht 62.2 in | Wt 202.8 lb

## 2019-03-01 VITALS — BP 112/74 | HR 77 | Temp 97.9°F | Ht 62.2 in | Wt 202.0 lb

## 2019-03-01 DIAGNOSIS — M85852 Other specified disorders of bone density and structure, left thigh: Secondary | ICD-10-CM

## 2019-03-01 DIAGNOSIS — I129 Hypertensive chronic kidney disease with stage 1 through stage 4 chronic kidney disease, or unspecified chronic kidney disease: Secondary | ICD-10-CM

## 2019-03-01 DIAGNOSIS — M79652 Pain in left thigh: Secondary | ICD-10-CM | POA: Diagnosis not present

## 2019-03-01 DIAGNOSIS — N182 Chronic kidney disease, stage 2 (mild): Secondary | ICD-10-CM | POA: Diagnosis not present

## 2019-03-01 DIAGNOSIS — R7309 Other abnormal glucose: Secondary | ICD-10-CM | POA: Diagnosis not present

## 2019-03-01 DIAGNOSIS — Z6836 Body mass index (BMI) 36.0-36.9, adult: Secondary | ICD-10-CM

## 2019-03-01 DIAGNOSIS — Z23 Encounter for immunization: Secondary | ICD-10-CM | POA: Diagnosis not present

## 2019-03-01 DIAGNOSIS — Z0000 Encounter for general adult medical examination without abnormal findings: Secondary | ICD-10-CM

## 2019-03-01 DIAGNOSIS — E039 Hypothyroidism, unspecified: Secondary | ICD-10-CM | POA: Diagnosis not present

## 2019-03-01 DIAGNOSIS — Z8739 Personal history of other diseases of the musculoskeletal system and connective tissue: Secondary | ICD-10-CM

## 2019-03-01 LAB — POCT URINALYSIS DIPSTICK
Bilirubin, UA: NEGATIVE
Blood, UA: NEGATIVE
Glucose, UA: NEGATIVE
Ketones, UA: NEGATIVE
Nitrite, UA: NEGATIVE
Protein, UA: NEGATIVE
Spec Grav, UA: 1.025 (ref 1.010–1.025)
Urobilinogen, UA: 0.2 E.U./dL
pH, UA: 7 (ref 5.0–8.0)

## 2019-03-01 LAB — POCT UA - MICROALBUMIN
Albumin/Creatinine Ratio, Urine, POC: <30
Creatinine, POC: 300 mg/dL
Microalbumin Ur, POC: 10 mg/L

## 2019-03-01 MED ORDER — DENOSUMAB 60 MG/ML ~~LOC~~ SOSY
60.0000 mg | PREFILLED_SYRINGE | Freq: Once | SUBCUTANEOUS | Status: AC
  Administered 2019-03-01: 60 mg via SUBCUTANEOUS

## 2019-03-01 NOTE — Patient Instructions (Signed)
Rachel [name] , Thank you for taking time to come for your Medicare Wellness Visit. I appreciate your ongoing commitment to your health goals. Please review the following plan we discussed and let me know if I can assist you in the future.   Screening recommendations/referrals: Colonoscopy: 02/2018 Mammogram: 10/2018 Bone Density: 11/2018 Recommended yearly ophthalmology/optometry visit for glaucoma screening and checkup Recommended yearly dental visit for hygiene and checkup  Vaccinations: Influenza vaccine: today Pneumococcal vaccine: 02/2013 Tdap vaccine: 10/2016 Shingles vaccine: discussed    Advanced directives: Advance directive discussed with you today. Even though you declined this today please call our office should you change your mind and we can give you the proper paperwork for you to fill out.   Conditions/risks identified: obesity  Next appointment:    Preventive Care 55 Years and Older, Female Preventive care refers to lifestyle choices and visits with your health care provider that can promote health and wellness. What does preventive care include?  A yearly physical exam. This is also called an annual well check.  Dental exams once or twice a year.  Routine eye exams. Ask your health care provider how often you should have your eyes checked.  Personal lifestyle choices, including:  Daily care of your teeth and gums.  Regular physical activity.  Eating a healthy diet.  Avoiding tobacco and drug use.  Limiting alcohol use.  Practicing safe sex.  Taking low-dose aspirin every day.  Taking vitamin and mineral supplements as recommended by your health care provider. What happens during an annual well check? The services and screenings done by your health care provider during your annual well check will depend on your age, overall health, lifestyle risk factors, and family history of disease. Counseling  Your health care provider may ask you questions about  your:  Alcohol use.  Tobacco use.  Drug use.  Emotional well-being.  Home and relationship well-being.  Sexual activity.  Eating habits.  History of falls.  Memory and ability to understand (cognition).  Work and work Statistician.  Reproductive health. Screening  You may have the following tests or measurements:  Height, weight, and BMI.  Blood pressure.  Lipid and cholesterol levels. These may be checked every 5 years, or more frequently if you are over 63 years old.  Skin check.  Lung cancer screening. You may have this screening every year starting at age 72 if you have a 30-pack-year history of smoking and currently smoke or have quit within the past 15 years.  Fecal occult blood test (FOBT) of the stool. You may have this test every year starting at age 47.  Flexible sigmoidoscopy or colonoscopy. You may have a sigmoidoscopy every 5 years or a colonoscopy every 10 years starting at age 34.  Hepatitis C blood test.  Hepatitis B blood test.  Sexually transmitted disease (STD) testing.  Diabetes screening. This is done by checking your blood sugar (glucose) after you have not eaten for a while (fasting). You may have this done every 1-3 years.  Bone density scan. This is done to screen for osteoporosis. You may have this done starting at age 61.  Mammogram. This may be done every 1-2 years. Talk to your health care provider about how often you should have regular mammograms. Talk with your health care provider about your test results, treatment options, and if necessary, the need for more tests. Vaccines  Your health care provider may recommend certain vaccines, such as:  Influenza vaccine. This is recommended every year.  Tetanus, diphtheria, and acellular pertussis (Tdap, Td) vaccine. You may need a Td booster every 10 years.  Zoster vaccine. You may need this after age 82.  Pneumococcal 13-valent conjugate (PCV13) vaccine. One dose is recommended  after age 74.  Pneumococcal polysaccharide (PPSV23) vaccine. One dose is recommended after age 59. Talk to your health care provider about which screenings and vaccines you need and how often you need them. This information is not intended to replace advice given to you by your health care provider. Make sure you discuss any questions you have with your health care provider. Document Released: 05/10/2015 Document Revised: 01/01/2016 Document Reviewed: 02/12/2015 Elsevier Interactive Patient Education  2017 [place] Prevention in the Home Falls can cause injuries. They can happen to people of all ages. There are many things you can do to make your home safe and to help prevent falls. What can I do on the outside of my home?  Regularly fix the edges of walkways and driveways and fix any cracks.  Remove anything that might make you trip as you walk through a door, such as a raised step or threshold.  Trim any bushes or trees on the path to your home.  Use bright outdoor lighting.  Clear any walking paths of anything that might make someone trip, such as rocks or tools.  Regularly check to see if handrails are loose or broken. Make sure that both sides of any steps have handrails.  Any raised decks and porches should have guardrails on the edges.  Have any leaves, snow, or ice cleared regularly.  Use sand or salt on walking paths during winter.  Clean up any spills in your garage right away. This includes oil or grease spills. What can I do in the bathroom?  Use night lights.  Install grab bars by the toilet and in the tub and shower. Do not use towel bars as grab bars.  Use non-skid mats or decals in the tub or shower.  If you need to sit down in the shower, use a plastic, non-slip stool.  Keep the floor dry. Clean up any water that spills on the floor as soon as it happens.  Remove soap buildup in the tub or shower regularly.  Attach bath mats securely with  double-sided non-slip rug tape.  Do not have throw rugs and other things on the floor that can make you trip. What can I do in the bedroom?  Use night lights.  Make sure that you have a light by your bed that is easy to reach.  Do not use any sheets or blankets that are too big for your bed. They should not hang down onto the floor.  Have a firm chair that has side arms. You can use this for support while you get dressed.  Do not have throw rugs and other things on the floor that can make you trip. What can I do in the kitchen?  Clean up any spills right away.  Avoid walking on wet floors.  Keep items that you use a lot in easy-to-reach places.  If you need to reach something above you, use a strong step stool that has a grab bar.  Keep electrical cords out of the way.  Do not use floor polish or wax that makes floors slippery. If you must use wax, use non-skid floor wax.  Do not have throw rugs and other things on the floor that can make you trip. What can I do  with my stairs?  Do not leave any items on the stairs.  Make sure that there are handrails on both sides of the stairs and use them. Fix handrails that are broken or loose. Make sure that handrails are as long as the stairways.  Check any carpeting to make sure that it is firmly attached to the stairs. Fix any carpet that is loose or worn.  Avoid having throw rugs at the top or bottom of the stairs. If you do have throw rugs, attach them to the floor with carpet tape.  Make sure that you have a light switch at the top of the stairs and the bottom of the stairs. If you do not have them, ask someone to add them for you. What else can I do to help prevent falls?  Wear shoes that:  Do not have high heels.  Have rubber bottoms.  Are comfortable and fit you well.  Are closed at the toe. Do not wear sandals.  If you use a stepladder:  Make sure that it is fully opened. Do not climb a closed stepladder.  Make  sure that both sides of the stepladder are locked into place.  Ask someone to hold it for you, if possible.  Clearly mark and make sure that you can see:  Any grab bars or handrails.  First and last steps.  Where the edge of each step is.  Use tools that help you move around (mobility aids) if they are needed. These include:  Canes.  Walkers.  Scooters.  Crutches.  Turn on the lights when you go into a dark area. Replace any light bulbs as soon as they burn out.  Set up your furniture so you have a clear path. Avoid moving your furniture around.  If any of your floors are uneven, fix them.  If there are any pets around you, be aware of where they are.  Review your medicines with your doctor. Some medicines can make you feel dizzy. This can increase your chance of falling. Ask your doctor what other things that you can do to help prevent falls. This information is not intended to replace advice given to you by your health care provider. Make sure you discuss any questions you have with your health care provider. Document Released: 02/07/2009 Document Revised: 09/19/2015 Document Reviewed: 05/18/2014 Elsevier Interactive Patient Education  2017 [employer].

## 2019-03-01 NOTE — Progress Notes (Signed)
Subjective:   Rachel [name] is a 68 y.o. female who presents for Medicare Annual (Subsequent) preventive examination.  Review of Systems:  n/a Cardiac Risk Factors include: advanced age (>65men, >21 women);dyslipidemia;hypertension     Objective:     Vitals: BP 112/74 (BP Location: Left Arm, Patient Position: Sitting, Cuff Size: Normal)   Pulse 77   Temp 97.9 F (36.6 C) (Oral)   Ht 5' 2.2" (1.58 m)   Wt 202 lb (91.6 kg)   BMI 36.71 kg/m   Body mass index is 36.71 kg/m.  Advanced Directives 03/01/2019 11/18/2015 11/12/2015 11/01/2015 09/20/2015  Does Patient Have a Medical Advance Directive? No No No No No  Would patient like information on creating a medical advance directive? - Yes - Scientist, clinical (histocompatibility and immunogenetics) given Yes - Scientist, clinical (histocompatibility and immunogenetics) given Yes - Scientist, clinical (histocompatibility and immunogenetics) given No - patient declined information    Tobacco Social History   Tobacco Use  Smoking Status Former Smoker  . Packs/day: 1.00  . Years: 25.00  . Pack years: 25.00  . Types: Cigarettes  . Quit date: 04/02/2004  . Years since quitting: 14.9  Smokeless Tobacco Never Used     Counseling given: Not Answered   Clinical Intake:  Pre-visit preparation completed: Yes  Pain : No/denies pain     Nutritional Status: BMI > 30  Obese Nutritional Risks: None Diabetes: No  How often do you need to have someone help you when you read instructions, pamphlets, or other written materials from your doctor or pharmacy?: 1 - Never What is the last grade level you completed in school?: 2 years college  Interpreter Needed?: No  Information entered by :: NAllen LPN  Past Medical History:  Diagnosis Date  . Brain aneurysm 2005  . GERD (gastroesophageal reflux disease)   . Hypertension   . Hypothyroidism   . Osteoporosis   . Thyroid disease    Past Surgical History:  Procedure Laterality Date  . BRAIN SURGERY     2005    DR CABBELL   . COLONOSCOPY    . LAPAROSCOPIC BILATERAL SALPINGO OOPHERECTOMY  Bilateral 11/12/2015   Procedure: LAPAROSCOPIC BILATERAL SALPINGO OOPHORECTOMY;  Surgeon: Everett Graff, MD;  Location: [facility];  Service: Gynecology;  Laterality: Bilateral;  . LAPAROSCOPY N/A 11/22/2015   Procedure: LAPAROSCOPY DIAGNOSTIC with repair of serosal tear;  Surgeon: Everett Graff, MD;  Location: [facility];  Service: Gynecology;  Laterality: N/A;  . RADIOACTIVE SEED GUIDED EXCISIONAL BREAST BIOPSY Left 09/26/2015   Procedure: LEFT RADIOACTIVE SEED GUIDED EXCISIONAL BREAST BIOPSY;  Surgeon: Stark Klein, MD;  Location: [facility];  Service: General;  Laterality: Left;  . WISDOM TOOTH EXTRACTION     Family History  Problem Relation Age of Onset  . Cancer Mother        Breast   . Hypertension Mother   . Cancer Father        Prostate  . Hypertension Father   . Cancer Sister        Breast   Social History   Socioeconomic History  . Marital status: Married    Spouse name: Not on file  . Number of children: Not on file  . Years of education: Not on file  . Highest education level: Not on file  Occupational History  . Occupation: retired  Scientific laboratory technician  . Financial resource strain: Not hard at all  . Food insecurity    Worry: Never true    Inability: Never true  . Transportation needs    Medical:  No    Non-medical: No  Tobacco Use  . Smoking status: Former Smoker    Packs/day: 1.00    Years: 25.00    Pack years: 25.00    Types: Cigarettes    Quit date: 04/02/2004    Years since quitting: 14.9  . Smokeless tobacco: Never Used  Substance and Sexual Activity  . Alcohol use: No  . Drug use: No  . Sexual activity: Yes    Birth control/protection: Post-menopausal  Lifestyle  . Physical activity    Days per week: 7 days    Minutes per session: 30 min  . Stress: Not at all  Relationships  . Social Herbalist on phone: Not on file    Gets together: Not on file    Attends religious service: Not on file    Active member of club or organization: Not on file     Attends meetings of clubs or organizations: Not on file    Relationship status: Not on file  Other Topics Concern  . Not on file  Social History Narrative  . Not on file    Outpatient Encounter Medications as of 03/01/2019  Medication Sig  . atorvastatin (LIPITOR) 20 MG tablet TAKE 1 TABLET BY MOUTH EVERY NIGHT  . calcium carbonate (TUMS - DOSED IN MG ELEMENTAL CALCIUM) 500 MG chewable tablet Chew 4 tablets by mouth 2 (two) times daily as needed for indigestion or heartburn.  . Cholecalciferol (VITAMIN D3) 5000 units CAPS Take 5,000 Units by mouth daily.   . COMBIGAN 0.2-0.5 % ophthalmic solution Place 1 drop into the right eye 2 (two) times daily.  Marland Kitchen denosumab (PROLIA) 60 MG/ML SOSY injection Inject 60 mg into the skin every 6 (six) months.  . levothyroxine (SYNTHROID) 88 MCG tablet Take 1 tablet (88 mcg total) by mouth daily before breakfast. 1 MON - FRI AND 1/2 TAB SAT-SUN  . pantoprazole (PROTONIX) 40 MG tablet TAKE 1 TABLET BY MOUTH ONCE A DAY  . temazepam (RESTORIL) 15 MG capsule TAKE 1 CAPSULE BY MOUTH EVERY DAY AT BEDTIME AS NEEDED  . valsartan-hydrochlorothiazide (DIOVAN-HCT) 80-12.5 MG tablet TAKE 1 TABLET BY MOUTH EVERY DAY   No facility-administered encounter medications on file as of 03/01/2019.     Activities of Daily Living In your present state of health, do you have any difficulty performing the following activities: 03/01/2019 03/01/2019  Hearing? N N  Vision? N N  Difficulty concentrating or making decisions? N N  Walking or climbing stairs? N N  Dressing or bathing? N N  Doing errands, shopping? N N  Preparing Food and eating ? N -  Using the Toilet? N -  In the past six months, have you accidently leaked urine? Y -  Comment taking medication for it -  Do you have problems with loss of bowel control? N -  Managing your Medications? N -  Managing your Finances? N -  Housekeeping or managing your Housekeeping? N -  Some recent data might be hidden    Patient  Care Team: Glendale Chard, MD as PCP - General (Internal Medicine)    Assessment:   This is a routine wellness examination for Rachel [name].  Exercise Activities and Dietary recommendations Current Exercise Habits: Home exercise routine, Type of exercise: treadmill, Time (Minutes): 30, Frequency (Times/Week): 7, Weekly Exercise (Minutes/Week): 210  Goals    . Weight (lb) < 200 lb (90.7 kg)     03/01/2019, lose weight to feel better and get off some medications  Fall Risk Fall Risk  03/01/2019 03/01/2019 08/31/2018 02/25/2018 02/25/2018  Falls in the past year? 0 0 0 0 0  Number falls in past yr: 0 - - - -  Risk for fall due to : Medication side effect - - - -  Follow up Falls evaluation completed;Education provided;Falls prevention discussed - - - -   Is the patient's home free of loose throw rugs in walkways, pet beds, electrical cords, etc?   yes      Grab bars in the bathroom? no      Handrails on the stairs?   n/a      Adequate lighting?   yes  Timed Get Up and Go performed: n/a  Depression Screen PHQ 2/9 Scores 03/01/2019 08/31/2018 02/25/2018  PHQ - 2 Score 0 0 0     Cognitive Function     6CIT Screen 03/01/2019  What Year? 0 points  What month? 0 points  What time? 0 points  Count back from 20 0 points  Months in reverse 0 points  Repeat phrase 4 points  Total Score 4    Immunization History  Administered Date(s) Administered  . Influenza, High Dose Seasonal PF 02/25/2018, 03/01/2019  . Influenza-Unspecified 01/25/2017    Qualifies for Shingles Vaccine? yes  Screening Tests Health Maintenance  Topic Date Due  . PNA vac Low Risk Adult (2 of 2 - PPSV23) 09/03/2016  . MAMMOGRAM  10/30/2020  . TETANUS/TDAP  11/12/2026  . COLONOSCOPY  03/16/2028  . INFLUENZA VACCINE  Completed  . DEXA SCAN  Completed  . Hepatitis C Screening  Completed    Cancer Screenings: Lung: Low Dose CT Chest recommended if Age 33-80 years, 30 pack-year currently smoking OR have quit  w/in 15years. Patient does not qualify. Breast:  Up to date on Mammogram? Yes   Up to date of Bone Density/Dexa? Yes Colorectal: up to date  Additional Screenings: : Hepatitis C Screening: 02/2018     Plan:    Patient wants to lose weight to feel better and to get off of some medication.   I have personally reviewed and noted the following in the patient's chart:   . Medical and social history . Use of alcohol, tobacco or illicit drugs  . Current medications and supplements . Functional ability and status . Nutritional status . Physical activity . Advanced directives . List of other physicians . Hospitalizations, surgeries, and ER visits in previous 12 months . Vitals . Screenings to include cognitive, depression, and falls . Referrals and appointments  In addition, I have reviewed and discussed with patient certain preventive protocols, quality metrics, and best practice recommendations. A written personalized care plan for preventive services as well as general preventive health recommendations were provided to patient.     Kellie Simmering, LPN  [id number]

## 2019-03-01 NOTE — Patient Instructions (Signed)
Get over the counter Lidocaine patches for thigh pain   Health Maintenance, Female Adopting a healthy lifestyle and getting preventive care are important in promoting health and wellness. Ask your health care provider about:  The right schedule for you to have regular tests and exams.  Things you can do on your own to prevent diseases and keep yourself healthy. What should I know about diet, weight, and exercise? Eat a healthy diet   Eat a diet that includes plenty of vegetables, fruits, low-fat dairy products, and lean protein.  Do not eat a lot of foods that are high in solid fats, added sugars, or sodium. Maintain a healthy weight Body mass index (BMI) is used to identify weight problems. It estimates body fat based on height and weight. Your health care provider can help determine your BMI and help you achieve or maintain a healthy weight. Get regular exercise Get regular exercise. This is one of the most important things you can do for your health. Most adults should:  Exercise for at least 150 minutes each week. The exercise should increase your heart rate and make you sweat (moderate-intensity exercise).  Do strengthening exercises at least twice a week. This is in addition to the moderate-intensity exercise.  Spend less time sitting. Even light physical activity can be beneficial. Watch cholesterol and blood lipids Have your blood tested for lipids and cholesterol at 67 years of age, then have this test every 5 years. Have your cholesterol levels checked more often if:  Your lipid or cholesterol levels are high.  You are older than 67 years of age.  You are at high risk for heart disease. What should I know about cancer screening? Depending on your health history and family history, you may need to have cancer screening at various ages. This may include screening for:  Breast cancer.  Cervical cancer.  Colorectal cancer.  Skin cancer.  Lung cancer. What should I  know about heart disease, diabetes, and high blood pressure? Blood pressure and heart disease  High blood pressure causes heart disease and increases the risk of stroke. This is more likely to develop in people who have high blood pressure readings, are of African descent, or are overweight.  Have your blood pressure checked: ? Every 3-5 years if you are 26-81 years of age. ? Every year if you are 4 years old or older. Diabetes Have regular diabetes screenings. This checks your fasting blood sugar level. Have the screening done:  Once every three years after age 69 if you are at a normal weight and have a low risk for diabetes.  More often and at a younger age if you are overweight or have a high risk for diabetes. What should I know about preventing infection? Hepatitis B If you have a higher risk for hepatitis B, you should be screened for this virus. Talk with your health care provider to find out if you are at risk for hepatitis B infection. Hepatitis C Testing is recommended for:  Everyone born from [birth year] through 1964.  Anyone with known risk factors for hepatitis C. Sexually transmitted infections (STIs)  Get screened for STIs, including gonorrhea and chlamydia, if: ? You are sexually active and are younger than 67 years of age. ? You are older than 67 years of age and your health care provider tells you that you are at risk for this type of infection. ? Your sexual activity has changed since you were last screened, and you are at increased  risk for chlamydia or gonorrhea. Ask your health care provider if you are at risk.  Ask your health care provider about whether you are at high risk for HIV. Your health care provider may recommend a prescription medicine to help prevent HIV infection. If you choose to take medicine to prevent HIV, you should first get tested for HIV. You should then be tested every 3 months for as long as you are taking the medicine. Pregnancy  If you are  about to stop having your period (premenopausal) and you may become pregnant, seek counseling before you get pregnant.  Take 400 to 800 micrograms (mcg) of folic acid every day if you become pregnant.  Ask for birth control (contraception) if you want to prevent pregnancy. Osteoporosis and menopause Osteoporosis is a disease in which the bones lose minerals and strength with aging. This can result in bone fractures. If you are 59 years old or older, or if you are at risk for osteoporosis and fractures, ask your health care provider if you should:  Be screened for bone loss.  Take a calcium or vitamin D supplement to lower your risk of fractures.  Be given hormone replacement therapy (HRT) to treat symptoms of menopause. Follow these instructions at home: Lifestyle  Do not use any products that contain nicotine or tobacco, such as cigarettes, e-cigarettes, and chewing tobacco. If you need help quitting, ask your health care provider.  Do not use street drugs.  Do not share needles.  Ask your health care provider for help if you need support or information about quitting drugs. Alcohol use  Do not drink alcohol if: ? Your health care provider tells you not to drink. ? You are pregnant, may be pregnant, or are planning to become pregnant.  If you drink alcohol: ? Limit how much you use to 0-1 drink a day. ? Limit intake if you are breastfeeding.  Be aware of how much alcohol is in your drink. In the [place] one drink equals one 12 oz bottle of beer (355 mL), one 5 oz glass of wine (148 mL), or one 1 oz glass of hard liquor (44 mL). General instructions  Schedule regular health, dental, and eye exams.  Stay current with your vaccines.  Tell your health care provider if: ? You often feel depressed. ? You have ever been abused or do not feel safe at home. Summary  Adopting a healthy lifestyle and getting preventive care are important in promoting health and wellness.  Follow  your health care provider's instructions about healthy diet, exercising, and getting tested or screened for diseases.  Follow your health care provider's instructions on monitoring your cholesterol and blood pressure. This information is not intended to replace advice given to you by your health care provider. Make sure you discuss any questions you have with your health care provider. Document Released: 10/27/2010 Document Revised: 04/06/2018 Document Reviewed: 04/06/2018 Elsevier Patient Education  2020 [employer].

## 2019-03-02 ENCOUNTER — Other Ambulatory Visit: Payer: Self-pay | Admitting: Internal Medicine

## 2019-03-02 LAB — LIPID PANEL
Chol/HDL Ratio: 3.2 ratio (ref 0.0–4.4)
Cholesterol, Total: 163 mg/dL (ref 100–199)
HDL: 51 mg/dL (ref >39)
LDL Chol Calc (NIH): 97 mg/dL (ref 0–99)
Triglycerides: 80 mg/dL (ref 0–149)
VLDL Cholesterol Cal: 15 mg/dL (ref 5–40)

## 2019-03-02 LAB — CMP14+EGFR
ALT: 11 IU/L (ref 0–32)
AST: 18 IU/L (ref 0–40)
Albumin/Globulin Ratio: 1.4 (ref 1.2–2.2)
Albumin: 4.2 g/dL (ref 3.8–4.8)
Alkaline Phosphatase: 65 IU/L (ref 39–117)
BUN/Creatinine Ratio: 15 (ref 12–28)
BUN: 16 mg/dL (ref 8–27)
Bilirubin Total: 0.8 mg/dL (ref 0.0–1.2)
CO2: 27 mmol/L (ref 20–29)
Calcium: 10 mg/dL (ref 8.7–10.3)
Chloride: 102 mmol/L (ref 96–106)
Creatinine, Ser: 1.04 mg/dL — ABNORMAL HIGH (ref 0.57–1.00)
GFR calc Af Amer: 64 mL/min/{1.73_m2} (ref >59)
GFR calc non Af Amer: 56 mL/min/{1.73_m2} — ABNORMAL LOW (ref >59)
Globulin, Total: 3 g/dL (ref 1.5–4.5)
Glucose: 92 mg/dL (ref 65–99)
Potassium: 4.5 mmol/L (ref 3.5–5.2)
Sodium: 141 mmol/L (ref 134–144)
Total Protein: 7.2 g/dL (ref 6.0–8.5)

## 2019-03-02 LAB — T4, FREE: Free T4: 2.15 ng/dL — ABNORMAL HIGH (ref 0.82–1.77)

## 2019-03-02 LAB — CBC
Hematocrit: 45.2 % (ref 34.0–46.6)
Hemoglobin: 15.2 g/dL (ref 11.1–15.9)
MCH: 33 pg (ref 26.6–33.0)
MCHC: 33.6 g/dL (ref 31.5–35.7)
MCV: 98 fL — ABNORMAL HIGH (ref 79–97)
Platelets: 207 10*3/uL (ref 150–450)
RBC: 4.6 x10E6/uL (ref 3.77–5.28)
RDW: 12.5 % (ref 11.7–15.4)
WBC: 6.1 10*3/uL (ref 3.4–10.8)

## 2019-03-02 LAB — TSH: TSH: 1.07 u[IU]/mL (ref 0.450–4.500)

## 2019-03-02 LAB — HEMOGLOBIN A1C
Est. average glucose Bld gHb Est-mCnc: 131 mg/dL
Hgb A1c MFr Bld: 6.2 % — ABNORMAL HIGH (ref 4.8–5.6)

## 2019-03-02 NOTE — Telephone Encounter (Signed)
Temazepam refill 

## 2019-03-06 NOTE — Progress Notes (Signed)
Subjective:     Patient ID: Rachel [name] , female    DOB: 1950-07-22 , 68 y.o.   MRN: [id number]   Chief Complaint  Patient presents with  . Annual Exam  . Hypertension    HPI  She is here today for a full physical examination. She is followed by Gyn for her pelvic exams. She denies having any specific concerns or complaints at this time.   Hypertension This is a chronic problem. The current episode started more than 1 year ago. The problem has been gradually improving since onset. The problem is controlled. Pertinent negatives include no blurred vision, chest pain, palpitations or shortness of breath. Risk factors for coronary artery disease include obesity, post-menopausal state and dyslipidemia. Past treatments include diuretics and angiotensin blockers. The current treatment provides moderate improvement. Compliance problems include exercise.  Hypertensive end-organ damage includes kidney disease.     Past Medical History:  Diagnosis Date  . Brain aneurysm 2005  . GERD (gastroesophageal reflux disease)   . Hypertension   . Hypothyroidism   . Osteoporosis   . Thyroid disease      Family History  Problem Relation Age of Onset  . Cancer Mother        Breast   . Hypertension Mother   . Cancer Father        Prostate  . Hypertension Father   . Cancer Sister        Breast     Current Outpatient Medications:  .  atorvastatin (LIPITOR) 20 MG tablet, TAKE 1 TABLET BY MOUTH EVERY NIGHT, Disp: 90 tablet, Rfl: 2 .  calcium carbonate (TUMS - DOSED IN MG ELEMENTAL CALCIUM) 500 MG chewable tablet, Chew 4 tablets by mouth 2 (two) times daily as needed for indigestion or heartburn., Disp: , Rfl:  .  Cholecalciferol (VITAMIN D3) 5000 units CAPS, Take 5,000 Units by mouth daily. , Disp: , Rfl:  .  COMBIGAN 0.2-0.5 % ophthalmic solution, Place 1 drop into the right eye 2 (two) times daily., Disp: , Rfl: 6 .  denosumab (PROLIA) 60 MG/ML SOSY injection, Inject 60 mg into the skin  every 6 (six) months., Disp: 1 Syringe, Rfl: 1 .  levothyroxine (SYNTHROID) 88 MCG tablet, Take 1 tablet (88 mcg total) by mouth daily before breakfast. 1 MON - FRI AND 1/2 TAB SAT-SUN, Disp: 90 tablet, Rfl: 0 .  pantoprazole (PROTONIX) 40 MG tablet, TAKE 1 TABLET BY MOUTH ONCE A DAY, Disp: 90 tablet, Rfl: 2 .  solifenacin (VESICARE) 5 MG tablet, Take 5 mg by mouth daily., Disp: , Rfl:  .  valsartan-hydrochlorothiazide (DIOVAN-HCT) 80-12.5 MG tablet, TAKE 1 TABLET BY MOUTH EVERY DAY, Disp: 90 tablet, Rfl: 1 .  temazepam (RESTORIL) 15 MG capsule, TAKE 1 CAPSULE BY MOUTH EVERY DAY AT BEDTIME AS NEEDED, Disp: 30 capsule, Rfl: 1   Allergies  Allergen Reactions  . Dilantin [Phenytoin Sodium Extended] Rash     The patient states she uses post menopausal status for birth control. Last LMP was No LMP recorded. Patient is postmenopausal.. Negative for Dysmenorrhea Negative for: breast discharge, breast lump(s), breast pain and breast self exam. Associated symptoms include abnormal vaginal bleeding. Pertinent negatives include abnormal bleeding (hematology), anxiety, decreased libido, depression, difficulty falling sleep, dyspareunia, history of infertility, nocturia, sexual dysfunction, sleep disturbances, urinary incontinence, urinary urgency, vaginal discharge and vaginal itching. Diet regular.The patient states her exercise level is  intermittent, three days per week.   . The patient's tobacco use is:  Social History  Tobacco Use  Smoking Status Former Smoker  . Packs/day: 1.00  . Years: 25.00  . Pack years: 25.00  . Types: Cigarettes  . Quit date: 04/02/2004  . Years since quitting: 14.9  Smokeless Tobacco Never Used  . She has been exposed to passive smoke. The patient's alcohol use is:  Social History   Substance and Sexual Activity  Alcohol Use No    Review of Systems  Constitutional: Negative.   HENT: Negative.   Eyes: Negative.  Negative for blurred vision.  Respiratory: Negative.   Negative for shortness of breath.   Cardiovascular: Negative.  Negative for chest pain and palpitations.  Gastrointestinal: Negative.   Endocrine: Negative.   Genitourinary: Negative.   Musculoskeletal: Negative.        She c/o intermittent left thigh pain. Described as burning sensation on lateral thigh area. She is not sure what triggers her sx. Has not had similar sx in the past.   Skin: Negative.   Allergic/Immunologic: Negative.   Neurological: Negative.   Hematological: Negative.   Psychiatric/Behavioral: Negative.      Today's Vitals   03/01/19 1124  BP: 112/74  Pulse: 77  Temp: 97.9 F (36.6 C)  TempSrc: Oral  Weight: 202 lb 12.8 oz (92 kg)  Height: 5' 2.2" (1.58 m)  PainSc: 0-No pain   Body mass index is 36.85 kg/m.   Objective:  Physical Exam Vitals signs and nursing note reviewed.  Constitutional:      Appearance: Normal appearance. She is obese.  HENT:     Head: Normocephalic and atraumatic.     Right Ear: Tympanic membrane, ear canal and external ear normal.     Left Ear: Tympanic membrane, ear canal and external ear normal.     Nose:     Comments: Deferred, masked    Mouth/Throat:     Comments: Deferred, masked Eyes:     Extraocular Movements: Extraocular movements intact.     Conjunctiva/sclera: Conjunctivae normal.     Pupils: Pupils are equal, round, and reactive to light.  Neck:     Musculoskeletal: Normal range of motion and neck supple.  Cardiovascular:     Rate and Rhythm: Normal rate and regular rhythm.     Pulses: Normal pulses.     Heart sounds: Normal heart sounds.  Pulmonary:     Effort: Pulmonary effort is normal.     Breath sounds: Normal breath sounds.  Chest:     Breasts: Tanner Score is 5.        Right: Normal.        Left: Normal.  Abdominal:     General: Abdomen is flat. Bowel sounds are normal.     Palpations: Abdomen is soft.  Genitourinary:    Comments: deferred Musculoskeletal: Normal range of motion.  Skin:     General: Skin is warm and dry.  Neurological:     General: No focal deficit present.     Mental Status: She is alert and oriented to person, place, and time.  Psychiatric:        Mood and Affect: Mood normal.        Behavior: Behavior normal.         Assessment And Plan:     1. Routine general medical examination at health care facility  A full exam was performed. Importance of monthly self breast exams was discussed with the patient. PATIENT HAS BEEN ADVISED TO GET 30-45 MINUTES REGULAR EXERCISE NO LESS THAN FOUR TO FIVE DAYS PER WEEK -  BOTH WEIGHTBEARING EXERCISES AND AEROBIC ARE RECOMMENDED.  SHE WAS ADVISED TO FOLLOW A HEALTHY DIET WITH AT LEAST SIX FRUITS/VEGGIES PER DAY, DECREASE INTAKE OF RED MEAT, AND TO INCREASE FISH INTAKE TO TWO DAYS PER WEEK.  MEATS/FISH SHOULD NOT BE FRIED, BAKED OR BROILED IS PREFERABLE.  I SUGGEST WEARING SPF 50 SUNSCREEN ON EXPOSED PARTS AND ESPECIALLY WHEN IN THE DIRECT SUNLIGHT FOR AN EXTENDED PERIOD OF TIME.  PLEASE AVOID FAST FOOD RESTAURANTS AND INCREASE YOUR WATER INTAKE.   2. Hypertensive nephropathy  Chronic, well controlled. She will continue with current meds. She is encouraged to avoid adding salt to her foods. EKG performed, no acute changes noted. She will rto in six months for re-evaluation.   - EKG 12-Lead - CMP14+EGFR - CBC - Lipid panel - POCT Urinalysis Dipstick (81002) - POCT UA - Microalbumin  3. Chronic renal disease, stage II  I DISCUSSED THE VARIOUS STAGES OF CHRONIC KIDNEY DISEASE AND THEIR CURRENT STAGE.  ALSO DISCUSSED IMPORTANCE OF BP, BS CONTROL AND ADEQUATE HYDRATION.  4. Primary hypothyroidism  I will check thyroid panel and adjust meds as needed.  - TSH - T4, Free  5. Left thigh pain  Her sx are suggestive of meralgia paresthetica. She was advised to purchase OTC lidocaine patches and to apply to affected area, follow packaging instructions.   6. Osteopenia of left hip  Chronic. She will continue with Prolia  every six months to maintain bone density.  Importance of calcium/vitamin D supplementation was discussed with the patient. She will rto in six months for her next injection.   - denosumab (PROLIA) injection 60 mg  7. Other abnormal glucose  HER A1C HAS BEEN ELEVATED IN THE PAST. I WILL CHECK AN A1C, BMET TODAY. SHE WAS ENCOURAGED TO AVOID SUGARY BEVERAGES AND PROCESSED FOODS INCLUDNG BREADS, RICE AND PASTA.  - Hemoglobin A1c  8. Class 2 severe obesity due to excess calories with serious comorbidity and body mass index (BMI) of 36.0 to 36.9 in adult [facility])  Importance of achieving optimal weight to decrease risk of cardiovascular disease and cancers was discussed with the patient in full detail. She is encouraged to start slowly - start with 10 minutes twice daily at least three to four days per week and to gradually build to 30 minutes five days weekly. She was given tips to incorporate more activity into her daily routine - take stairs when possible, park farther away from grocery stores, etc. She is also encouraged to remove refined carbs - white breads, rice and pasta from her diet.    9. History of osteoporosis  This has improved with use of Prolia every six months. Recent bone density is significant for osteopenia.   Maximino Greenland, MD    THE PATIENT IS ENCOURAGED TO PRACTICE SOCIAL DISTANCING DUE TO THE COVID-19 PANDEMIC.

## 2019-03-09 ENCOUNTER — Encounter: Payer: Self-pay | Admitting: Internal Medicine

## 2019-03-23 IMAGING — DX DG CHEST 2V
2 series · 2 of 2 positions shown · non-contrast
Comparison: Chest x-ray of November 21, 2015.

CLINICAL DATA: Chronic dyspnea. History of hypertension. Former
smoker. History breast malignancy

EXAM:
CHEST  2 VIEW

[chest pa]
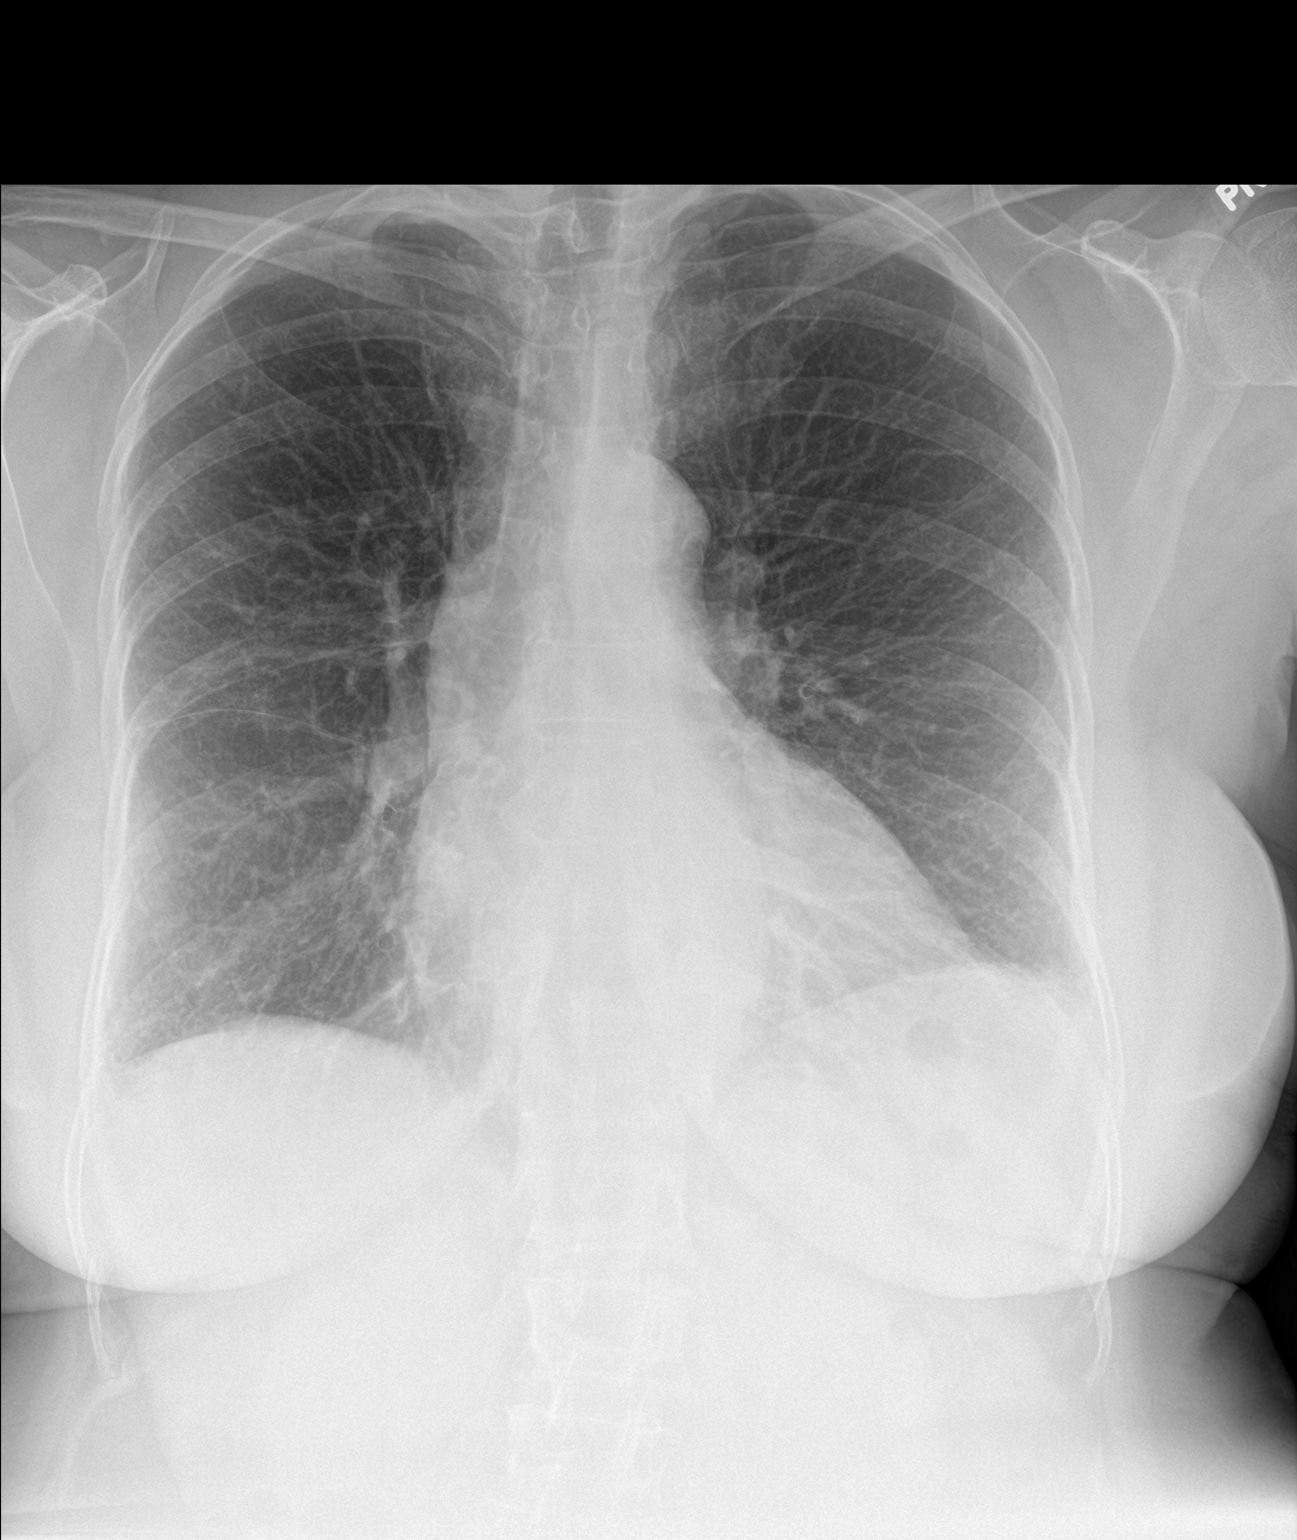

[chest lat]
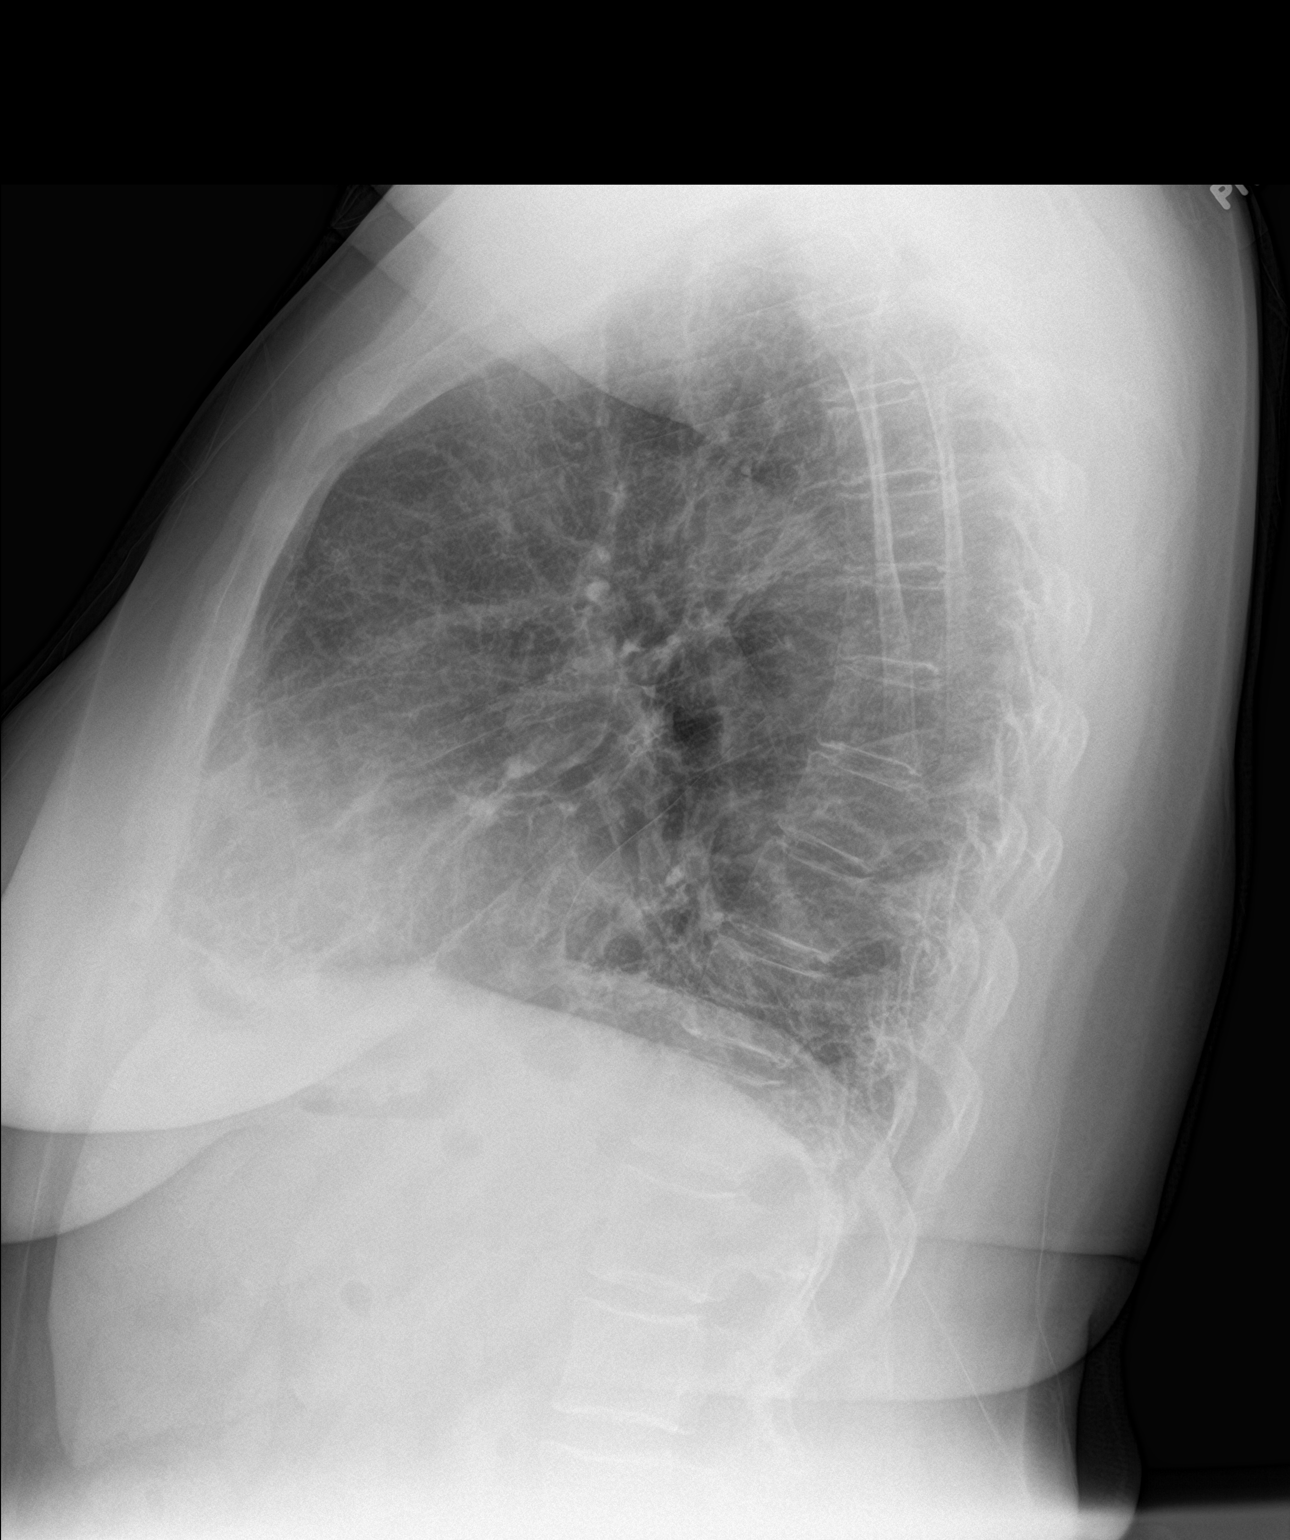

[2 of 2 positions shown; findings below may reference images not displayed]

FINDINGS: The lungs are will well-expanded. The interstitial markings are
coarse especially at the left lung base. This is chronic. The heart
and pulmonary vascularity are normal. There is calcification in the
wall of the aortic arch. There is tortuosity of the descending
thoracic aorta. The bony thorax exhibits no acute abnormality.
IMPRESSION: Chronic bronchitic changes, stable. Stable density at the left lung
base likely reflects scarring. No CHF or pneumonia.

Thoracic aortic atherosclerosis.

## 2019-04-13 ENCOUNTER — Other Ambulatory Visit: Payer: Self-pay | Admitting: Internal Medicine

## 2019-05-01 ENCOUNTER — Other Ambulatory Visit: Payer: Self-pay | Admitting: Internal Medicine

## 2019-05-27 ENCOUNTER — Ambulatory Visit: Payer: Private Health Insurance - Indemnity

## 2019-06-01 ENCOUNTER — Ambulatory Visit: Payer: Private Health Insurance - Indemnity

## 2019-06-03 ENCOUNTER — Ambulatory Visit: Payer: Medicare HMO | Attending: Internal Medicine

## 2019-06-03 DIAGNOSIS — Z23 Encounter for immunization: Secondary | ICD-10-CM

## 2019-06-03 NOTE — Progress Notes (Signed)
   Covid-19 Vaccination Clinic  Name:  Rachel [name]    MRN: [id number] DOB: [birth year]  06/03/2019  [name] was observed post Covid-19 immunization for 15 minutes without incidence. She was provided with Vaccine Information Sheet and instruction to access the V-Safe system.   [name] was instructed to call 911 with any severe reactions post vaccine: Marland Kitchen Difficulty breathing  . Swelling of your face and throat  . A fast heartbeat  . A bad rash all over your body  . Dizziness and weakness    Immunizations Administered    Name Date Dose VIS Date Route   Pfizer COVID-19 Vaccine 06/03/2019  6:01 PM 0.3 mL 04/07/2019 Intramuscular   Manufacturer: [place]   Lot: [id number]   [place]: [id number]

## 2019-06-04 ENCOUNTER — Other Ambulatory Visit: Payer: Self-pay | Admitting: Internal Medicine

## 2019-06-05 ENCOUNTER — Other Ambulatory Visit: Payer: Self-pay

## 2019-06-05 MED ORDER — SIMVASTATIN 40 MG PO TABS: 40.0000 mg | ORAL_TABLET | Freq: Every day | ORAL | 2 refills | Status: DC

## 2019-06-06 ENCOUNTER — Other Ambulatory Visit: Payer: Self-pay | Admitting: Internal Medicine

## 2019-06-06 DIAGNOSIS — I10 Essential (primary) hypertension: Secondary | ICD-10-CM

## 2019-06-22 ENCOUNTER — Telehealth: Payer: Self-pay

## 2019-06-22 NOTE — Telephone Encounter (Signed)
Left vm for pt to return call.   Pt should stop famotidine. She needs to make sure to inform us if her symptoms change/stop. Trying to reduce #of medications she is taking

## 2019-06-28 ENCOUNTER — Ambulatory Visit: Payer: Medicare HMO | Attending: Internal Medicine

## 2019-06-28 DIAGNOSIS — Z23 Encounter for immunization: Secondary | ICD-10-CM | POA: Insufficient documentation

## 2019-06-28 NOTE — Progress Notes (Signed)
   Covid-19 Vaccination Clinic  Name:  Rachel [name]    MRN: [id number] DOB: 07-Feb-1951  06/28/2019  [name] was observed post Covid-19 immunization for 15 minutes without incident. She was provided with Vaccine Information Sheet and instruction to access the V-Safe system.   [name] was instructed to call 911 with any severe reactions post vaccine: Marland Kitchen Difficulty breathing  . Swelling of face and throat  . A fast heartbeat  . A bad rash all over body  . Dizziness and weakness   Immunizations Administered    Name Date Dose VIS Date Route   Pfizer COVID-19 Vaccine 06/28/2019  3:34 PM 0.3 mL 04/07/2019 Intramuscular   Manufacturer: [place]   Lot: [id number]   [place]: [id number]

## 2019-08-15 ENCOUNTER — Telehealth: Payer: Self-pay

## 2019-08-15 NOTE — Telephone Encounter (Signed)
Left vm for pt to return call. Need to know if pt is willing to skip 2 days (sat and Sunday) long tern use to protonix causes thinning of bones.

## 2019-08-16 ENCOUNTER — Telehealth: Payer: Self-pay

## 2019-08-16 NOTE — Telephone Encounter (Signed)
Left vm for pt to return call. Need to know if pt is willing to skip 2 days (sat and Sunday) long tern use to protonix causes thinning of bones.

## 2019-08-17 ENCOUNTER — Other Ambulatory Visit: Payer: Self-pay | Admitting: Internal Medicine

## 2019-08-17 ENCOUNTER — Telehealth: Payer: Self-pay

## 2019-08-17 NOTE — Telephone Encounter (Signed)
Pt advised to skip Saturday and Sunday doses of protonix due to long term use causing bone thinning.

## 2019-08-29 ENCOUNTER — Ambulatory Visit (INDEPENDENT_AMBULATORY_CARE_PROVIDER_SITE_OTHER): Payer: Medicare HMO | Admitting: Internal Medicine

## 2019-08-29 ENCOUNTER — Other Ambulatory Visit: Payer: Self-pay

## 2019-08-29 ENCOUNTER — Encounter: Payer: Self-pay | Admitting: Internal Medicine

## 2019-08-29 VITALS — BP 112/76 | HR 77 | Temp 97.7°F | Ht 62.2 in | Wt 205.2 lb

## 2019-08-29 DIAGNOSIS — N182 Chronic kidney disease, stage 2 (mild): Secondary | ICD-10-CM | POA: Diagnosis not present

## 2019-08-29 DIAGNOSIS — F5101 Primary insomnia: Secondary | ICD-10-CM

## 2019-08-29 DIAGNOSIS — J8410 Pulmonary fibrosis, unspecified: Secondary | ICD-10-CM

## 2019-08-29 DIAGNOSIS — Z6837 Body mass index (BMI) 37.0-37.9, adult: Secondary | ICD-10-CM

## 2019-08-29 DIAGNOSIS — E039 Hypothyroidism, unspecified: Secondary | ICD-10-CM

## 2019-08-29 DIAGNOSIS — R7309 Other abnormal glucose: Secondary | ICD-10-CM | POA: Diagnosis not present

## 2019-08-29 DIAGNOSIS — Z79899 Other long term (current) drug therapy: Secondary | ICD-10-CM

## 2019-08-29 DIAGNOSIS — I129 Hypertensive chronic kidney disease with stage 1 through stage 4 chronic kidney disease, or unspecified chronic kidney disease: Secondary | ICD-10-CM

## 2019-08-29 MED ORDER — TEMAZEPAM 15 MG PO CAPS: ORAL_CAPSULE | ORAL | 1 refills | Status: DC

## 2019-08-29 NOTE — Patient Instructions (Signed)
Please take Pantoprazole Monday Wednesday Friday [place] for Gastroesophageal Reflux Disease, Adult When you have gastroesophageal reflux disease (GERD), the foods you eat and your eating habits are very important. Choosing the right foods can help ease your discomfort. Think about working with a nutrition specialist (dietitian) to help you make good choices. What are tips for following this plan?  Meals  Choose healthy foods that are low in fat, such as fruits, vegetables, whole grains, low-fat dairy products, and lean meat, fish, and poultry.  Eat small meals often instead of 3 large meals a day. Eat your meals slowly, and in a place where you are relaxed. Avoid bending over or lying down until 2-3 hours after eating.  Avoid eating meals 2-3 hours before bed.  Avoid drinking a lot of liquid with meals.  Cook foods using methods other than frying. Bake, grill, or broil food instead.  Avoid or limit: ? Chocolate. ? Peppermint or spearmint. ? Alcohol. ? Pepper. ? Black and decaffeinated coffee. ? Black and decaffeinated tea. ? Bubbly (carbonated) soft drinks. ? Caffeinated energy drinks and soft drinks.  Limit high-fat foods such as: ? Fatty meat or fried foods. ? Whole milk, cream, butter, or ice cream. ? Nuts and nut butters. ? Pastries, donuts, and sweets made with butter or shortening.  Avoid foods that cause symptoms. These foods may be different for everyone. Common foods that cause symptoms include: ? Tomatoes. ? Oranges, lemons, and limes. ? Peppers. ? Spicy food. ? Onions and garlic. ? Vinegar. Lifestyle  Maintain a healthy weight. Ask your doctor what weight is healthy for you. If you need to lose weight, work with your doctor to do so safely.  Exercise for at least 30 minutes for 5 or more days each week, or as told by your doctor.  Wear loose-fitting clothes.  Do not smoke. If you need help quitting, ask your doctor.  Sleep with the head of  your bed higher than your feet. Use a wedge under the mattress or blocks under the bed frame to raise the head of the bed. Summary  When you have gastroesophageal reflux disease (GERD), food and lifestyle choices are very important in easing your symptoms.  Eat small meals often instead of 3 large meals a day. Eat your meals slowly, and in a place where you are relaxed.  Limit high-fat foods such as fatty meat or fried foods.  Avoid bending over or lying down until 2-3 hours after eating.  Avoid peppermint and spearmint, caffeine, alcohol, and chocolate. This information is not intended to replace advice given to you by your health care provider. Make sure you discuss any questions you have with your health care provider. Document Revised: 08/04/2018 Document Reviewed: 05/19/2016 Elsevier Patient Education  [place].

## 2019-08-30 LAB — CMP14+EGFR
ALT: 11 IU/L (ref 0–32)
AST: 22 IU/L (ref 0–40)
Albumin/Globulin Ratio: 1.3 (ref 1.2–2.2)
Albumin: 4.3 g/dL (ref 3.8–4.8)
Alkaline Phosphatase: 57 IU/L (ref 39–117)
BUN/Creatinine Ratio: 16 (ref 12–28)
BUN: 23 mg/dL (ref 8–27)
Bilirubin Total: 0.8 mg/dL (ref 0.0–1.2)
CO2: 22 mmol/L (ref 20–29)
Calcium: 10.3 mg/dL (ref 8.7–10.3)
Chloride: 105 mmol/L (ref 96–106)
Creatinine, Ser: 1.43 mg/dL — ABNORMAL HIGH (ref 0.57–1.00)
GFR calc Af Amer: 44 mL/min/{1.73_m2} — ABNORMAL LOW (ref >59)
GFR calc non Af Amer: 38 mL/min/{1.73_m2} — ABNORMAL LOW (ref >59)
Globulin, Total: 3.3 g/dL (ref 1.5–4.5)
Glucose: 95 mg/dL (ref 65–99)
Potassium: 4.6 mmol/L (ref 3.5–5.2)
Sodium: 144 mmol/L (ref 134–144)
Total Protein: 7.6 g/dL (ref 6.0–8.5)

## 2019-08-30 LAB — HEMOGLOBIN A1C
Est. average glucose Bld gHb Est-mCnc: 123 mg/dL
Hgb A1c MFr Bld: 5.9 % — ABNORMAL HIGH (ref 4.8–5.6)

## 2019-08-30 LAB — TSH: TSH: 2.74 u[IU]/mL (ref 0.450–4.500)

## 2019-08-30 LAB — T4, FREE: Free T4: 1.43 ng/dL (ref 0.82–1.77)

## 2019-08-30 LAB — VITAMIN B12: Vitamin B-12: 709 pg/mL (ref 232–1245)

## 2019-09-01 ENCOUNTER — Telehealth: Payer: Self-pay

## 2019-09-01 NOTE — Telephone Encounter (Signed)
-----   Message from Glendale Chard, MD sent at 08/31/2019 10:37 PM EDT ----- Here are your lab results:  Your kidney function has decreased. Have you been drinking water?   Your thyroid function is normal. Your hba1c is 5.9, down from 6.2.   Your vitamin B12 level is normal.   Please let me know if you have any questions or concerns. Stay safe!   Sincerely,    Robyn N. Baird [facility], MD

## 2019-09-02 NOTE — Progress Notes (Signed)
This visit occurred during the SARS-CoV-2 public health emergency.  Safety protocols were in place, including screening questions prior to the visit, additional usage of staff PPE, and extensive cleaning of exam room while observing appropriate contact time as indicated for disinfecting solutions.  Subjective:     Patient ID: Rachel [name] , female    DOB: [birth year] , 69 y.o.   MRN: [id number]   Chief Complaint  Patient presents with  . Hypertension    HPI  Hypertension This is a chronic problem. The current episode started more than 1 year ago. The problem has been gradually improving since onset. The problem is controlled. Pertinent negatives include no blurred vision, chest pain, palpitations or shortness of breath. Risk factors for coronary artery disease include dyslipidemia, obesity, post-menopausal state and sedentary lifestyle. The current treatment provides moderate improvement. Identifiable causes of hypertension include a thyroid problem.  Thyroid Problem Presents for follow-up visit. Patient reports no cold intolerance, constipation, palpitations or visual change. The symptoms have been stable.     Past Medical History:  Diagnosis Date  . Brain aneurysm 2005  . GERD (gastroesophageal reflux disease)   . Hypertension   . Hypothyroidism   . Osteoporosis   . Thyroid disease      Family History  Problem Relation Age of Onset  . Cancer Mother        Breast   . Hypertension Mother   . Cancer Father        Prostate  . Hypertension Father   . Cancer Sister        Breast     Current Outpatient Medications:  .  calcium carbonate (TUMS - DOSED IN MG ELEMENTAL CALCIUM) 500 MG chewable tablet, Chew 4 tablets by mouth 2 (two) times daily as needed for indigestion or heartburn., Disp: , Rfl:  .  Cholecalciferol (VITAMIN D3) 5000 units CAPS, Take 5,000 Units by mouth daily. , Disp: , Rfl:  .  COMBIGAN 0.2-0.5 % ophthalmic solution, Place 1 drop into the right eye 2 (two)  times daily., Disp: , Rfl: 6 .  denosumab (PROLIA) 60 MG/ML SOSY injection, Inject 60 mg into the skin every 6 (six) months., Disp: 1 Syringe, Rfl: 1 .  pantoprazole (PROTONIX) 40 MG tablet, TAKE 1 TABLET BY MOUTH ONCE A DAY, Disp: 90 tablet, Rfl: 2 .  simvastatin (ZOCOR) 40 MG tablet, Take 1 tablet (40 mg total) by mouth daily., Disp: 90 tablet, Rfl: 2 .  SYNTHROID 88 MCG tablet, TAKE 1 TABLET BY MOUTH DAILY BEFORE BREAKFAST. 1 TABLET ON MON - FRI AND 1/2 TAB SAT-SUN, Disp: 90 tablet, Rfl: 0 .  temazepam (RESTORIL) 15 MG capsule, TAKE 1 CAPSULE BY MOUTH EVERY DAY AT BEDTIME AS NEEDED, Disp: 30 capsule, Rfl: 1 .  valsartan-hydrochlorothiazide (DIOVAN-HCT) 80-12.5 MG tablet, TAKE 1 TABLET BY MOUTH EVERY DAY, Disp: 90 tablet, Rfl: 1 .  solifenacin (VESICARE) 5 MG tablet, Take 5 mg by mouth daily., Disp: , Rfl:    Allergies  Allergen Reactions  . Dilantin [Phenytoin Sodium Extended] Rash     Review of Systems  Constitutional: Negative.   Eyes: Negative for blurred vision.  Respiratory: Negative.  Negative for shortness of breath.   Cardiovascular: Negative.  Negative for chest pain and palpitations.  Gastrointestinal: Negative.  Negative for constipation.  Endocrine: Negative for cold intolerance.  Neurological: Negative.   Psychiatric/Behavioral: Negative.      Today's Vitals   08/29/19 1424  BP: 112/76  Pulse: 77  Temp: 97.7 F (36.5  C)  TempSrc: Oral  Weight: 205 lb 3.2 oz (93.1 kg)  Height: 5' 2.2" (1.58 m)   Body mass index is 37.29 kg/m.   Objective:  Physical Exam Vitals and nursing note reviewed.  Constitutional:      Appearance: Normal appearance. She is obese.  HENT:     Head: Normocephalic and atraumatic.  Cardiovascular:     Rate and Rhythm: Normal rate and regular rhythm.     Heart sounds: Normal heart sounds.  Pulmonary:     Effort: Pulmonary effort is normal.     Breath sounds: Normal breath sounds.  Skin:    General: Skin is warm.  Neurological:      General: No focal deficit present.     Mental Status: She is alert.  Psychiatric:        Mood and Affect: Mood normal.        Behavior: Behavior normal.         Assessment And Plan:     1. Hypertensive nephropathy  Chronic, well controlled. She will continue with current meds. She is encouraged to avoid adding salt to her foods. I will check renal function.   - CMP14+EGFR  2. Chronic renal disease, stage II  Chronic, she is encouraged to stay well hydrated.   3. Primary hypothyroidism  I will check thyroid panel and adjust meds as needed.  - TSH - T4, free  4. Other abnormal glucose  HER A1C HAS BEEN ELEVATED IN THE PAST. I WILL CHECK AN A1C, BMET TODAY. SHE WAS ENCOURAGED TO AVOID SUGARY BEVERAGES AND PROCESSED FOODS INCLUDNG BREADS, RICE AND PASTA.  - Hemoglobin A1c  5. Postinflammatory pulmonary fibrosis (HCC)  Chronic, as per Pulmonary.   6. Primary insomnia  Chronic. She was given rx temazepam, 52m po qhs prn. She is encouraged to develop a bedtime routine.   7. Drug therapy  - Vitamin B12  8. Class 2 severe obesity due to excess calories with serious comorbidity and body mass index (BMI) of 37.0 to 37.9 in adult ([facility]  She is encouraged to increase her daily activity and to aim for at least 30 minutes five days per week. She is also encouraged to avoid sugary beverages.   RMaximino Greenland MD    THE PATIENT IS ENCOURAGED TO PRACTICE SOCIAL DISTANCING DUE TO THE COVID-19 PANDEMIC.

## 2019-09-10 ENCOUNTER — Other Ambulatory Visit: Payer: Self-pay | Admitting: Internal Medicine

## 2019-09-10 DIAGNOSIS — I10 Essential (primary) hypertension: Secondary | ICD-10-CM

## 2019-11-06 LAB — HM MAMMOGRAPHY: HM Mammogram: ABNORMAL — AB (ref 0–4)

## 2019-11-09 ENCOUNTER — Encounter: Payer: Self-pay | Admitting: Internal Medicine

## 2019-11-21 LAB — HM MAMMOGRAPHY: HM Mammogram: ABNORMAL — AB (ref 0–4)

## 2019-11-23 ENCOUNTER — Encounter: Payer: Self-pay | Admitting: Internal Medicine

## 2019-11-27 ENCOUNTER — Ambulatory Visit (INDEPENDENT_AMBULATORY_CARE_PROVIDER_SITE_OTHER): Payer: Medicare HMO | Admitting: Podiatry

## 2019-11-27 ENCOUNTER — Encounter: Payer: Self-pay | Admitting: Podiatry

## 2019-11-27 ENCOUNTER — Other Ambulatory Visit: Payer: Self-pay

## 2019-11-27 DIAGNOSIS — M79674 Pain in right toe(s): Secondary | ICD-10-CM

## 2019-11-27 DIAGNOSIS — Z79899 Other long term (current) drug therapy: Secondary | ICD-10-CM

## 2019-11-27 DIAGNOSIS — B351 Tinea unguium: Secondary | ICD-10-CM

## 2019-11-27 DIAGNOSIS — M79675 Pain in left toe(s): Secondary | ICD-10-CM | POA: Diagnosis not present

## 2019-11-27 LAB — HEPATIC FUNCTION PANEL
AG Ratio: 1.1 (calc) (ref 1.0–2.5)
ALT: 9 U/L (ref 6–29)
AST: 16 U/L (ref 10–35)
Albumin: 4.1 g/dL (ref 3.6–5.1)
Alkaline phosphatase (APISO): 110 U/L (ref 37–153)
Bilirubin, Direct: 0.2 mg/dL (ref 0.0–0.2)
Globulin: 3.6 g/dL (calc) (ref 1.9–3.7)
Indirect Bilirubin: 0.8 mg/dL (calc) (ref 0.2–1.2)
Total Bilirubin: 1 mg/dL (ref 0.2–1.2)
Total Protein: 7.7 g/dL (ref 6.1–8.1)

## 2019-11-27 MED ORDER — TERBINAFINE HCL 250 MG PO TABS: 250.0000 mg | ORAL_TABLET | Freq: Every day | ORAL | 0 refills | Status: DC

## 2019-11-27 NOTE — Progress Notes (Signed)
   HPI: 69 y.o. female presenting today as a new patient for evaluation of thickening discolored nails to the bilateral great toes.  Patient states that she had a pedicure performed in February 2020 and over the past year she has developed a curved nail that has become thickened and discolored.  She is concerned for possible toenail fungus.  She has not done anything for treatment.  she presents for further treatment evaluation  Past Medical History:  Diagnosis Date  . Brain aneurysm 2005  . GERD (gastroesophageal reflux disease)   . Hypertension   . Hypothyroidism   . Osteoporosis   . Thyroid disease      Physical Exam: General: The patient is alert and oriented x3 in no acute distress.  Dermatology: Skin is warm, dry and supple bilateral lower extremities. Negative for open lesions or macerations.  Hyperkeratotic, dystrophic, discolored nails noted to the right foot, most significantly the right hallux.  Findings consistent with onychomycosis  Vascular: Palpable pedal pulses bilaterally. No edema or erythema noted. Capillary refill within normal limits.  Neurological: Epicritic and protective threshold grossly intact bilaterally.   Musculoskeletal Exam: Range of motion within normal limits to all pedal and ankle joints bilateral. Muscle strength 5/5 in all groups bilateral.   Assessment: 1.  Onychomycosis of toenails right foot   Plan of Care:  1. Patient evaluated. 2.  Discussed different treatment options including topical, antifungal laser, and oral treatment modalities and effectiveness.  Patient opted for oral treatment management 3.  Prescription for Lamisil 250 mg #90 daily 4.  Order placed for hepatic function panel.  If abnormal she will discontinue the oral Lamisil 5.  Return to clinic in 6 months  *Referred by Somalia Hill. She is the grandmother to Asia's twin boys.      Edrick Kins, DPM [facility]  Dr. Edrick Kins, DPM    [address], [place] [address]                Office [phone]  Fax [phone]

## 2019-12-13 ENCOUNTER — Other Ambulatory Visit: Payer: Self-pay | Admitting: Radiology

## 2019-12-20 ENCOUNTER — Encounter: Payer: Self-pay | Admitting: Internal Medicine

## 2020-01-09 LAB — HM PAP SMEAR: HM Pap smear: POSITIVE

## 2020-01-25 ENCOUNTER — Other Ambulatory Visit (HOSPITAL_BASED_OUTPATIENT_CLINIC_OR_DEPARTMENT_OTHER): Payer: Self-pay | Admitting: General Surgery

## 2020-01-25 DIAGNOSIS — R928 Other abnormal and inconclusive findings on diagnostic imaging of breast: Secondary | ICD-10-CM

## 2020-01-30 ENCOUNTER — Ambulatory Visit: Payer: Medicare HMO | Attending: Internal Medicine

## 2020-01-30 DIAGNOSIS — Z23 Encounter for immunization: Secondary | ICD-10-CM

## 2020-01-30 NOTE — Progress Notes (Signed)
   Covid-19 Vaccination Clinic  Name:  Rachel [name]    MRN: [id number] DOB: 24-Apr-1951  01/30/2020  [name] was observed post Covid-19 immunization for 15 minutes without incident. She was provided with Vaccine Information Sheet and instruction to access the V-Safe system.   [name] was instructed to call 911 with any severe reactions post vaccine: Marland Kitchen Difficulty breathing  . Swelling of face and throat  . A fast heartbeat  . A bad rash all over body  . Dizziness and weakness

## 2020-02-13 ENCOUNTER — Encounter (HOSPITAL_BASED_OUTPATIENT_CLINIC_OR_DEPARTMENT_OTHER): Payer: Self-pay | Admitting: General Surgery

## 2020-02-13 ENCOUNTER — Other Ambulatory Visit: Payer: Self-pay

## 2020-02-16 ENCOUNTER — Other Ambulatory Visit (HOSPITAL_COMMUNITY)
Admission: RE | Admit: 2020-02-16 | Discharge: 2020-02-16 | Disposition: A | Discharge status: Home / Self Care | Payer: Medicare HMO | Source: Ambulatory Visit | Attending: General Surgery | Admitting: General Surgery

## 2020-02-16 ENCOUNTER — Encounter (HOSPITAL_BASED_OUTPATIENT_CLINIC_OR_DEPARTMENT_OTHER)
Admission: RE | Admit: 2020-02-16 | Discharge: 2020-02-16 | Disposition: A | Discharge status: Home / Self Care | Payer: Medicare HMO | Source: Ambulatory Visit | Attending: General Surgery | Admitting: General Surgery

## 2020-02-16 DIAGNOSIS — Z01812 Encounter for preprocedural laboratory examination: Secondary | ICD-10-CM | POA: Insufficient documentation

## 2020-02-16 DIAGNOSIS — Z20822 Contact with and (suspected) exposure to covid-19: Secondary | ICD-10-CM | POA: Diagnosis not present

## 2020-02-16 LAB — BASIC METABOLIC PANEL
Anion gap: 8 (ref 5–15)
BUN: 19 mg/dL (ref 8–23)
CO2: 25 mmol/L (ref 22–32)
Calcium: 9.9 mg/dL (ref 8.9–10.3)
Chloride: 105 mmol/L (ref 98–111)
Creatinine, Ser: 1.13 mg/dL — ABNORMAL HIGH (ref 0.44–1.00)
GFR, Estimated: 53 mL/min — ABNORMAL LOW (ref >60)
Glucose, Bld: 96 mg/dL (ref 70–99)
Potassium: 4.1 mmol/L (ref 3.5–5.1)
Sodium: 138 mmol/L (ref 135–145)

## 2020-02-16 LAB — SARS CORONAVIRUS 2 (TAT 6-24 HRS): SARS Coronavirus 2: NEGATIVE

## 2020-02-16 NOTE — Progress Notes (Signed)

## 2020-02-19 NOTE — H&P (Signed)
[facility] Appointment: 01/26/2020 9:00 AM Location: [facility] Patient #: [id number] DOB: 08-06-1950 Married / Language: English / Race: Black or African American Female   History of Present Illness Stark Klein MD; 01/26/2020 9:55 AM) The patient is a 69 year old female who presents for a follow-up for Breast problems. Patient is a 69 year old female referred by Dr. Marcelo Baldy 2017 for consultation regarding a new diagnosis of complex sclerosing lesion of the left breast 2017. She has a family history with her mother having breast cancer at age 54 and her sister with breast cancer at age 71. Diagnostic mammogram and ultrasound showed a 4 mm oval mass in the anterior depth in the lower inner quadrant that was hypoechoic and vascular. She underwent seed localized excisional biopsy 09/26/15. Excisional pathology showed CSL.  She is referred back in 2021 for a new abnormal mammogram 2021 with an atypical 3 mm area of calcifications in the UOQ. Core needle biopsy of this also showed CSL. She presents for excisional biopsy. She has no new significant medical problems.   Mammogram at [facility] and reports reviewed. Described above.   pathology 11/2019 Breast, left, needle core biopsy, LUOQ, 9cmfn - COMPLEX SCLEROSING LESION WITH CALCIFICATIONS    Past Surgical History (Chanel Teressa Senter, CMA; 01/26/2020 9:05 AM) Aneurysm Repair  Breast Biopsy  Left. Oral Surgery   Diagnostic Studies History (Chanel Teressa Senter, CMA; 01/26/2020 9:05 AM) Colonoscopy  within last year 1-5 years ago Mammogram  within last year Pap Smear  1-5 years ago  Allergies (Chanel Teressa Senter, CMA; 01/26/2020 9:05 AM) Dilantin *ANTICONVULSANTS*  Phenytoin *ANTICONVULSANTS*  Allergies Reconciled   Medication History (Chanel Teressa Senter, CMA; 01/26/2020 9:05 AM) Atorvastatin Calcium (20MG  Tablet, Oral) Active. HydroCHLOROthiazide (12.5MG  Capsule, Oral) Active. Lisinopril (10MG  Tablet, Oral)  Active. Pantoprazole Sodium (40MG  Tablet DR, Oral) Active. Synthroid (100MCG Tablet, Oral) Active. Calcium (500MG  Tablet, Oral) Active. Medications Reconciled  Pregnancy / Birth History Antonietta Jewel, [place]; 01/26/2020 9:05 AM) Age at menarche  4 years. Age of menopause  [age] Contraceptive History  Oral contraceptives. Gravida  1 Maternal age  79-25 Para  1  Other Problems (Chanel Teressa Senter, CMA; 01/26/2020 9:05 AM) Gastroesophageal Reflux Disease  High blood pressure  Oophorectomy  Bilateral. Thyroid Disease     Review of Systems (Chanel Nolan CMA; 01/26/2020 9:05 AM) General Not Present- Appetite Loss, Chills, Fatigue, Fever, Night Sweats, Weight Gain and Weight Loss. Skin Not Present- Change in Wart/Mole, Dryness, Hives, Jaundice, New Lesions, Non-Healing Wounds, Rash and Ulcer. HEENT Present- Wears glasses/contact lenses. Not Present- Earache, Hearing Loss, Hoarseness, Nose Bleed, Oral Ulcers, Ringing in the Ears, Seasonal Allergies, Sinus Pain, Sore Throat, Visual Disturbances and Yellow Eyes. Respiratory Not Present- Bloody sputum, Chronic Cough, Difficulty Breathing, Snoring and Wheezing. Cardiovascular Not Present- Chest Pain, Difficulty Breathing Lying Down, Leg Cramps, Palpitations, Rapid Heart Rate, Shortness of Breath and Swelling of Extremities. Gastrointestinal Not Present- Abdominal Pain, Bloating, Bloody Stool, Change in Bowel Habits, Chronic diarrhea, Constipation, Difficulty Swallowing, Excessive gas, Gets full quickly at meals, Hemorrhoids, Indigestion, Nausea, Rectal Pain and Vomiting. Female Genitourinary Not Present- Frequency, Nocturia, Painful Urination, Pelvic Pain and Urgency. Musculoskeletal Not Present- Back Pain, Joint Pain, Joint Stiffness, Muscle Pain, Muscle Weakness and Swelling of Extremities. Neurological Not Present- Decreased Memory, Fainting, Headaches, Numbness, Seizures, Tingling, Tremor, Trouble walking and Weakness. Psychiatric Not  Present- Anxiety, Bipolar, Change in Sleep Pattern, Depression, Fearful and Frequent crying. Endocrine Not Present- Cold Intolerance, Excessive Hunger, Hair Changes, Heat Intolerance, Hot flashes and New Diabetes. Hematology Not Present- Blood  Thinners, Easy Bruising, Excessive bleeding, Gland problems, HIV and Persistent Infections.  Vitals (Chanel Nolan CMA; 01/26/2020 9:06 AM) 01/26/2020 9:05 AM Weight: 207.13 lb Height: 64in Body Surface Area: 1.99 m Body Mass Index: 35.55 kg/m  Temp.: 15F  Pulse: 69 (Regular)  BP: 120/72(Sitting, Left Arm, Standard)       Physical Exam Stark Klein MD; 01/26/2020 9:56 AM) General Mental Status-Alert. General Appearance-Consistent with stated age. Hydration-Well hydrated. Voice-Normal.  Head and Neck Head-normocephalic, atraumatic with no lesions or palpable masses.  Eye Sclera/Conjunctiva - Bilateral-No scleral icterus.  Chest and Lung Exam Chest and lung exam reveals -quiet, even and easy respiratory effort with no use of accessory muscles. Inspection Chest Wall - Normal. Back - normal.  Breast Note: relatively symmetric. very faint scar left circumareolar incision inferolaterally. minimal thickening at this area. no palpable masses. no skin dimpling. no LAD. right breast without concerning findings either. no nipple retraction or discharge on either side. Breasts mostly fatty replaced.   Cardiovascular Cardiovascular examination reveals -normal pedal pulses bilaterally. Note: regular rate and rhythm  Abdomen Inspection-Inspection Normal. Palpation/Percussion Palpation and Percussion of the abdomen reveal - Soft, Non Tender, No Rebound tenderness, No Rigidity (guarding) and No hepatosplenomegaly.  Peripheral Vascular Upper Extremity Inspection - Bilateral - Normal - No Clubbing, No Cyanosis, No Edema, Pulses Intact. Lower Extremity Palpation - Edema - Bilateral - No edema -  Bilateral.  Neurologic Neurologic evaluation reveals -alert and oriented x 3 with no impairment of recent or remote memory. Mental Status-Normal.  Musculoskeletal Global Assessment -Note: no gross deformities.  Normal Exam - Left-Upper Extremity Strength Normal and Lower Extremity Strength Normal. Normal Exam - Right-Upper Extremity Strength Normal and Lower Extremity Strength Normal.  Lymphatic Head & Neck  General Head & Neck Lymphatics: Bilateral - Description - Normal. Axillary  General Axillary Region: Bilateral - Description - Normal. Tenderness - Non Tender.    Assessment & Plan Stark Klein MD; 01/26/2020 9:56 AM) ABNORMAL MAMMOGRAM OF LEFT BREAST (R92.8) Impression: Pt has another abnormal area of calcifications that requires excisional biopsy. Will schedule seed localized excisional biopsy.  The surgical procedure was described to the patient. I discussed the incision type and location and that we would need radiology involved on with a wire or seed marker and/or sentinel node.  The risks and benefits of the procedure were described to the patient and she wishes to proceed.  We discussed the risks bleeding, infection, damage to other structures, need for further procedures/surgeries. We discussed the risk of seroma. The patient was advised if the area in the breast in cancer, we may need to go back to surgery for additional tissue to obtain negative margins or for a lymph node biopsy. The patient was advised that these are the most common complications, but that others can occur as well. They were advised against taking aspirin or other anti-inflammatory agents/blood thinners the week before surgery. Current Plans You are being scheduled for surgery- Our schedulers will call you.  You should hear from our office's scheduling department within 5 working days about the location, date, and time of surgery. We try to make accommodations for patient's preferences  in scheduling surgery, but sometimes the OR schedule or the surgeon's schedule prevents [place] from making those accommodations.  If you have not heard from our office [phone]) in 5 working days, call the office and ask for your surgeon's nurse.  If you have other questions about your diagnosis, plan, or surgery, call the office and ask for your surgeon's  nurse.  Pt Education - [facility] Breast Biopsy HCI: discussed with patient and provided information.   Signed electronically by Stark Klein, MD (01/26/2020 9:57 AM

## 2020-02-20 ENCOUNTER — Other Ambulatory Visit: Payer: Self-pay

## 2020-02-20 ENCOUNTER — Ambulatory Visit (HOSPITAL_BASED_OUTPATIENT_CLINIC_OR_DEPARTMENT_OTHER): Payer: Medicare HMO | Admitting: Anesthesiology

## 2020-02-20 ENCOUNTER — Encounter (HOSPITAL_BASED_OUTPATIENT_CLINIC_OR_DEPARTMENT_OTHER): Payer: Self-pay | Admitting: General Surgery

## 2020-02-20 ENCOUNTER — Ambulatory Visit (HOSPITAL_BASED_OUTPATIENT_CLINIC_OR_DEPARTMENT_OTHER)
Admission: RE | Admit: 2020-02-20 | Discharge: 2020-02-20 | Disposition: A | Discharge status: Home / Self Care | Payer: Medicare HMO | Attending: General Surgery | Admitting: General Surgery

## 2020-02-20 ENCOUNTER — Encounter (HOSPITAL_BASED_OUTPATIENT_CLINIC_OR_DEPARTMENT_OTHER)
Admission: RE | Disposition: A | Discharge status: Home / Self Care | Payer: Self-pay | Source: Home / Self Care | Attending: General Surgery

## 2020-02-20 DIAGNOSIS — Z803 Family history of malignant neoplasm of breast: Secondary | ICD-10-CM | POA: Diagnosis not present

## 2020-02-20 DIAGNOSIS — Z888 Allergy status to other drugs, medicaments and biological substances status: Secondary | ICD-10-CM | POA: Insufficient documentation

## 2020-02-20 DIAGNOSIS — D242 Benign neoplasm of left breast: Secondary | ICD-10-CM | POA: Diagnosis not present

## 2020-02-20 DIAGNOSIS — R928 Other abnormal and inconclusive findings on diagnostic imaging of breast: Secondary | ICD-10-CM | POA: Diagnosis not present

## 2020-02-20 DIAGNOSIS — Z90722 Acquired absence of ovaries, bilateral: Secondary | ICD-10-CM | POA: Insufficient documentation

## 2020-02-20 DIAGNOSIS — N6092 Unspecified benign mammary dysplasia of left breast: Secondary | ICD-10-CM | POA: Diagnosis not present

## 2020-02-20 HISTORY — PX: RADIOACTIVE SEED GUIDED EXCISIONAL BREAST BIOPSY: SHX6490

## 2020-02-20 HISTORY — DX: Other abnormal and inconclusive findings on diagnostic imaging of breast: R92.8

## 2020-02-20 SURGERY — RADIOACTIVE SEED GUIDED BREAST BIOPSY
Anesthesia: General | Site: Breast | Laterality: Left

## 2020-02-20 MED ORDER — OXYCODONE HCL 5 MG PO TABS: 5.0000 mg | ORAL_TABLET | Freq: Once | ORAL | Status: DC | PRN

## 2020-02-20 MED ORDER — DIPHENHYDRAMINE HCL 50 MG/ML IJ SOLN
INTRAMUSCULAR | Status: AC
  Filled 2020-02-20: qty 1

## 2020-02-20 MED ORDER — LIDOCAINE-EPINEPHRINE 1 %-1:100000 IJ SOLN
INTRAMUSCULAR | Status: AC
  Filled 2020-02-20: qty 1

## 2020-02-20 MED ORDER — BUPIVACAINE HCL (PF) 0.5 % IJ SOLN
INTRAMUSCULAR | Status: AC
  Filled 2020-02-20: qty 30

## 2020-02-20 MED ORDER — LIDOCAINE 2% (20 MG/ML) 5 ML SYRINGE
INTRAMUSCULAR | Status: AC
  Filled 2020-02-20: qty 5

## 2020-02-20 MED ORDER — OXYCODONE HCL 5 MG/5ML PO SOLN: 5.0000 mg | Freq: Once | ORAL | Status: DC | PRN

## 2020-02-20 MED ORDER — LIDOCAINE-EPINEPHRINE (PF) 1 %-1:200000 IJ SOLN
INTRAMUSCULAR | Status: DC | PRN
  Administered 2020-02-20: 40 mL via INTRAMUSCULAR

## 2020-02-20 MED ORDER — DEXAMETHASONE SODIUM PHOSPHATE 10 MG/ML IJ SOLN
INTRAMUSCULAR | Status: AC
  Filled 2020-02-20: qty 1

## 2020-02-20 MED ORDER — ONDANSETRON HCL 4 MG/2ML IJ SOLN
INTRAMUSCULAR | Status: AC
  Filled 2020-02-20: qty 2

## 2020-02-20 MED ORDER — FENTANYL CITRATE (PF) 100 MCG/2ML IJ SOLN
INTRAMUSCULAR | Status: DC | PRN
  Administered 2020-02-20 (×2): 50 ug via INTRAVENOUS

## 2020-02-20 MED ORDER — ONDANSETRON HCL 4 MG/2ML IJ SOLN: 4.0000 mg | Freq: Once | INTRAMUSCULAR | Status: DC | PRN

## 2020-02-20 MED ORDER — CEFAZOLIN SODIUM-DEXTROSE 2-4 GM/100ML-% IV SOLN
2.0000 g | INTRAVENOUS | Status: AC
  Administered 2020-02-20: 2 g via INTRAVENOUS

## 2020-02-20 MED ORDER — AMISULPRIDE (ANTIEMETIC) 5 MG/2ML IV SOLN: 10.0000 mg | Freq: Once | INTRAVENOUS | Status: DC | PRN

## 2020-02-20 MED ORDER — LACTATED RINGERS IV SOLN: INTRAVENOUS | Status: DC

## 2020-02-20 MED ORDER — ONDANSETRON HCL 4 MG/2ML IJ SOLN
INTRAMUSCULAR | Status: DC | PRN
  Administered 2020-02-20: 4 mg via INTRAVENOUS

## 2020-02-20 MED ORDER — EPHEDRINE 5 MG/ML INJ
INTRAVENOUS | Status: AC
  Filled 2020-02-20: qty 10

## 2020-02-20 MED ORDER — SUCCINYLCHOLINE CHLORIDE 200 MG/10ML IV SOSY
PREFILLED_SYRINGE | INTRAVENOUS | Status: AC
  Filled 2020-02-20: qty 10

## 2020-02-20 MED ORDER — DEXAMETHASONE SODIUM PHOSPHATE 4 MG/ML IJ SOLN
INTRAMUSCULAR | Status: DC | PRN
  Administered 2020-02-20: 10 mg via INTRAVENOUS

## 2020-02-20 MED ORDER — FENTANYL CITRATE (PF) 100 MCG/2ML IJ SOLN: 25.0000 ug | INTRAMUSCULAR | Status: DC | PRN

## 2020-02-20 MED ORDER — PHENYLEPHRINE 40 MCG/ML (10ML) SYRINGE FOR IV PUSH (FOR BLOOD PRESSURE SUPPORT)
PREFILLED_SYRINGE | INTRAVENOUS | Status: AC
  Filled 2020-02-20: qty 10

## 2020-02-20 MED ORDER — LIDOCAINE-EPINEPHRINE (PF) 1 %-1:200000 IJ SOLN
INTRAMUSCULAR | Status: AC
  Filled 2020-02-20: qty 30

## 2020-02-20 MED ORDER — FENTANYL CITRATE (PF) 100 MCG/2ML IJ SOLN
INTRAMUSCULAR | Status: AC
  Filled 2020-02-20: qty 2

## 2020-02-20 MED ORDER — OXYCODONE HCL 5 MG PO TABS: 5.0000 mg | ORAL_TABLET | Freq: Four times a day (QID) | ORAL | 0 refills | Status: DC | PRN

## 2020-02-20 MED ORDER — MIDAZOLAM HCL 5 MG/5ML IJ SOLN
INTRAMUSCULAR | Status: DC | PRN
  Administered 2020-02-20: 2 mg via INTRAVENOUS

## 2020-02-20 MED ORDER — MIDAZOLAM HCL 2 MG/2ML IJ SOLN
INTRAMUSCULAR | Status: AC
  Filled 2020-02-20: qty 2

## 2020-02-20 MED ORDER — CHLORHEXIDINE GLUCONATE CLOTH 2 % EX PADS: 6.0000 | MEDICATED_PAD | Freq: Once | CUTANEOUS | Status: DC

## 2020-02-20 MED ORDER — PROPOFOL 10 MG/ML IV BOLUS
INTRAVENOUS | Status: DC | PRN
  Administered 2020-02-20: 200 mg via INTRAVENOUS

## 2020-02-20 MED ORDER — PROPOFOL 500 MG/50ML IV EMUL
INTRAVENOUS | Status: AC
  Filled 2020-02-20: qty 50

## 2020-02-20 MED ORDER — LIDOCAINE HCL (CARDIAC) PF 100 MG/5ML IV SOSY
PREFILLED_SYRINGE | INTRAVENOUS | Status: DC | PRN
  Administered 2020-02-20: 50 mg via INTRAVENOUS

## 2020-02-20 MED ORDER — CEFAZOLIN SODIUM-DEXTROSE 2-4 GM/100ML-% IV SOLN
INTRAVENOUS | Status: AC
  Filled 2020-02-20: qty 100

## 2020-02-20 MED ORDER — EPHEDRINE SULFATE 50 MG/ML IJ SOLN
INTRAMUSCULAR | Status: DC | PRN
  Administered 2020-02-20: 10 mg via INTRAVENOUS

## 2020-02-20 SURGICAL SUPPLY — 53 items
ADH SKN CLS APL DERMABOND .7 (GAUZE/BANDAGES/DRESSINGS) ×1
APL PRP STRL LF DISP 70% ISPRP (MISCELLANEOUS) ×1
BLADE SURG 10 STRL SS (BLADE) ×2 IMPLANT
BLADE SURG 15 STRL LF DISP TIS (BLADE) IMPLANT
BLADE SURG 15 STRL SS (BLADE)
CANISTER SUC SOCK COL 7IN (MISCELLANEOUS) IMPLANT
CANISTER SUCT 1200ML W/VALVE (MISCELLANEOUS) ×2 IMPLANT
CHLORAPREP W/TINT 26 (MISCELLANEOUS) ×2 IMPLANT
CLIP VESOCCLUDE LG 6/CT (CLIP) ×2 IMPLANT
COVER BACK TABLE 60X90IN (DRAPES) ×2 IMPLANT
COVER MAYO STAND STRL (DRAPES) ×2 IMPLANT
COVER PROBE W GEL 5X96 (DRAPES) ×2 IMPLANT
COVER WAND RF STERILE (DRAPES) IMPLANT
DECANTER SPIKE VIAL GLASS SM (MISCELLANEOUS) IMPLANT
DERMABOND ADVANCED (GAUZE/BANDAGES/DRESSINGS) ×1
DERMABOND ADVANCED .7 DNX12 (GAUZE/BANDAGES/DRESSINGS) ×1 IMPLANT
DRAPE LAPAROSCOPIC ABDOMINAL (DRAPES) ×2 IMPLANT
DRAPE UTILITY XL STRL (DRAPES) ×2 IMPLANT
DRSG PAD ABDOMINAL 8X10 ST (GAUZE/BANDAGES/DRESSINGS) ×1 IMPLANT
ELECT COATED BLADE 2.86 ST (ELECTRODE) ×2 IMPLANT
ELECT REM PT RETURN 9FT ADLT (ELECTROSURGICAL) ×2
ELECTRODE REM PT RTRN 9FT ADLT (ELECTROSURGICAL) ×1 IMPLANT
GAUZE SPONGE 4X4 12PLY STRL LF (GAUZE/BANDAGES/DRESSINGS) ×2 IMPLANT
GLOVE BIO SURGEON STRL SZ 6 (GLOVE) ×2 IMPLANT
GLOVE BIOGEL M 6.5 STRL (GLOVE) ×2 IMPLANT
GLOVE BIOGEL PI IND STRL 6.5 (GLOVE) ×1 IMPLANT
GLOVE BIOGEL PI INDICATOR 6.5 (GLOVE) ×3
GOWN STRL REUS W/ TWL LRG LVL3 (GOWN DISPOSABLE) ×1 IMPLANT
GOWN STRL REUS W/TWL 2XL LVL3 (GOWN DISPOSABLE) ×2 IMPLANT
GOWN STRL REUS W/TWL LRG LVL3 (GOWN DISPOSABLE) ×6
KIT MARKER MARGIN INK (KITS) ×2 IMPLANT
LIGHT WAVEGUIDE WIDE FLAT (MISCELLANEOUS) ×1 IMPLANT
NDL HYPO 25X1 1.5 SAFETY (NEEDLE) ×1 IMPLANT
NEEDLE HYPO 25X1 1.5 SAFETY (NEEDLE) ×2 IMPLANT
NS IRRIG 1000ML POUR BTL (IV SOLUTION) ×2 IMPLANT
PACK BASIN DAY SURGERY FS (CUSTOM PROCEDURE TRAY) ×2 IMPLANT
PENCIL SMOKE EVACUATOR (MISCELLANEOUS) ×2 IMPLANT
SLEEVE SCD COMPRESS KNEE MED (MISCELLANEOUS) ×2 IMPLANT
SPONGE LAP 18X18 RF (DISPOSABLE) ×2 IMPLANT
STAPLER VISISTAT 35W (STAPLE) IMPLANT
STRIP CLOSURE SKIN 1/2X4 (GAUZE/BANDAGES/DRESSINGS) ×2 IMPLANT
SUT MON AB 4-0 PC3 18 (SUTURE) ×2 IMPLANT
SUT SILK 2 0 SH (SUTURE) IMPLANT
SUT VIC AB 2-0 SH 18 (SUTURE) IMPLANT
SUT VIC AB 3-0 SH 27 (SUTURE) ×2
SUT VIC AB 3-0 SH 27X BRD (SUTURE) ×1 IMPLANT
SUT VICRYL 3-0 CR8 SH (SUTURE) IMPLANT
SYR BULB EAR ULCER 3OZ GRN STR (SYRINGE) ×2 IMPLANT
SYR CONTROL 10ML LL (SYRINGE) ×2 IMPLANT
TOWEL GREEN STERILE FF (TOWEL DISPOSABLE) ×2 IMPLANT
TRAY FAXITRON CT DISP (TRAY / TRAY PROCEDURE) ×2 IMPLANT
TUBE CONNECTING 20X1/4 (TUBING) ×2 IMPLANT
YANKAUER SUCT BULB TIP NO VENT (SUCTIONS) ×2 IMPLANT

## 2020-02-20 NOTE — Discharge Instructions (Addendum)
[place] Office Phone Number [phone]  BREAST BIOPSY/ PARTIAL MASTECTOMY: POST OP INSTRUCTIONS  Always review your discharge instruction sheet given to you by the facility where your surgery was performed.  IF YOU HAVE DISABILITY OR FAMILY LEAVE FORMS, YOU MUST BRING THEM TO THE OFFICE FOR PROCESSING.  DO NOT GIVE THEM TO YOUR DOCTOR.  1. A prescription for pain medication may be given to you upon discharge.  Take your pain medication as prescribed, if needed.  If narcotic pain medicine is not needed, then you may take acetaminophen (Tylenol) or ibuprofen (Advil) as needed. 2. Take your usually prescribed medications unless otherwise directed 3. If you need a refill on your pain medication, please contact your pharmacy.  They will contact our office to request authorization.  Prescriptions will not be filled after 5pm or on week-ends. 4. You should eat very light the first 24 hours after surgery, such as soup, crackers, pudding, etc.  Resume your normal diet the day after surgery. 5. Most patients will experience some swelling and bruising in the breast.  Ice packs and a good support bra will help.  Swelling and bruising can take several days to resolve.  6. It is common to experience some constipation if taking pain medication after surgery.  Increasing fluid intake and taking a stool softener will usually help or prevent this problem from occurring.  A mild laxative (Milk of Magnesia or Miralax) should be taken according to package directions if there are no bowel movements after 48 hours. 7. Unless discharge instructions indicate otherwise, you may remove your bandages 48 hours after surgery, and you may shower at that time.  You may have steri-strips (small skin tapes) in place directly over the incision.  These strips should be left on the skin for 7-10 days.   Any sutures or staples will be removed at the office during your follow-up visit. 8. ACTIVITIES:  You may resume  regular daily activities (gradually increasing) beginning the next day.  Wearing a good support bra or sports bra (or the breast binder) minimizes pain and swelling.  You may have sexual intercourse when it is comfortable. a. You may drive when you no longer are taking prescription pain medication, you can comfortably wear a seatbelt, and you can safely maneuver your car and apply brakes. b. RETURN TO WORK:  __________1 week_______________ 9. You should see your doctor in the office for a follow-up appointment approximately two weeks after your surgery.  Your doctor's nurse will typically make your follow-up appointment when she calls you with your pathology report.  Expect your pathology report 2-3 business days after your surgery.  You may call to check if you do not hear from [place] after three days.   WHEN TO CALL YOUR DOCTOR: 1. Fever over 101.0 2. Nausea and/or vomiting. 3. Extreme swelling or bruising. 4. Continued bleeding from incision. 5. Increased pain, redness, or drainage from the incision.  The clinic staff is available to answer your questions during regular business hours.  Please don't hesitate to call and ask to speak to one of the nurses for clinical concerns.  If you have a medical emergency, go to the nearest emergency room or call 911.  A surgeon from [facility] is always on call at the hospital.  For further questions, please visit [email or web address]          Post Anesthesia Home Care Instructions  Activity: Get plenty of rest for the remainder of the day. A responsible individual  must stay with you for 24 hours following the procedure.  For the next 24 hours, DO NOT: -Drive a car -Paediatric nurse -Drink alcoholic beverages -Take any medication unless instructed by your physician -Make any legal decisions or sign important papers.  Meals: Start with liquid foods such as gelatin or soup. Progress to regular foods as tolerated. Avoid  greasy, spicy, heavy foods. If nausea and/or vomiting occur, drink only clear liquids until the nausea and/or vomiting subsides. Call your physician if vomiting continues.  Special Instructions/Symptoms: Your throat may feel dry or sore from the anesthesia or the breathing tube placed in your throat during surgery. If this causes discomfort, gargle with warm salt water. The discomfort should disappear within 24 hours.

## 2020-02-20 NOTE — Anesthesia Preprocedure Evaluation (Addendum)
Anesthesia Evaluation  Patient identified by MRN, date of birth, ID band Patient awake    Reviewed: Allergy & Precautions, NPO status , Patient's Chart, lab work & pertinent test results  History of Anesthesia Complications Negative for: history of anesthetic complications  Airway Mallampati: II  TM Distance: >3 FB Neck ROM: Full    Dental  (+) Teeth Intact   Pulmonary former smoker,    Pulmonary exam normal        Cardiovascular hypertension, Pt. on medications Normal cardiovascular exam     Neuro/Psych  Hx cerebral aneurysm s/p clipping (2005) negative psych ROS   GI/Hepatic Neg liver ROS, GERD  Medicated,  Endo/Other  Hypothyroidism  Obesity   Renal/GU CRFRenal disease     Musculoskeletal negative musculoskeletal ROS (+)   Abdominal   Peds  Hematology negative hematology ROS (+)   Anesthesia Other Findings Covid test negative   Reproductive/Obstetrics                            Anesthesia Physical Anesthesia Plan  ASA: III  Anesthesia Plan: General   Post-op Pain Management:    Induction: Intravenous  PONV Risk Score and Plan: 3 and Treatment may vary due to age or medical condition, Ondansetron and Dexamethasone  Airway Management Planned: LMA  Additional Equipment: None  Intra-op Plan:   Post-operative Plan: Extubation in OR  Informed Consent: I have reviewed the patients History and Physical, chart, labs and discussed the procedure including the risks, benefits and alternatives for the proposed anesthesia with the patient or authorized representative who has indicated his/her understanding and acceptance.     Dental advisory given  Plan Discussed with: CRNA and Anesthesiologist  Anesthesia Plan Comments:        Anesthesia Quick Evaluation

## 2020-02-20 NOTE — Anesthesia Procedure Notes (Signed)
Procedure Name: LMA Insertion Date/Time: 02/20/2020 1:30 PM Performed by: Willa Frater, CRNA Pre-anesthesia Checklist: Patient identified, Emergency Drugs available, Suction available and Patient being monitored Patient Re-evaluated:Patient Re-evaluated prior to induction Oxygen Delivery Method: Circle system utilized Preoxygenation: Pre-oxygenation with 100% oxygen Induction Type: IV induction Ventilation: Mask ventilation without difficulty LMA: LMA inserted LMA Size: 4.0 Number of attempts: 1 Airway Equipment and Method: Bite block Placement Confirmation: positive ETCO2 Tube secured with: Tape Dental Injury: Teeth and Oropharynx as per pre-operative assessment

## 2020-02-20 NOTE — Interval H&P Note (Signed)
History and Physical Interval Note:  02/20/2020 1:04 PM  Rachel [name]  has presented today for surgery, with the diagnosis of LEFT ABNORMAL MAMMOGRAM.  The various methods of treatment have been discussed with the patient and family. After consideration of risks, benefits and other options for treatment, the patient has consented to  Procedure(s) with comments: LEFT RADIOACTIVE SEED GUIDED EXCISIONAL BREAST BIOPSY (Left) - RNFA as a surgical intervention.  The patient's history has been reviewed, patient examined, no change in status, stable for surgery.  I have reviewed the patient's chart and labs.  Questions were answered to the patient's satisfaction.     Stark Klein

## 2020-02-20 NOTE — Transfer of Care (Signed)
Immediate Anesthesia Transfer of Care Note  Patient: Rachel [name]  Procedure(s) Performed: LEFT RADIOACTIVE SEED GUIDED EXCISIONAL BREAST BIOPSY (Left Breast)  Patient Location: PACU  Anesthesia Type:General  Level of Consciousness: awake, alert , oriented and drowsy  Airway & Oxygen Therapy: Patient Spontanous Breathing and Patient connected to face mask oxygen  Post-op Assessment: Report given to RN and Post -op Vital signs reviewed and stable  Post vital signs: Reviewed and stable  Last Vitals:  Vitals Value Taken Time  BP    Temp    Pulse 54 02/20/20 1429  Resp 10 02/20/20 1429  SpO2 100 % 02/20/20 1429  Vitals shown include unvalidated device data.  Last Pain:  Vitals:   02/20/20 1102  TempSrc: Oral  PainSc: 0-No pain         Complications: No complications documented.

## 2020-02-20 NOTE — Op Note (Signed)
Left Breast Radioactive seed localized excisional biopsy  Indications: This patient presents with history of abnormal left mammogram with discordant core needle biopsy.    Pre-operative Diagnosis: abnormal left mammogram    Post-operative Diagnosis: abnormal left mammogram  Surgeon: BYERLY,FAERA   Anesthesia: General endotracheal anesthesia  ASA Class: 3  Procedure Details  The patient was seen in the Holding Room. The risks, benefits, complications, treatment options, and expected outcomes were discussed with the patient. The possibilities of bleeding, infection, the need for additional procedures, failure to diagnose a condition, and creating a complication requiring transfusion or operation were discussed with the patient. The patient concurred with the proposed plan, giving informed consent.  The site of surgery properly noted/marked. The patient was taken to Operating Room # 1, identified, and the procedure verified as Left Breast seed localized excisional biopsy. A Time Out was held and the above information confirmed.  The left breast and chest were prepped and draped in standard fashion. A lateral circumareolar incision was made near the previously placed radioactive seed.  Dissection was carried down around the point of maximum signal intensity. The cautery was used to perform the dissection.   The specimen was inked with the margin marker paint kit.    Specimen radiography confirmed inclusion of the mammographic lesion, the clip, and the seed.  The background signal in the breast was zero.  One clip was placed in the breast cavity.  Hemostasis was achieved with cautery.  The wound was irrigated and closed with 3-0 vicryl interrupted deep dermal sutures and 4-0 monocryl running subcuticular suture.      Sterile dressings were applied. At the end of the operation, all sponge, instrument, and needle counts were correct.  Findings: Seed, clip in specimen.    Estimated Blood Loss:  min          Specimens: left breast tissue with seed         Complications:  None; patient tolerated the procedure well.         Disposition: PACU - hemodynamically stable.         Condition: stable  

## 2020-02-20 NOTE — Anesthesia Postprocedure Evaluation (Signed)
Anesthesia Post Note  Patient: Rachel [name]  Procedure(s) Performed: LEFT RADIOACTIVE SEED GUIDED EXCISIONAL BREAST BIOPSY (Left Breast)     Patient location during evaluation: PACU Anesthesia Type: General Level of consciousness: awake and alert Pain management: pain level controlled Vital Signs Assessment: post-procedure vital signs reviewed and stable Respiratory status: spontaneous breathing, nonlabored ventilation and respiratory function stable Cardiovascular status: blood pressure returned to baseline and stable Postop Assessment: no apparent nausea or vomiting Anesthetic complications: no   No complications documented.  Last Vitals:  Vitals:   02/20/20 1515 02/20/20 1530  BP: (!) 141/80 137/87  Pulse: 61 (!) 52  Resp: 13 16  Temp:    SpO2: 92% 99%    Last Pain:  Vitals:   02/20/20 1530  TempSrc:   PainSc: 0-No pain                 Lynda Rainwater

## 2020-02-21 ENCOUNTER — Encounter (HOSPITAL_BASED_OUTPATIENT_CLINIC_OR_DEPARTMENT_OTHER): Payer: Self-pay | Admitting: General Surgery

## 2020-02-23 LAB — SURGICAL PATHOLOGY

## 2020-02-24 ENCOUNTER — Other Ambulatory Visit: Payer: Self-pay | Admitting: Internal Medicine

## 2020-03-06 ENCOUNTER — Other Ambulatory Visit: Payer: Self-pay

## 2020-03-06 ENCOUNTER — Encounter: Payer: Self-pay | Admitting: Internal Medicine

## 2020-03-06 ENCOUNTER — Ambulatory Visit (INDEPENDENT_AMBULATORY_CARE_PROVIDER_SITE_OTHER): Payer: Medicare HMO

## 2020-03-06 ENCOUNTER — Ambulatory Visit (INDEPENDENT_AMBULATORY_CARE_PROVIDER_SITE_OTHER): Payer: Medicare HMO | Admitting: Internal Medicine

## 2020-03-06 VITALS — BP 118/70 | HR 87 | Temp 98.0°F | Ht 62.4 in | Wt 204.0 lb

## 2020-03-06 VITALS — BP 118/70 | HR 87 | Temp 98.7°F | Ht 62.4 in | Wt 204.0 lb

## 2020-03-06 DIAGNOSIS — I10 Essential (primary) hypertension: Secondary | ICD-10-CM | POA: Diagnosis not present

## 2020-03-06 DIAGNOSIS — E039 Hypothyroidism, unspecified: Secondary | ICD-10-CM

## 2020-03-06 DIAGNOSIS — I129 Hypertensive chronic kidney disease with stage 1 through stage 4 chronic kidney disease, or unspecified chronic kidney disease: Secondary | ICD-10-CM

## 2020-03-06 DIAGNOSIS — R7309 Other abnormal glucose: Secondary | ICD-10-CM | POA: Diagnosis not present

## 2020-03-06 DIAGNOSIS — Z0000 Encounter for general adult medical examination without abnormal findings: Secondary | ICD-10-CM

## 2020-03-06 DIAGNOSIS — N1831 Chronic kidney disease, stage 3a: Secondary | ICD-10-CM | POA: Diagnosis not present

## 2020-03-06 DIAGNOSIS — F5101 Primary insomnia: Secondary | ICD-10-CM

## 2020-03-06 DIAGNOSIS — Z23 Encounter for immunization: Secondary | ICD-10-CM

## 2020-03-06 DIAGNOSIS — M8580 Other specified disorders of bone density and structure, unspecified site: Secondary | ICD-10-CM

## 2020-03-06 DIAGNOSIS — Z6836 Body mass index (BMI) 36.0-36.9, adult: Secondary | ICD-10-CM

## 2020-03-06 LAB — POCT URINALYSIS DIPSTICK
Bilirubin, UA: NEGATIVE
Glucose, UA: NEGATIVE
Ketones, UA: NEGATIVE
Nitrite, UA: NEGATIVE
Protein, UA: NEGATIVE
Spec Grav, UA: 1.025 (ref 1.010–1.025)
Urobilinogen, UA: 0.2 E.U./dL
pH, UA: 7 (ref 5.0–8.0)

## 2020-03-06 LAB — POCT UA - MICROALBUMIN
Albumin/Creatinine Ratio, Urine, POC: <30
Creatinine, POC: 300 mg/dL
Microalbumin Ur, POC: 80 mg/L

## 2020-03-06 MED ORDER — SHINGRIX 50 MCG/0.5ML IM SUSR: 0.5000 mL | Freq: Once | INTRAMUSCULAR | 0 refills | Status: AC

## 2020-03-06 MED ORDER — DENOSUMAB 60 MG/ML ~~LOC~~ SOSY: 60.0000 mg | PREFILLED_SYRINGE | SUBCUTANEOUS | 1 refills | Status: DC

## 2020-03-06 MED ORDER — TEMAZEPAM 15 MG PO CAPS: ORAL_CAPSULE | ORAL | 1 refills | Status: DC

## 2020-03-06 NOTE — Progress Notes (Signed)
I,[name],acting as a Education administrator for Maximino Greenland, MD.,have documented all relevant documentation on the behalf of Maximino Greenland, MD,as directed by  Maximino Greenland, MD while in the presence of Maximino Greenland, MD.  This visit occurred during the SARS-CoV-2 public health emergency.  Safety protocols were in place, including screening questions prior to the visit, additional usage of staff PPE, and extensive cleaning of exam room while observing appropriate contact time as indicated for disinfecting solutions.  Subjective:     Patient ID: Rachel [name] , female    DOB: 05/13/1950 , 69 y.o.   MRN: [id number]   Chief Complaint  Patient presents with  . Annual Exam  . Hypertension    HPI  She is here today for a full physical examination. She is followed by Gyn for her pelvic exams. She denies having any specific concerns or complaints at this time.   Hypertension This is a chronic problem. The current episode started more than 1 year ago. The problem has been gradually improving since onset. The problem is controlled. Pertinent negatives include no blurred vision, chest pain, palpitations or shortness of breath. Risk factors for coronary artery disease include obesity, post-menopausal state and dyslipidemia. Past treatments include diuretics and angiotensin blockers. The current treatment provides moderate improvement. Compliance problems include exercise.  Hypertensive end-organ damage includes kidney disease.     Past Medical History:  Diagnosis Date  . Abnormal mammogram   . Brain aneurysm 2005  . GERD (gastroesophageal reflux disease)   . Hypertension   . Hypothyroidism   . Osteoporosis   . Thyroid disease      Family History  Problem Relation Age of Onset  . Cancer Mother        Breast   . Hypertension Mother   . Cancer Father        Prostate  . Hypertension Father   . Cancer Sister        Breast     Current Outpatient Medications:  .  calcium carbonate  (TUMS - DOSED IN MG ELEMENTAL CALCIUM) 500 MG chewable tablet, Chew 4 tablets by mouth 2 (two) times daily as needed for indigestion or heartburn., Disp: , Rfl:  .  Cholecalciferol (VITAMIN D3) 5000 units CAPS, Take 5,000 Units by mouth daily. , Disp: , Rfl:  .  COMBIGAN 0.2-0.5 % ophthalmic solution, Place 1 drop into the right eye 2 (two) times daily., Disp: , Rfl: 6 .  denosumab (PROLIA) 60 MG/ML SOSY injection, Inject 60 mg into the skin every 6 (six) months., Disp: 60 mL, Rfl: 1 .  oxyCODONE (OXY IR/ROXICODONE) 5 MG immediate release tablet, Take 1-2 tablets (5-10 mg total) by mouth every 6 (six) hours as needed for severe pain., Disp: 15 tablet, Rfl: 0 .  pantoprazole (PROTONIX) 40 MG tablet, TAKE 1 TABLET BY MOUTH ONCE A DAY, Disp: 90 tablet, Rfl: 2 .  simvastatin (ZOCOR) 40 MG tablet, TAKE 1 TABLET BY MOUTH EVERY DAY, Disp: 90 tablet, Rfl: 2 .  solifenacin (VESICARE) 5 MG tablet, Take 5 mg by mouth daily., Disp: , Rfl:  .  SYNTHROID 88 MCG tablet, TAKE 1 TABLET BY MOUTH DAILY BEFORE BREAKFAST. 1 TABLET ON MON - FRI AND 1/2 TAB SAT-SUN, Disp: 77 tablet, Rfl: 1 .  temazepam (RESTORIL) 15 MG capsule, TAKE 1 CAPSULE BY MOUTH EVERY DAY AT BEDTIME AS NEEDED, Disp: 30 capsule, Rfl: 1 .  terbinafine (LAMISIL) 250 MG tablet, Take 1 tablet (250 mg total) by mouth daily. (  Patient not taking: Reported on 03/06/2020), Disp: 90 tablet, Rfl: 0 .  valsartan-hydrochlorothiazide (DIOVAN-HCT) 80-12.5 MG tablet, TAKE 1 TABLET BY MOUTH EVERY DAY, Disp: 90 tablet, Rfl: 1   Allergies  Allergen Reactions  . Dilantin [Phenytoin Sodium Extended] Rash      The patient states she uses post menopausal status for birth control. Last LMP was No LMP recorded. Patient is postmenopausal.. Negative for Dysmenorrhea. Negative for: breast discharge, breast lump(s), breast pain and breast self exam. Associated symptoms include abnormal vaginal bleeding. Pertinent negatives include abnormal bleeding (hematology), anxiety,  decreased libido, depression, difficulty falling sleep, dyspareunia, history of infertility, nocturia, sexual dysfunction, sleep disturbances, urinary incontinence, urinary urgency, vaginal discharge and vaginal itching. Diet regular.The patient states her exercise level is  intermittent.   . The patient's tobacco use is:  Social History   Tobacco Use  Smoking Status Former Smoker  . Packs/day: 1.00  . Years: 25.00  . Pack years: 25.00  . Types: Cigarettes  . Quit date: 04/02/2004  . Years since quitting: 15.9  Smokeless Tobacco Never Used  . She has been exposed to passive smoke. The patient's alcohol use is:  Social History   Substance and Sexual Activity  Alcohol Use No   Review of Systems  Constitutional: Negative.   HENT: Negative.   Eyes: Negative.  Negative for blurred vision.  Respiratory: Negative.  Negative for shortness of breath.   Cardiovascular: Negative.  Negative for chest pain and palpitations.  Gastrointestinal: Negative.   Endocrine: Negative.   Genitourinary: Negative.   Musculoskeletal: Negative.   Skin: Negative.   Allergic/Immunologic: Negative.   Neurological: Negative.   Hematological: Negative.   Psychiatric/Behavioral: Negative.      Today's Vitals   03/06/20 1119  BP: 118/70  Pulse: 87  Temp: 98.7 F (37.1 C)  TempSrc: Oral  Weight: 204 lb (92.5 kg)  Height: 5' 2.4" (1.585 m)   Body mass index is 36.84 kg/m.  Wt Readings from Last 3 Encounters:  03/12/20 202 lb (91.6 kg)  03/06/20 204 lb (92.5 kg)  03/06/20 204 lb (92.5 kg)   Objective:  Physical Exam Constitutional:      General: She is not in acute distress.    Appearance: Normal appearance. She is well-developed.  HENT:     Head: Normocephalic and atraumatic.     Right Ear: Hearing, tympanic membrane, ear canal and external ear normal. There is no impacted cerumen.     Left Ear: Hearing, tympanic membrane, ear canal and external ear normal. There is no impacted cerumen.      Nose: Nose normal.     Mouth/Throat:     Mouth: Mucous membranes are dry.  Eyes:     General: Lids are normal.     Extraocular Movements: Extraocular movements intact.     Conjunctiva/sclera: Conjunctivae normal.     Pupils: Pupils are equal, round, and reactive to light.     Funduscopic exam:    Right eye: No papilledema.        Left eye: No papilledema.  Neck:     Thyroid: No thyroid mass.     Vascular: No carotid bruit.  Cardiovascular:     Rate and Rhythm: Normal rate and regular rhythm.     Pulses: Normal pulses.     Heart sounds: Normal heart sounds. No murmur heard.   Pulmonary:     Effort: Pulmonary effort is normal.     Breath sounds: Normal breath sounds.  Abdominal:     General:  Abdomen is flat. Bowel sounds are normal. There is no distension.     Palpations: Abdomen is soft.     Tenderness: There is no abdominal tenderness.  Musculoskeletal:        General: No swelling. Normal range of motion.     Cervical back: Full passive range of motion without pain, normal range of motion and neck supple.     Right lower leg: No edema.     Left lower leg: No edema.  Skin:    General: Skin is warm and dry.     Capillary Refill: Capillary refill takes less than 2 seconds.  Neurological:     General: No focal deficit present.     Mental Status: She is alert and oriented to person, place, and time.     Cranial Nerves: No cranial nerve deficit.     Sensory: No sensory deficit.  Psychiatric:        Mood and Affect: Mood normal.        Behavior: Behavior normal.        Thought Content: Thought content normal.        Judgment: Judgment normal.         Assessment And Plan:     1. Routine general medical examination at health care facility Comments: A full exam was performed. Importance of monthly self breast exams was discussed with the patient. PATIENT IS ADVISED TO GET 30-45 MINUTES REGULAR EXERCISE NO LESS THAN FOUR TO FIVE DAYS PER WEEK - BOTH WEIGHTBEARING EXERCISES AND  AEROBIC ARE RECOMMENDED.  PATIENT IS ADVISED TO FOLLOW A HEALTHY DIET WITH AT LEAST SIX FRUITS/VEGGIES PER DAY, DECREASE INTAKE OF RED MEAT, AND TO INCREASE FISH INTAKE TO TWO DAYS PER WEEK.  MEATS/FISH SHOULD NOT BE FRIED, BAKED OR BROILED IS PREFERABLE.  I SUGGEST WEARING SPF 50 SUNSCREEN ON EXPOSED PARTS AND ESPECIALLY WHEN IN THE DIRECT SUNLIGHT FOR AN EXTENDED PERIOD OF TIME.  PLEASE AVOID FAST FOOD RESTAURANTS AND INCREASE YOUR WATER INTAKE.   2. Hypertensive nephropathy Comments: Chronic, well controlled. She will continue with current meds. She is encouraged to avoid adding salt to her foods. EKG performed, NSR w/o acute changes. She will rto in six months for re-evaluation.  - CBC - Lipid panel - EKG 12-Lead  3. Stage 3a chronic kidney disease ([place]) Comments: Chronic, I will check renal function today. She is aware that adequate hydration and optimal BP control will help to prevent CKD progression. I will check Renal labs as listed below.  - Protein electrophoresis, serum - Phosphorus - Parathyroid Hormone, Intact w/Ca  4. Primary hypothyroidism Comments: I will check a thyroid panel and adjust meds as needed.  - TSH - T4, Free  5. Other abnormal glucose Comments: Her a1c has been elevated, I will recheck this today. Encouraged to limit her intake of sugary beverages, including diet.  - Hemoglobin A1c  6. Osteopenia, unspecified location Comments: Rx Prolia was sent to mail order pharmacy. She will rto for administration once we receive her prescription. Encouraged to continue with Calcium/vitamin D supplementation. Also advised to engage in weight-bearing exercises 2-3x weekly.   7. Primary insomnia Comments: Chronic,encouraged to develop bedtime routine.  She was also given refill temazepam to use nightly prn.  - temazepam (RESTORIL) 15 MG capsule; TAKE 1 CAPSULE BY MOUTH EVERY DAY AT BEDTIME AS NEEDED  Dispense: 30 capsule; Refill: 1  8. Class 2 severe obesity due to excess  calories with serious comorbidity and body mass index (BMI) of  36.0 to 36.9 in adult [facility]) Comments: She is encouraged to aim for at least 150 minutes of exercise per week. Also advised to aim for BMI less than 30 to decrease cardiac risk.     Patient was given opportunity to ask questions. Patient verbalized understanding of the plan and was able to repeat key elements of the plan. All questions were answered to their satisfaction.   Maximino Greenland, MD   I, Maximino Greenland, MD, have reviewed all documentation for this visit. The documentation on 03/23/20 for the exam, diagnosis, procedures, and orders are all accurate and complete.  THE PATIENT IS ENCOURAGED TO PRACTICE SOCIAL DISTANCING DUE TO THE COVID-19 PANDEMIC.

## 2020-03-06 NOTE — Patient Instructions (Signed)
Health Maintenance, Female Adopting a healthy lifestyle and getting preventive care are important in promoting health and wellness. Ask your health care provider about:  The right schedule for you to have regular tests and exams.  Things you can do on your own to prevent diseases and keep yourself healthy. What should I know about diet, weight, and exercise? Eat a healthy diet   Eat a diet that includes plenty of vegetables, fruits, low-fat dairy products, and lean protein.  Do not eat a lot of foods that are high in solid fats, added sugars, or sodium. Maintain a healthy weight Body mass index (BMI) is used to identify weight problems. It estimates body fat based on height and weight. Your health care provider can help determine your BMI and help you achieve or maintain a healthy weight. Get regular exercise Get regular exercise. This is one of the most important things you can do for your health. Most adults should:  Exercise for at least 150 minutes each week. The exercise should increase your heart rate and make you sweat (moderate-intensity exercise).  Do strengthening exercises at least twice a week. This is in addition to the moderate-intensity exercise.  Spend less time sitting. Even light physical activity can be beneficial. Watch cholesterol and blood lipids Have your blood tested for lipids and cholesterol at 68 years of age, then have this test every 5 years. Have your cholesterol levels checked more often if:  Your lipid or cholesterol levels are high.  You are older than 68 years of age.  You are at high risk for heart disease. What should I know about cancer screening? Depending on your health history and family history, you may need to have cancer screening at various ages. This may include screening for:  Breast cancer.  Cervical cancer.  Colorectal cancer.  Skin cancer.  Lung cancer. What should I know about heart disease, diabetes, and high blood  pressure? Blood pressure and heart disease  High blood pressure causes heart disease and increases the risk of stroke. This is more likely to develop in people who have high blood pressure readings, are of African descent, or are overweight.  Have your blood pressure checked: ? Every 3-5 years if you are 18-39 years of age. ? Every year if you are 40 years old or older. Diabetes Have regular diabetes screenings. This checks your fasting blood sugar level. Have the screening done:  Once every three years after age 40 if you are at a normal weight and have a low risk for diabetes.  More often and at a younger age if you are overweight or have a high risk for diabetes. What should I know about preventing infection? Hepatitis B If you have a higher risk for hepatitis B, you should be screened for this virus. Talk with your health care provider to find out if you are at risk for hepatitis B infection. Hepatitis C Testing is recommended for:  Everyone born from 1945 through 1965.  Anyone with known risk factors for hepatitis C. Sexually transmitted infections (STIs)  Get screened for STIs, including gonorrhea and chlamydia, if: ? You are sexually active and are younger than 68 years of age. ? You are older than 68 years of age and your health care provider tells you that you are at risk for this type of infection. ? Your sexual activity has changed since you were last screened, and you are at increased risk for chlamydia or gonorrhea. Ask your health care provider if   you are at risk.  Ask your health care provider about whether you are at high risk for HIV. Your health care provider may recommend a prescription medicine to help prevent HIV infection. If you choose to take medicine to prevent HIV, you should first get tested for HIV. You should then be tested every 3 months for as long as you are taking the medicine. Pregnancy  If you are about to stop having your period (premenopausal) and  you may become pregnant, seek counseling before you get pregnant.  Take 400 to 800 micrograms (mcg) of folic acid every day if you become pregnant.  Ask for birth control (contraception) if you want to prevent pregnancy. Osteoporosis and menopause Osteoporosis is a disease in which the bones lose minerals and strength with aging. This can result in bone fractures. If you are 65 years old or older, or if you are at risk for osteoporosis and fractures, ask your health care provider if you should:  Be screened for bone loss.  Take a calcium or vitamin D supplement to lower your risk of fractures.  Be given hormone replacement therapy (HRT) to treat symptoms of menopause. Follow these instructions at home: Lifestyle  Do not use any products that contain nicotine or tobacco, such as cigarettes, e-cigarettes, and chewing tobacco. If you need help quitting, ask your health care provider.  Do not use street drugs.  Do not share needles.  Ask your health care provider for help if you need support or information about quitting drugs. Alcohol use  Do not drink alcohol if: ? Your health care provider tells you not to drink. ? You are pregnant, may be pregnant, or are planning to become pregnant.  If you drink alcohol: ? Limit how much you use to 0-1 drink a day. ? Limit intake if you are breastfeeding.  Be aware of how much alcohol is in your drink. In the [place] one drink equals one 12 oz bottle of beer (355 mL), one 5 oz glass of wine (148 mL), or one 1 oz glass of hard liquor (44 mL). General instructions  Schedule regular health, dental, and eye exams.  Stay current with your vaccines.  Tell your health care provider if: ? You often feel depressed. ? You have ever been abused or do not feel safe at home. Summary  Adopting a healthy lifestyle and getting preventive care are important in promoting health and wellness.  Follow your health care provider's instructions about healthy  diet, exercising, and getting tested or screened for diseases.  Follow your health care provider's instructions on monitoring your cholesterol and blood pressure. This information is not intended to replace advice given to you by your health care provider. Make sure you discuss any questions you have with your health care provider. Document Revised: 04/06/2018 Document Reviewed: 04/06/2018 [employer] Patient Education  2020 [employer]  

## 2020-03-06 NOTE — Progress Notes (Signed)
This visit occurred during the SARS-CoV-2 public health emergency.  Safety protocols were in place, including screening questions prior to the visit, additional usage of staff PPE, and extensive cleaning of exam room while observing appropriate contact time as indicated for disinfecting solutions.  Subjective:   Rachel [name] is a 69 y.o. female who presents for Medicare Annual (Subsequent) preventive examination.  Review of Systems     Cardiac Risk Factors include: advanced age (>81men, >43 women);hypertension;obesity (BMI >30kg/m2)     Objective:    Today's Vitals   03/06/20 1056  BP: 118/70  Pulse: 87  Temp: 98 F (36.7 C)  TempSrc: Oral  SpO2: 93%  Weight: 204 lb (92.5 kg)  Height: 5' 2.4" (1.585 m)   Body mass index is 36.84 kg/m.  Advanced Directives 03/06/2020 02/20/2020 02/13/2020 03/01/2019 11/18/2015 11/12/2015 11/01/2015  Does Patient Have a Medical Advance Directive? No No No No No No No  Would patient like information on creating a medical advance directive? No - Patient declined No - Patient declined No - Patient declined - Yes - Scientist, clinical (histocompatibility and immunogenetics) given Yes - Scientist, clinical (histocompatibility and immunogenetics) given Yes - Scientist, clinical (histocompatibility and immunogenetics) given    Current Medications (verified) Outpatient Encounter Medications as of 03/06/2020  Medication Sig  . calcium carbonate (TUMS - DOSED IN MG ELEMENTAL CALCIUM) 500 MG chewable tablet Chew 4 tablets by mouth 2 (two) times daily as needed for indigestion or heartburn.  . Cholecalciferol (VITAMIN D3) 5000 units CAPS Take 5,000 Units by mouth daily.   . COMBIGAN 0.2-0.5 % ophthalmic solution Place 1 drop into the right eye 2 (two) times daily.  Marland Kitchen oxyCODONE (OXY IR/ROXICODONE) 5 MG immediate release tablet Take 1-2 tablets (5-10 mg total) by mouth every 6 (six) hours as needed for severe pain.  . pantoprazole (PROTONIX) 40 MG tablet TAKE 1 TABLET BY MOUTH ONCE A DAY  . simvastatin (ZOCOR) 40 MG tablet TAKE 1 TABLET BY MOUTH EVERY DAY  . solifenacin  (VESICARE) 5 MG tablet Take 5 mg by mouth daily.  Marland Kitchen SYNTHROID 88 MCG tablet TAKE 1 TABLET BY MOUTH DAILY BEFORE BREAKFAST. 1 TABLET ON MON - FRI AND 1/2 TAB SAT-SUN  . valsartan-hydrochlorothiazide (DIOVAN-HCT) 80-12.5 MG tablet TAKE 1 TABLET BY MOUTH EVERY DAY  . [DISCONTINUED] denosumab (PROLIA) 60 MG/ML SOSY injection Inject 60 mg into the skin every 6 (six) months.  . [DISCONTINUED] temazepam (RESTORIL) 15 MG capsule TAKE 1 CAPSULE BY MOUTH EVERY DAY AT BEDTIME AS NEEDED  . terbinafine (LAMISIL) 250 MG tablet Take 1 tablet (250 mg total) by mouth daily. (Patient not taking: Reported on 03/06/2020)  . Zoster Vaccine Adjuvanted [facility]) injection Inject 0.5 mLs into the muscle once for 1 dose.   No facility-administered encounter medications on file as of 03/06/2020.    Allergies (verified) Dilantin [phenytoin sodium extended]   History: Past Medical History:  Diagnosis Date  . Abnormal mammogram   . Brain aneurysm 2005  . GERD (gastroesophageal reflux disease)   . Hypertension   . Hypothyroidism   . Osteoporosis   . Thyroid disease    Past Surgical History:  Procedure Laterality Date  . BRAIN SURGERY     2005    DR CABBELL   . COLONOSCOPY    . LAPAROSCOPIC BILATERAL SALPINGO OOPHERECTOMY Bilateral 11/12/2015   Procedure: LAPAROSCOPIC BILATERAL SALPINGO OOPHORECTOMY;  Surgeon: Everett Graff, MD;  Location: [facility];  Service: Gynecology;  Laterality: Bilateral;  . LAPAROSCOPY N/A 11/22/2015   Procedure: LAPAROSCOPY DIAGNOSTIC with repair of serosal tear;  Surgeon:  Everett Graff, MD;  Location: [facility];  Service: Gynecology;  Laterality: N/A;  . RADIOACTIVE SEED GUIDED EXCISIONAL BREAST BIOPSY Left 09/26/2015   Procedure: LEFT RADIOACTIVE SEED GUIDED EXCISIONAL BREAST BIOPSY;  Surgeon: Stark Klein, MD;  Location: [facility];  Service: General;  Laterality: Left;  . RADIOACTIVE SEED GUIDED EXCISIONAL BREAST BIOPSY Left 02/20/2020   Procedure: LEFT RADIOACTIVE SEED GUIDED EXCISIONAL  BREAST BIOPSY;  Surgeon: Stark Klein, MD;  Location: [facility];  Service: General;  Laterality: Left;  RNFA  . WISDOM TOOTH EXTRACTION     Family History  Problem Relation Age of Onset  . Cancer Mother        Breast   . Hypertension Mother   . Cancer Father        Prostate  . Hypertension Father   . Cancer Sister        Breast   Social History   Socioeconomic History  . Marital status: Married    Spouse name: Not on file  . Number of children: Not on file  . Years of education: Not on file  . Highest education level: Not on file  Occupational History  . Occupation: retired  Tobacco Use  . Smoking status: Former Smoker    Packs/day: 1.00    Years: 25.00    Pack years: 25.00    Types: Cigarettes    Quit date: 04/02/2004    Years since quitting: 15.9  . Smokeless tobacco: Never Used  Vaping Use  . Vaping Use: Never used  Substance and Sexual Activity  . Alcohol use: No  . Drug use: No  . Sexual activity: Yes    Birth control/protection: Post-menopausal    Comment: bil  salpingo-oopherectomy  Other Topics Concern  . Not on file  Social History Narrative  . Not on file   Social Determinants of Health   Financial Resource Strain: Low Risk   . Difficulty of Paying Living Expenses: Not hard at all  Food Insecurity: No Food Insecurity  . Worried About Charity fundraiser in the Last Year: Never true  . Ran Out of Food in the Last Year: Never true  Transportation Needs: No Transportation Needs  . Lack of Transportation (Medical): No  . Lack of Transportation (Non-Medical): No  Physical Activity: Sufficiently Active  . Days of Exercise per Week: 5 days  . Minutes of Exercise per Session: 30 min  Stress: No Stress Concern Present  . Feeling of Stress : Not at all  Social Connections:   . Frequency of Communication with Friends and Family: Not on file  . Frequency of Social Gatherings with Friends and Family: Not on file  . Attends Religious  Services: Not on file  . Active Member of Clubs or Organizations: Not on file  . Attends Archivist Meetings: Not on file  . Marital Status: Not on file    Tobacco Counseling Counseling given: Not Answered   Clinical Intake:  Pre-visit preparation completed: Yes  Pain : No/denies pain     Nutritional Status: BMI > 30  Obese Nutritional Risks: None Diabetes: No  How often do you need to have someone help you when you read instructions, pamphlets, or other written materials from your doctor or pharmacy?: 1 - Never What is the last grade level you completed in school?: 14 yrs  Diabetic? no  Interpreter Needed?: No  Information entered by :: NAllen LPN   Activities of Daily Living In your present state of health, do  you have any difficulty performing the following activities: 03/06/2020 02/20/2020  Hearing? N N  Vision? N N  Difficulty concentrating or making decisions? N N  Walking or climbing stairs? N N  Dressing or bathing? N N  Doing errands, shopping? N -  Preparing Food and eating ? N -  Using the Toilet? N -  In the past six months, have you accidently leaked urine? Y -  Comment on vesicare -  Do you have problems with loss of bowel control? N -  Managing your Medications? N -  Managing your Finances? N -  Housekeeping or managing your Housekeeping? N -  Some recent data might be hidden    Patient Care Team: Glendale Chard, MD as PCP - General (Internal Medicine)  Indicate any recent Medical Services you may have received from other than [facility] providers in the past year (date may be approximate).     Assessment:   This is a routine wellness examination for Josslyn.  Hearing/Vision screen  Hearing Screening   125Hz  250Hz  500Hz  1000Hz  2000Hz  3000Hz  4000Hz  6000Hz  8000Hz   Right ear:           Left ear:           Vision Screening Comments: Regular eye exams, Dr. Venetia Maxon  Dietary issues and exercise activities discussed: Current Exercise  Habits: Home exercise routine, Type of exercise: treadmill, Time (Minutes): 30, Frequency (Times/Week): 5, Weekly Exercise (Minutes/Week): 150  Goals    . Patient Stated     03/06/2020, no goals    . Weight (lb) < 200 lb (90.7 kg)     03/01/2019, lose weight to feel better and get off some medications      Depression Screen PHQ 2/9 Scores 03/06/2020 03/01/2019 08/31/2018 02/25/2018  PHQ - 2 Score 0 0 0 0    Fall Risk Fall Risk  03/06/2020 03/01/2019 03/01/2019 08/31/2018 02/25/2018  Falls in the past year? 0 0 0 0 0  Number falls in past yr: - 0 - - -  Risk for fall due to : Medication side effect Medication side effect - - -  Follow up Falls evaluation completed;Education provided;Falls prevention discussed Falls evaluation completed;Education provided;Falls prevention discussed - - -    Any stairs in or around the home? No  If so, are there any without handrails? n/a Home free of loose throw rugs in walkways, pet beds, electrical cords, etc? No  Adequate lighting in your home to reduce risk of falls? Yes   ASSISTIVE DEVICES UTILIZED TO PREVENT FALLS:  Life alert? No  Use of a cane, walker or w/c? No  Grab bars in the bathroom? No  Shower chair or bench in shower? No  Elevated toilet seat or a handicapped toilet? Yes   TIMED UP AND GO:  Was the test performed? No .     Gait steady and fast without use of assistive device  Cognitive Function:     6CIT Screen 03/06/2020 03/01/2019  What Year? 0 points 0 points  What month? 0 points 0 points  What time? 0 points 0 points  Count back from 20 0 points 0 points  Months in reverse 2 points 0 points  Repeat phrase 2 points 4 points  Total Score 4 4    Immunizations Immunization History  Administered Date(s) Administered  . Fluad Quad(high Dose 65+) 03/06/2020  . Influenza, High Dose Seasonal PF 02/25/2018, 03/01/2019  . Influenza-Unspecified 01/25/2017  . PFIZER SARS-COV-2 Vaccination 06/03/2019, 06/28/2019, 01/30/2020  .  Pneumococcal  Conjugate-13 03/22/2013    TDAP status: Up to date Flu Vaccine status: Completed at today's visit Pneumococcal vaccine status: Up to date Covid-19 vaccine status: Completed vaccines  Qualifies for Shingles Vaccine? Yes   Zostavax completed No   Shingrix Completed?: No.    Education has been provided regarding the importance of this vaccine. Patient has been advised to call insurance company to determine out of pocket expense if they have not yet received this vaccine. Advised may also receive vaccine at local pharmacy or Health Dept. Verbalized acceptance and understanding.  Screening Tests Health Maintenance  Topic Date Due  . PNA vac Low Risk Adult (2 of 2 - PPSV23) 09/03/2016  . MAMMOGRAM  11/20/2021  . TETANUS/TDAP  11/12/2026  . COLONOSCOPY  03/16/2028  . INFLUENZA VACCINE  Completed  . DEXA SCAN  Completed  . COVID-19 Vaccine  Completed  . Hepatitis C Screening  Completed    Health Maintenance  Health Maintenance Due  Topic Date Due  . PNA vac Low Risk Adult (2 of 2 - PPSV23) 09/03/2016    Colorectal cancer screening: Completed 03/16/2018. Repeat every 10 years Mammogram status: Completed 11/21/2019. Repeat every year Bone Density status: Completed 12/13/2018. Results reflect: Bone density results: OSTEOPENIA. Repeat every 2 years.  Lung Cancer Screening: (Low Dose CT Chest recommended if Age 61-80 years, 30 pack-year currently smoking OR have quit w/in 15years.) does not qualify.   Lung Cancer Screening Referral: no  Additional Screening:  Hepatitis C Screening: does qualify; Completed 02/25/2018  Vision Screening: Recommended annual ophthalmology exams for early detection of glaucoma and other disorders of the eye. Is the patient up to date with their annual eye exam?  Yes  Who is the provider or what is the name of the office in which the patient attends annual eye exams? Dr. Venetia Maxon If pt is not established with a provider, would they like to be  referred to a provider to establish care? No .   Dental Screening: Recommended annual dental exams for proper oral hygiene  Community Resource Referral / Chronic Care Management: CRR required this visit?  No   CCM required this visit?  No      Plan:     I have personally reviewed and noted the following in the patient's chart:   . Medical and social history . Use of alcohol, tobacco or illicit drugs  . Current medications and supplements . Functional ability and status . Nutritional status . Physical activity . Advanced directives . List of other physicians . Hospitalizations, surgeries, and ER visits in previous 12 months . Vitals . Screenings to include cognitive, depression, and falls . Referrals and appointments  In addition, I have reviewed and discussed with patient certain preventive protocols, quality metrics, and best practice recommendations. A written personalized care plan for preventive services as well as general preventive health recommendations were provided to patient.     Kellie Simmering, LPN   16/01/9603   Nurse Notes:

## 2020-03-06 NOTE — Patient Instructions (Addendum)
Rachel [name] , Thank you for taking time to come for your Medicare Wellness Visit. I appreciate your ongoing commitment to your health goals. Please review the following plan we discussed and let me know if I can assist you in the future.   Screening recommendations/referrals: Colonoscopy: completed 03/16/2018 Mammogram: completed 11/21/2019 Bone Density: completed 12/13/2018 Recommended yearly ophthalmology/optometry visit for glaucoma screening and checkup Recommended yearly dental visit for hygiene and checkup  Vaccinations: Influenza vaccine: today Pneumococcal vaccine: will get at a later date Tdap vaccine: completed 11/11/2016 Shingles vaccine: sent to pharmacy   Covid-19:01/30/2020, 06/28/2019, 06/03/2019  Advanced directives: Advance directive discussed with you today. Even though you declined this today please call our office should you change your mind and we can give you the proper paperwork for you to fill out.  Conditions/risks identified: none  Next appointment: 09/05/2020 at 2:30 Follow up in one year for your annual wellness visit    Preventive Care 65 Years and Older, Female Preventive care refers to lifestyle choices and visits with your health care provider that can promote health and wellness. What does preventive care include?  A yearly physical exam. This is also called an annual well check.  Dental exams once or twice a year.  Routine eye exams. Ask your health care provider how often you should have your eyes checked.  Personal lifestyle choices, including:  Daily care of your teeth and gums.  Regular physical activity.  Eating a healthy diet.  Avoiding tobacco and drug use.  Limiting alcohol use.  Practicing safe sex.  Taking low-dose aspirin every day.  Taking vitamin and mineral supplements as recommended by your health care provider. What happens during an annual well check? The services and screenings done by your health care provider during  your annual well check will depend on your age, overall health, lifestyle risk factors, and family history of disease. Counseling  Your health care provider may ask you questions about your:  Alcohol use.  Tobacco use.  Drug use.  Emotional well-being.  Home and relationship well-being.  Sexual activity.  Eating habits.  History of falls.  Memory and ability to understand (cognition).  Work and work Statistician.  Reproductive health. Screening  You may have the following tests or measurements:  Height, weight, and BMI.  Blood pressure.  Lipid and cholesterol levels. These may be checked every 5 years, or more frequently if you are over 20 years old.  Skin check.  Lung cancer screening. You may have this screening every year starting at age 28 if you have a 30-pack-year history of smoking and currently smoke or have quit within the past 15 years.  Fecal occult blood test (FOBT) of the stool. You may have this test every year starting at age 45.  Flexible sigmoidoscopy or colonoscopy. You may have a sigmoidoscopy every 5 years or a colonoscopy every 10 years starting at age 13.  Hepatitis C blood test.  Hepatitis B blood test.  Sexually transmitted disease (STD) testing.  Diabetes screening. This is done by checking your blood sugar (glucose) after you have not eaten for a while (fasting). You may have this done every 1-3 years.  Bone density scan. This is done to screen for osteoporosis. You may have this done starting at age 68.  Mammogram. This may be done every 1-2 years. Talk to your health care provider about how often you should have regular mammograms. Talk with your health care provider about your test results, treatment options, and if necessary,  the need for more tests. Vaccines  Your health care provider may recommend certain vaccines, such as:  Influenza vaccine. This is recommended every year.  Tetanus, diphtheria, and acellular pertussis (Tdap,  Td) vaccine. You may need a Td booster every 10 years.  Zoster vaccine. You may need this after age 82.  Pneumococcal 13-valent conjugate (PCV13) vaccine. One dose is recommended after age 71.  Pneumococcal polysaccharide (PPSV23) vaccine. One dose is recommended after age 7. Talk to your health care provider about which screenings and vaccines you need and how often you need them. This information is not intended to replace advice given to you by your health care provider. Make sure you discuss any questions you have with your health care provider. Document Released: 05/10/2015 Document Revised: 01/01/2016 Document Reviewed: 02/12/2015 Elsevier Interactive Patient Education  2017 [employer] Prevention in the Home Falls can cause injuries. They can happen to people of all ages. There are many things you can do to make your home safe and to help prevent falls. What can I do on the outside of my home?  Regularly fix the edges of walkways and driveways and fix any cracks.  Remove anything that might make you trip as you walk through a door, such as a raised step or threshold.  Trim any bushes or trees on the path to your home.  Use bright outdoor lighting.  Clear any walking paths of anything that might make someone trip, such as rocks or tools.  Regularly check to see if handrails are loose or broken. Make sure that both sides of any steps have handrails.  Any raised decks and porches should have guardrails on the edges.  Have any leaves, snow, or ice cleared regularly.  Use sand or salt on walking paths during winter.  Clean up any spills in your garage right away. This includes oil or grease spills. What can I do in the bathroom?  Use night lights.  Install grab bars by the toilet and in the tub and shower. Do not use towel bars as grab bars.  Use non-skid mats or decals in the tub or shower.  If you need to sit down in the shower, use a plastic, non-slip  stool.  Keep the floor dry. Clean up any water that spills on the floor as soon as it happens.  Remove soap buildup in the tub or shower regularly.  Attach bath mats securely with double-sided non-slip rug tape.  Do not have throw rugs and other things on the floor that can make you trip. What can I do in the bedroom?  Use night lights.  Make sure that you have a light by your bed that is easy to reach.  Do not use any sheets or blankets that are too big for your bed. They should not hang down onto the floor.  Have a firm chair that has side arms. You can use this for support while you get dressed.  Do not have throw rugs and other things on the floor that can make you trip. What can I do in the kitchen?  Clean up any spills right away.  Avoid walking on wet floors.  Keep items that you use a lot in easy-to-reach places.  If you need to reach something above you, use a strong step stool that has a grab bar.  Keep electrical cords out of the way.  Do not use floor polish or wax that makes floors slippery. If you must use  wax, use non-skid floor wax.  Do not have throw rugs and other things on the floor that can make you trip. What can I do with my stairs?  Do not leave any items on the stairs.  Make sure that there are handrails on both sides of the stairs and use them. Fix handrails that are broken or loose. Make sure that handrails are as long as the stairways.  Check any carpeting to make sure that it is firmly attached to the stairs. Fix any carpet that is loose or worn.  Avoid having throw rugs at the top or bottom of the stairs. If you do have throw rugs, attach them to the floor with carpet tape.  Make sure that you have a light switch at the top of the stairs and the bottom of the stairs. If you do not have them, ask someone to add them for you. What else can I do to help prevent falls?  Wear shoes that:  Do not have high heels.  Have rubber bottoms.  Are  comfortable and fit you well.  Are closed at the toe. Do not wear sandals.  If you use a stepladder:  Make sure that it is fully opened. Do not climb a closed stepladder.  Make sure that both sides of the stepladder are locked into place.  Ask someone to hold it for you, if possible.  Clearly mark and make sure that you can see:  Any grab bars or handrails.  First and last steps.  Where the edge of each step is.  Use tools that help you move around (mobility aids) if they are needed. These include:  Canes.  Walkers.  Scooters.  Crutches.  Turn on the lights when you go into a dark area. Replace any light bulbs as soon as they burn out.  Set up your furniture so you have a clear path. Avoid moving your furniture around.  If any of your floors are uneven, fix them.  If there are any pets around you, be aware of where they are.  Review your medicines with your doctor. Some medicines can make you feel dizzy. This can increase your chance of falling. Ask your doctor what other things that you can do to help prevent falls. This information is not intended to replace advice given to you by your health care provider. Make sure you discuss any questions you have with your health care provider. Document Released: 02/07/2009 Document Revised: 09/19/2015 Document Reviewed: 05/18/2014 Elsevier Interactive Patient Education  2017 [employer].

## 2020-03-07 ENCOUNTER — Telehealth: Payer: Self-pay

## 2020-03-07 ENCOUNTER — Other Ambulatory Visit: Payer: Self-pay

## 2020-03-07 LAB — T4, FREE: Free T4: 1.89 ng/dL — ABNORMAL HIGH (ref 0.82–1.77)

## 2020-03-07 LAB — PROTEIN ELECTROPHORESIS, SERUM
A/G Ratio: 0.9 (ref 0.7–1.7)
Albumin ELP: 3.6 g/dL (ref 2.9–4.4)
Alpha 1: 0.2 g/dL (ref 0.0–0.4)
Alpha 2: 0.9 g/dL (ref 0.4–1.0)
Beta: 1.2 g/dL (ref 0.7–1.3)
Gamma Globulin: 1.6 g/dL (ref 0.4–1.8)
Globulin, Total: 3.9 g/dL (ref 2.2–3.9)
Total Protein: 7.5 g/dL (ref 6.0–8.5)

## 2020-03-07 LAB — PTH, INTACT AND CALCIUM
Calcium: 10 mg/dL (ref 8.7–10.3)
PTH: 21 pg/mL (ref 15–65)

## 2020-03-07 LAB — CBC
Hematocrit: 48.3 % — ABNORMAL HIGH (ref 34.0–46.6)
Hemoglobin: 15.9 g/dL (ref 11.1–15.9)
MCH: 32.8 pg (ref 26.6–33.0)
MCHC: 32.9 g/dL (ref 31.5–35.7)
MCV: 100 fL — ABNORMAL HIGH (ref 79–97)
Platelets: 228 10*3/uL (ref 150–450)
RBC: 4.85 x10E6/uL (ref 3.77–5.28)
RDW: 12.3 % (ref 11.7–15.4)
WBC: 6.8 10*3/uL (ref 3.4–10.8)

## 2020-03-07 LAB — HEMOGLOBIN A1C
Est. average glucose Bld gHb Est-mCnc: 128 mg/dL
Hgb A1c MFr Bld: 6.1 % — ABNORMAL HIGH (ref 4.8–5.6)

## 2020-03-07 LAB — TSH: TSH: 1.53 u[IU]/mL (ref 0.450–4.500)

## 2020-03-07 LAB — PHOSPHORUS: Phosphorus: 3.9 mg/dL (ref 3.0–4.3)

## 2020-03-07 NOTE — Telephone Encounter (Signed)
The pt was notified that Dr Baird Cancer wants the pt to return in 2 wks for a nurse visit to recheck her urine because it had a trace of blood.

## 2020-03-07 NOTE — Telephone Encounter (Signed)
I left the pt a message to call the office back so that I can let her know that Dr. Baird Cancer wants the pt to return in 2 weeks for a nurse visit to recheck her urine because it was a small amount of blood in her urine.

## 2020-03-07 NOTE — Telephone Encounter (Signed)
The pt called and wanted to let the office know that she spoke with [facility] and they are shipping her porlia to the office on 03/12/2020.

## 2020-03-09 LAB — LIPID PANEL
Chol/HDL Ratio: 4.2 ratio (ref 0.0–4.4)
Cholesterol, Total: 195 mg/dL (ref 100–199)
HDL: 46 mg/dL (ref >39)
LDL Chol Calc (NIH): 131 mg/dL — ABNORMAL HIGH (ref 0–99)
Triglycerides: 102 mg/dL (ref 0–149)
VLDL Cholesterol Cal: 18 mg/dL (ref 5–40)

## 2020-03-09 LAB — SPECIMEN STATUS REPORT

## 2020-03-12 ENCOUNTER — Ambulatory Visit (INDEPENDENT_AMBULATORY_CARE_PROVIDER_SITE_OTHER): Payer: Medicare HMO

## 2020-03-12 ENCOUNTER — Telehealth: Payer: Self-pay

## 2020-03-12 ENCOUNTER — Other Ambulatory Visit: Payer: Self-pay

## 2020-03-12 VITALS — BP 110/85 | HR 75 | Temp 98.1°F | Ht 64.0 in | Wt 202.0 lb

## 2020-03-12 DIAGNOSIS — M8580 Other specified disorders of bone density and structure, unspecified site: Secondary | ICD-10-CM | POA: Diagnosis not present

## 2020-03-12 DIAGNOSIS — Z23 Encounter for immunization: Secondary | ICD-10-CM | POA: Diagnosis not present

## 2020-03-12 MED ORDER — DENOSUMAB 60 MG/ML ~~LOC~~ SOSY
60.0000 mg | PREFILLED_SYRINGE | Freq: Once | SUBCUTANEOUS | Status: AC
  Administered 2020-03-12: 60 mg via SUBCUTANEOUS

## 2020-03-12 NOTE — Progress Notes (Signed)
Patient is here today to receive pneumonia vaccine. Patient also received her prolia injection today.

## 2020-03-12 NOTE — Telephone Encounter (Signed)
-----   Message from Glendale Chard, MD sent at 03/10/2020  7:55 PM EST ----- Here are your lab results:  Your cholesterol has gone up almost 40 points - your LDL, bad cholesterol is 131 - should be less than 100. Be sure to increase exercise as we discussed during our visit. I also suggest cutting out fried foods.   Your MCV is elevated, sometimes this is associated with alcohol use. Do you drink wine? Any other alcohol? Be sure to stay well hydrated.   Your hba1c is 6.1, this is in prediabetes range. Be sure to cut out sugary beverages. I would also cut back on your sweet treats. Exercise will help this as well.   Your TSH is normal, but your free T4 is elevated. How are you taking your thyroid meds?  Please let me know if you have any questions or concerns. Stay safe!   Sincerely,    Robyn N. Baird Cancer, MD

## 2020-03-12 NOTE — Telephone Encounter (Signed)
I left the pt a message to call back for her lab results or log on to her mychart account to view and answer the questions.

## 2020-03-19 ENCOUNTER — Ambulatory Visit: Payer: Self-pay

## 2020-03-26 ENCOUNTER — Other Ambulatory Visit: Payer: Self-pay

## 2020-03-26 ENCOUNTER — Ambulatory Visit (INDEPENDENT_AMBULATORY_CARE_PROVIDER_SITE_OTHER): Payer: Medicare HMO

## 2020-03-26 VITALS — BP 116/74 | HR 83 | Temp 97.9°F | Ht 62.8 in | Wt 202.2 lb

## 2020-03-26 DIAGNOSIS — R319 Hematuria, unspecified: Secondary | ICD-10-CM | POA: Diagnosis not present

## 2020-03-26 DIAGNOSIS — R3129 Other microscopic hematuria: Secondary | ICD-10-CM

## 2020-03-26 LAB — POCT URINALYSIS DIPSTICK
Bilirubin, UA: NEGATIVE
Glucose, UA: NEGATIVE
Ketones, UA: NEGATIVE
Nitrite, UA: NEGATIVE
Protein, UA: POSITIVE — AB
Spec Grav, UA: 1.02 (ref 1.010–1.025)
Urobilinogen, UA: 0.2 E.U./dL
pH, UA: 7 (ref 5.0–8.0)

## 2020-03-26 NOTE — Progress Notes (Signed)
Pt here today for a recheck of her urine  Taffany Heiser is here for a rechck of her urine hematuria results today- Blood: moderate Pro: 30 Leu: Trace  Per RS: okay, ask if she is willing to have renal u/s make sure she has not had one before if she hasnt, pls schedule renal u/s dx: microscopic hematuria  she said not that she recalls, I'll schedule  RS: oh, check in her chart to be sure chart notes, imaging  Looks like she had one done in 2017  RS: i just read it, i do want to repeat it. let her know before she had kidney stone, she may still have issue   Pt ok with Renal US order has been placed

## 2020-04-08 ENCOUNTER — Ambulatory Visit
Admission: RE | Admit: 2020-04-08 | Discharge: 2020-04-08 | Disposition: A | Discharge status: Home / Self Care | Payer: Medicare HMO | Source: Ambulatory Visit | Attending: Internal Medicine | Admitting: Internal Medicine

## 2020-04-08 DIAGNOSIS — R3129 Other microscopic hematuria: Secondary | ICD-10-CM

## 2020-05-02 ENCOUNTER — Other Ambulatory Visit: Payer: Self-pay | Admitting: Internal Medicine

## 2020-05-29 ENCOUNTER — Other Ambulatory Visit: Payer: Self-pay

## 2020-05-29 ENCOUNTER — Ambulatory Visit (INDEPENDENT_AMBULATORY_CARE_PROVIDER_SITE_OTHER): Payer: Medicare HMO | Admitting: Podiatry

## 2020-05-29 DIAGNOSIS — M79675 Pain in left toe(s): Secondary | ICD-10-CM | POA: Diagnosis not present

## 2020-05-29 DIAGNOSIS — B351 Tinea unguium: Secondary | ICD-10-CM | POA: Diagnosis not present

## 2020-05-29 DIAGNOSIS — M79674 Pain in right toe(s): Secondary | ICD-10-CM

## 2020-05-29 MED ORDER — TERBINAFINE HCL 250 MG PO TABS
250.0000 mg | ORAL_TABLET | Freq: Every day | ORAL | 0 refills | Status: DC
Start: 2020-05-29 — End: 2021-12-02

## 2020-05-29 NOTE — Progress Notes (Signed)
   HPI: 70 y.o. female presenting today as a new patient for follow-up evaluation of onychomycosis to the bilateral great toes.  She states that she continues to have some thickening and discoloration of the nail plates.  She presents for further treatment and evaluation  Past Medical History:  Diagnosis Date  . Abnormal mammogram   . Brain aneurysm 2005  . GERD (gastroesophageal reflux disease)   . Hypertension   . Hypothyroidism   . Osteoporosis   . Thyroid disease      Physical Exam: General: The patient is alert and oriented x3 in no acute distress.  Dermatology: Skin is warm, dry and supple bilateral lower extremities. Negative for open lesions or macerations.  Hyperkeratotic, dystrophic, discolored nails noted to the right foot, most significantly the right hallux.  Findings consistent with onychomycosis.  The base of the nails actually appear to be growing out nice and healthy.  It appears that the Lamisil is working and he correcting the onychomycosis of the toenails  Vascular: Palpable pedal pulses bilaterally. No edema or erythema noted. Capillary refill within normal limits.  Neurological: Epicritic and protective threshold grossly intact bilaterally.   Musculoskeletal Exam: Range of motion within normal limits to all pedal and ankle joints bilateral. Muscle strength 5/5 in all groups bilateral.   Assessment: 1.  Onychomycosis of toenails right foot   Plan of Care:  1. Patient evaluated. 2.  Overall the patient is improved significantly.  The base of the nail appears to be growing out appropriately with no evidence of onychomycosis.  The distal half of the nail however continues to be thickened and dystrophic 3.  The patient tolerated the oral Lamisil just fine.  No complications.  Refill provided today Lamisil 2 and 50 mg #90 4.  Return to clinic as needed  *Referred by [place] Hill. She is the grandmother to Asia's twin boys.      Edrick Kins, DPM [facility]  Dr. Edrick Kins, DPM    [address], [place] [address]                Office [phone]  Fax [phone]

## 2020-06-04 ENCOUNTER — Other Ambulatory Visit: Payer: Self-pay | Admitting: Internal Medicine

## 2020-06-13 ENCOUNTER — Emergency Department (HOSPITAL_COMMUNITY)
Admission: EM | Admit: 2020-06-13 | Discharge: 2020-06-13 | Disposition: A | Discharge status: Home / Self Care | Payer: Medicare HMO | Attending: Emergency Medicine | Admitting: Emergency Medicine

## 2020-06-13 ENCOUNTER — Emergency Department (HOSPITAL_COMMUNITY): Payer: Medicare HMO

## 2020-06-13 ENCOUNTER — Other Ambulatory Visit: Payer: Self-pay

## 2020-06-13 ENCOUNTER — Encounter (HOSPITAL_COMMUNITY): Payer: Self-pay | Admitting: Emergency Medicine

## 2020-06-13 DIAGNOSIS — I10 Essential (primary) hypertension: Secondary | ICD-10-CM | POA: Insufficient documentation

## 2020-06-13 DIAGNOSIS — W01198A Fall on same level from slipping, tripping and stumbling with subsequent striking against other object, initial encounter: Secondary | ICD-10-CM | POA: Diagnosis not present

## 2020-06-13 DIAGNOSIS — Z87891 Personal history of nicotine dependence: Secondary | ICD-10-CM | POA: Diagnosis not present

## 2020-06-13 DIAGNOSIS — W19XXXA Unspecified fall, initial encounter: Secondary | ICD-10-CM

## 2020-06-13 DIAGNOSIS — S0121XA Laceration without foreign body of nose, initial encounter: Secondary | ICD-10-CM | POA: Diagnosis not present

## 2020-06-13 DIAGNOSIS — E039 Hypothyroidism, unspecified: Secondary | ICD-10-CM | POA: Insufficient documentation

## 2020-06-13 DIAGNOSIS — S022XXB Fracture of nasal bones, initial encounter for open fracture: Secondary | ICD-10-CM

## 2020-06-13 DIAGNOSIS — Z79899 Other long term (current) drug therapy: Secondary | ICD-10-CM | POA: Diagnosis not present

## 2020-06-13 DIAGNOSIS — S0992XA Unspecified injury of nose, initial encounter: Secondary | ICD-10-CM | POA: Diagnosis present

## 2020-06-13 LAB — BASIC METABOLIC PANEL
Anion gap: 9 (ref 5–15)
BUN: 19 mg/dL (ref 8–23)
CO2: 24 mmol/L (ref 22–32)
Calcium: 9 mg/dL (ref 8.9–10.3)
Chloride: 107 mmol/L (ref 98–111)
Creatinine, Ser: 0.95 mg/dL (ref 0.44–1.00)
GFR, Estimated: >60 mL/min (ref >60)
Glucose, Bld: 98 mg/dL (ref 70–99)
Potassium: 4 mmol/L (ref 3.5–5.1)
Sodium: 140 mmol/L (ref 135–145)

## 2020-06-13 LAB — CBC WITH DIFFERENTIAL/PLATELET
Abs Immature Granulocytes: 0.03 10*3/uL (ref 0.00–0.07)
Basophils Absolute: 0.1 10*3/uL (ref 0.0–0.1)
Basophils Relative: 1 %
Eosinophils Absolute: 0.1 10*3/uL (ref 0.0–0.5)
Eosinophils Relative: 1 %
HCT: 46 % (ref 36.0–46.0)
Hemoglobin: 15.1 g/dL — ABNORMAL HIGH (ref 12.0–15.0)
Immature Granulocytes: 0 %
Lymphocytes Relative: 14 %
Lymphs Abs: 1.5 10*3/uL (ref 0.7–4.0)
MCH: 33.4 pg (ref 26.0–34.0)
MCHC: 32.8 g/dL (ref 30.0–36.0)
MCV: 101.8 fL — ABNORMAL HIGH (ref 80.0–100.0)
Monocytes Absolute: 0.8 10*3/uL (ref 0.1–1.0)
Monocytes Relative: 7 %
Neutro Abs: 8.5 10*3/uL — ABNORMAL HIGH (ref 1.7–7.7)
Neutrophils Relative %: 77 %
Platelets: 227 10*3/uL (ref 150–400)
RBC: 4.52 MIL/uL (ref 3.87–5.11)
RDW: 13.6 % (ref 11.5–15.5)
WBC: 10.9 10*3/uL — ABNORMAL HIGH (ref 4.0–10.5)
nRBC: 0 % (ref 0.0–0.2)

## 2020-06-13 MED ORDER — CEPHALEXIN 500 MG PO CAPS: 500.0000 mg | ORAL_CAPSULE | Freq: Four times a day (QID) | ORAL | 0 refills | Status: DC

## 2020-06-13 MED ORDER — HYDROCODONE-ACETAMINOPHEN 5-325 MG PO TABS: 1.0000 | ORAL_TABLET | ORAL | 0 refills | Status: DC | PRN

## 2020-06-13 MED ORDER — ONDANSETRON HCL 4 MG/2ML IJ SOLN
4.0000 mg | Freq: Once | INTRAMUSCULAR | Status: AC
  Administered 2020-06-13: 4 mg via INTRAVENOUS
  Filled 2020-06-13: qty 2

## 2020-06-13 MED ORDER — MORPHINE SULFATE (PF) 4 MG/ML IV SOLN
4.0000 mg | Freq: Once | INTRAVENOUS | Status: AC
  Administered 2020-06-13: 4 mg via INTRAVENOUS
  Filled 2020-06-13: qty 1

## 2020-06-13 MED ORDER — LIDOCAINE-EPINEPHRINE 1 %-1:100000 IJ SOLN
10.0000 mL | Freq: Once | INTRAMUSCULAR | Status: AC
  Administered 2020-06-13: 10 mL
  Filled 2020-06-13: qty 1

## 2020-06-13 MED ORDER — BUPIVACAINE HCL 0.5 % IJ SOLN
10.0000 mL | Freq: Once | INTRAMUSCULAR | Status: AC
  Administered 2020-06-13: 10 mL
  Filled 2020-06-13: qty 10

## 2020-06-13 MED ORDER — ONDANSETRON 4 MG PO TBDP: 4.0000 mg | ORAL_TABLET | Freq: Three times a day (TID) | ORAL | 0 refills | Status: DC | PRN

## 2020-06-13 NOTE — ED Triage Notes (Signed)
Patient with history of vertigo, BIB [facility] after losing balance in front of a [employer] and falling onto her face on the concrete. Hematoma to forehead, 2cm laceration on nose, laceration to philtrum. Denies LOC. Arrives in C-collar from EMS. Patient denies any other complaints.

## 2020-06-13 NOTE — Discharge Instructions (Addendum)
  Elevate the injured area to reduce pain and swelling. May apply cold packs to the injured area. Antiinflammatory medications: Take 600 mg of ibuprofen every 6 hours or 440 mg (over the counter dose) to 500 mg (prescription dose) of naproxen every 12 hours for the next 3 days. After this time, these medications may be used as needed for pain. Take these medications with food to avoid upset stomach. Choose only one of these medications, do not take them together. Acetaminophen (generic for Tylenol): Should you continue to have additional pain while taking the ibuprofen or naproxen, you may add in acetaminophen as needed. Your daily total maximum amount of acetaminophen from all sources should be limited to 4000mg /day for persons without liver problems, or 2000mg /day for those with liver problems. Vicodin: May take Vicodin (hydrocodone-acetaminophen) as needed for severe pain.   Do not drive or perform other dangerous activities while taking this medication as it can cause drowsiness as well as changes in reaction time and judgement.   Please note that each pill of Vicodin contains 325 mg of acetaminophen (generic for Tylenol) and the above dosage limits apply. Nausea/vomiting: Use the ondansetron (generic for Zofran) for nausea or vomiting.  This medication may not prevent all vomiting or nausea, but can help facilitate better hydration. Things that can help with nausea/vomiting also include peppermint/menthol candies, vitamin B12, and ginger.  Follow-up with the ear nose and throat specialist for further management of this issue.  Follow-up should occur within the next 5 days.  Call to make an appointment.  Return the emergency department for weakness, numbness, passing out, persistent vomiting, or any other major concerns.

## 2020-06-13 NOTE — ED Triage Notes (Cosign Needed)
Emergency Medicine Provider Triage Evaluation Note  Rachel [name] , a 70 y.o. female  was evaluated in triage.  Pt complains of stepping up onto a curb at a drugstore which caused her to lose her balance and fall.  She reports pain in her head, face, and neck along with her right knee.  She denies feeling lightheaded or dizzy before hand and did not have loss of consciousness.  She does not take any blood thinning medications.  She currently denies any pains in her chest, back, abdomen, bilateral arms and left leg.     Review of Systems  Positive: Laceration, facial swelling Negative: LOC, blood thinner, dizziness, light headed, fevers, vomiting  Physical Exam  BP (!) 143/85 (BP Location: Right Arm)   Pulse 69   Temp (!) 97.4 F (36.3 C) (Oral)   Resp 18   SpO2 97%  Gen:   Awake, no distress   HEENT:  Significant contusion across midline forehead with abrasion.  There is laceration across the bridge of the nose along with wounds and bleeding on the upper lip.  These were not fully examined due to bleeding and pain. Resp:  Normal effort  Cardiac:  Normal rate  Abd:   Nondistended, nontender  MSK:   Moves extremities without difficulty, mild tenderness to palpation over right knee along anterior patella, is able to fully extend right leg and hold leg extended.  C-collar in place Neuro:  she is awake and alert, answers questions appropriately without difficulty.  Not slurred.  Medical Decision Making  Medically screening exam initiated at 1:23 PM.  Appropriate orders placed.  Rachel [name] was informed that the remainder of the evaluation will be completed by another provider, this initial triage assessment does not replace that evaluation, and the importance of remaining in the ED until their evaluation is complete.  Patient reports a mechanical losing her balance after stepping up on a curb without syncopal event or prodromal symptoms.  She did not have loss of consciousness.  She is  not anticoagulated however has significant contusion over her anterior forehead along with significant facial trauma.  C-collar is in place.  CT head, face, neck ordered along with x-rays of the right knee.  She has minimal oozing at this time from her facial wounds.  Clinical Impression  Fall, facial trauma, neck pain, headache   Lorin Glass, PA-C 06/13/20 1328

## 2020-06-13 NOTE — ED Provider Notes (Signed)
[facility] EMERGENCY DEPARTMENT Provider Note   CSN: [id number] Arrival date & time: 06/13/20  1305     History Chief Complaint  Patient presents with  . Fall    Rachel [name] is a 70 y.o. female.  HPI      Rachel [name] is a 70 y.o. female, with a history of HTN, obesity, osteoporosis, GERD, presenting to the ED with injuries from a fall that occurred shortly prior to arrival. Patient states she was walking in a parking lot, tripped, and fell, striking her face on the curb. She complains of pain to her face, primarily to her nose, as well as right knee pain.  Denies anticoagulation.  Last tetanus vaccination updated about a year ago. Denies LOC, neck/back pain, chest pain, shortness of breath, nausea/vomiting, numbness, weakness, or any other complaints.  Past Medical History:  Diagnosis Date  . Abnormal mammogram   . Brain aneurysm 2005  . GERD (gastroesophageal reflux disease)   . Hypertension   . Hypothyroidism   . Osteoporosis   . Thyroid disease     Patient Active Problem List   Diagnosis Date Noted  . Class 2 severe obesity due to excess calories with serious comorbidity and body mass index (BMI) of 36.0 to 36.9 in adult [facility]) 02/25/2018  . Primary hypothyroidism 02/25/2018  . Other abnormal glucose 02/25/2018  . Hypertensive nephropathy 02/25/2018  . Chronic renal disease, stage II 02/25/2018  . Postinflammatory pulmonary fibrosis ([place]) 11/09/2016  . Morbid obesity due to excess calories ([place]) 12/17/2015  . Small bowel obstruction ([place]) 11/19/2015  . Nausea with vomiting 11/18/2015  . S/P vaginal hysterectomy 11/12/2015  . Essential hypertension 04/03/2013    Past Surgical History:  Procedure Laterality Date  . BRAIN SURGERY     2005    DR CABBELL   . COLONOSCOPY    . LAPAROSCOPIC BILATERAL SALPINGO OOPHERECTOMY Bilateral 11/12/2015   Procedure: LAPAROSCOPIC BILATERAL SALPINGO OOPHORECTOMY;  Surgeon: Everett Graff, MD;   Location: [facility];  Service: Gynecology;  Laterality: Bilateral;  . LAPAROSCOPY N/A 11/22/2015   Procedure: LAPAROSCOPY DIAGNOSTIC with repair of serosal tear;  Surgeon: Everett Graff, MD;  Location: [facility];  Service: Gynecology;  Laterality: N/A;  . RADIOACTIVE SEED GUIDED EXCISIONAL BREAST BIOPSY Left 09/26/2015   Procedure: LEFT RADIOACTIVE SEED GUIDED EXCISIONAL BREAST BIOPSY;  Surgeon: Stark Klein, MD;  Location: [facility];  Service: General;  Laterality: Left;  . RADIOACTIVE SEED GUIDED EXCISIONAL BREAST BIOPSY Left 02/20/2020   Procedure: LEFT RADIOACTIVE SEED GUIDED EXCISIONAL BREAST BIOPSY;  Surgeon: Stark Klein, MD;  Location: [facility];  Service: General;  Laterality: Left;  RNFA  . WISDOM TOOTH EXTRACTION       OB History   No obstetric history on file.     Family History  Problem Relation Age of Onset  . Cancer Mother        Breast   . Hypertension Mother   . Cancer Father        Prostate  . Hypertension Father   . Cancer Sister        Breast    Social History   Tobacco Use  . Smoking status: Former Smoker    Packs/day: 1.00    Years: 25.00    Pack years: 25.00    Types: Cigarettes    Quit date: 04/02/2004    Years since quitting: 16.2  . Smokeless tobacco: Never Used  Vaping Use  . Vaping Use: Never used  Substance Use  Topics  . Alcohol use: No  . Drug use: No    Home Medications Prior to Admission medications   Medication Sig Start Date End Date Taking? Authorizing Provider  HYDROcodone-acetaminophen (NORCO/VICODIN) 5-325 MG tablet Take 1 tablet by mouth every 4 (four) hours as needed for severe pain. 06/13/20  Yes Dakayla Disanti C, PA-C  ondansetron (ZOFRAN ODT) 4 MG disintegrating tablet Take 1 tablet (4 mg total) by mouth every 8 (eight) hours as needed for nausea or vomiting. 06/13/20  Yes Jamorris Ndiaye C, PA-C  calcium carbonate (TUMS - DOSED IN MG ELEMENTAL CALCIUM) 500 MG chewable tablet Chew 4 tablets by mouth 2 (two) times daily as needed  for indigestion or heartburn.    [provider]  cephALEXin (KEFLEX) 500 MG capsule Take 1 capsule (500 mg total) by mouth 4 (four) times daily. 06/13/20   Marvene Strohm C, PA-C  Cholecalciferol (VITAMIN D3) 5000 units CAPS Take 5,000 Units by mouth daily.     [provider]  COMBIGAN 0.2-0.5 % ophthalmic solution Place 1 drop into the right eye 2 (two) times daily. 08/16/15   [provider]  denosumab (PROLIA) 60 MG/ML SOSY injection Inject 60 mg into the skin every 6 (six) months. 03/06/20   Glendale Chard, MD  pantoprazole (PROTONIX) 40 MG tablet TAKE 1 TABLET BY MOUTH ONCE A DAY 06/05/20   Glendale Chard, MD  simvastatin (ZOCOR) 40 MG tablet TAKE 1 TABLET BY MOUTH EVERY DAY 02/26/20   Glendale Chard, MD  solifenacin (VESICARE) 5 MG tablet Take 5 mg by mouth daily.    [provider]  SYNTHROID 88 MCG tablet TAKE 1 TABLET BY MOUTH DAILY BEFORE BREAKFAST. 1 TABLET ON MON - FRI AND 1/2 TAB SAT-SUN 05/02/20   Glendale Chard, MD  temazepam (RESTORIL) 15 MG capsule TAKE 1 CAPSULE BY MOUTH EVERY DAY AT BEDTIME AS NEEDED 03/06/20   Glendale Chard, MD  terbinafine (LAMISIL) 250 MG tablet Take 1 tablet (250 mg total) by mouth daily. 05/29/20   Edrick Kins, DPM  valsartan-hydrochlorothiazide (DIOVAN-HCT) 80-12.5 MG tablet TAKE 1 TABLET BY MOUTH EVERY DAY 09/11/19   Glendale Chard, MD    Allergies    Dilantin [phenytoin sodium extended]  Review of Systems   Review of Systems  Constitutional: Negative for diaphoresis.  Respiratory: Negative for shortness of breath.   Cardiovascular: Negative for chest pain.  Gastrointestinal: Negative for abdominal pain, nausea and vomiting.  Musculoskeletal: Negative for back pain and neck pain.  Skin: Positive for wound.  Neurological: Positive for headaches. Negative for syncope, weakness and numbness.  All other systems reviewed and are negative.   Physical Exam Updated Vital Signs BP 138/87   Pulse 72   Temp (!) 97.4 F (36.3  C) (Oral)   Resp 20   Ht 5\' 4"  (1.626 m)   Wt 90.7 kg   SpO2 98%   BMI 34.33 kg/m   Physical Exam Vitals and nursing note reviewed.  Constitutional:      General: She is not in acute distress.    Appearance: She is well-developed. She is not diaphoretic.  HENT:     Head: Normocephalic.     Comments: Tenderness, swelling, bruising to the central forehead without noted deformity.    Nose:     Comments: Swelling and tenderness to the nose.  Unable to fully assess if septal hematomas are present due to swelling. Approximately 2 cm laceration to the bridge of the nose, as shown. There is also a complex laceration,  approximately 2-3 cm, at the inferior aspect of the nose, allowing mobility to the septum.    Mouth/Throat:     Mouth: Mucous membranes are moist.     Comments: Swelling to the lips and central face.  There is some limited mouth opening due to facial swelling, but airway is intact. No noted intraoral swelling or hemorrhage.  Dentition seems to be intact, patient agrees. Eyes:     Extraocular Movements: Extraocular movements intact.     Conjunctiva/sclera: Conjunctivae normal.     Pupils: Pupils are equal, round, and reactive to light.     Comments: No periorbital deformity or injuries noted.  No pain with EOMs.  Cardiovascular:     Rate and Rhythm: Normal rate and regular rhythm.     Pulses: Normal pulses.          Radial pulses are 2+ on the right side and 2+ on the left side.       Posterior tibial pulses are 2+ on the right side and 2+ on the left side.     Heart sounds: Normal heart sounds.     Comments: Tactile temperature in the extremities appropriate and equal bilaterally. Pulmonary:     Effort: Pulmonary effort is normal. No respiratory distress.     Breath sounds: Normal breath sounds.  Abdominal:     Palpations: Abdomen is soft.     Tenderness: There is no abdominal tenderness. There is no guarding.  Musculoskeletal:     Cervical back: Normal range of  motion and neck supple. No rigidity or tenderness.     Comments: Some tenderness and bruising to the anterior right knee without deformity, swelling, or instability.  Full range of motion in the knee and each of the joints of the upper and lower extremities without noted difficulty or pain. Normal motor function intact in all extremities. No midline spinal tenderness.   Skin:    General: Skin is warm and dry.  Neurological:     Mental Status: She is alert and oriented to person, place, and time.     Comments: No acute cognitive deficit noted. Grip strength equal bilaterally. Strength 5/5 and equal in the upper and lower extremities. Sensation light touch grossly intact in the extremities.  Psychiatric:        Mood and Affect: Mood and affect normal.        Speech: Speech normal.        Behavior: Behavior normal.                             ED Results / Procedures / Treatments   Labs (all labs ordered are listed, but only abnormal results are displayed) Labs Reviewed  CBC WITH DIFFERENTIAL/PLATELET - Abnormal; Notable for the following components:      Result Value   WBC 10.9 (*)    Hemoglobin 15.1 (*)    MCV 101.8 (*)    Neutro Abs 8.5 (*)    All other components within normal limits  BASIC METABOLIC PANEL    EKG None  Radiology CT Head Wo Contrast  Result Date: 06/13/2020 CLINICAL DATA:  Fall with facial trauma EXAM: CT HEAD WITHOUT CONTRAST CT MAXILLOFACIAL WITHOUT CONTRAST CT CERVICAL SPINE WITHOUT CONTRAST TECHNIQUE: Multidetector CT imaging of the head, cervical spine, and maxillofacial structures were performed using the standard protocol without intravenous contrast. Multiplanar CT image reconstructions of the cervical spine and maxillofacial structures were also generated. COMPARISON:  None.  FINDINGS: CT HEAD FINDINGS Brain: There is no acute intracranial hemorrhage, mass effect, or edema. Right frontotemporal encephalomalacia. There is gliosis  along a probable prior right frontal approach catheter tract. No acute appearing loss of gray-white differentiation. Vascular: Anterior communicating artery aneurysm clipping. Skull: Right pterional craniotomy. Other: Mastoid air cells are clear. CT MAXILLOFACIAL FINDINGS Osseous: Comminuted and displaced fractures of the left nasal bone. Fracture of the anterior superior nasal septum. Nasal septum is deviated to the left. Opacification of anterior superior nasal cavity. Orbits: No intraorbital hematoma. Sinuses: Minor mucosal thickening. Soft tissues: Ventral frontal scalp hematoma. Paranasal soft tissue swelling. CT CERVICAL SPINE FINDINGS Alignment: No significant listhesis. Skull base and vertebrae: No acute fracture. Vertebral body heights are maintained. Soft tissues and spinal canal: No prevertebral fluid or swelling. No visible canal hematoma. Disc levels:  Minor degenerative changes of the cervical spine. Upper chest: No apical lung mass. Other: Mild calcified plaque at the common carotid bifurcations. IMPRESSION: No evidence of acute intracranial injury. Chronic findings detailed above. Comminuted and displaced acute fractures of the left nasal bone. Acute fracture of the anterior superior nasal septum. No acute cervical spine fracture. Electronically Signed   By: Macy Mis M.D.   On: 06/13/2020 14:07   CT Cervical Spine Wo Contrast  Result Date: 06/13/2020 CLINICAL DATA:  Fall with facial trauma EXAM: CT HEAD WITHOUT CONTRAST CT MAXILLOFACIAL WITHOUT CONTRAST CT CERVICAL SPINE WITHOUT CONTRAST TECHNIQUE: Multidetector CT imaging of the head, cervical spine, and maxillofacial structures were performed using the standard protocol without intravenous contrast. Multiplanar CT image reconstructions of the cervical spine and maxillofacial structures were also generated. COMPARISON:  None. FINDINGS: CT HEAD FINDINGS Brain: There is no acute intracranial hemorrhage, mass effect, or edema. Right  frontotemporal encephalomalacia. There is gliosis along a probable prior right frontal approach catheter tract. No acute appearing loss of gray-white differentiation. Vascular: Anterior communicating artery aneurysm clipping. Skull: Right pterional craniotomy. Other: Mastoid air cells are clear. CT MAXILLOFACIAL FINDINGS Osseous: Comminuted and displaced fractures of the left nasal bone. Fracture of the anterior superior nasal septum. Nasal septum is deviated to the left. Opacification of anterior superior nasal cavity. Orbits: No intraorbital hematoma. Sinuses: Minor mucosal thickening. Soft tissues: Ventral frontal scalp hematoma. Paranasal soft tissue swelling. CT CERVICAL SPINE FINDINGS Alignment: No significant listhesis. Skull base and vertebrae: No acute fracture. Vertebral body heights are maintained. Soft tissues and spinal canal: No prevertebral fluid or swelling. No visible canal hematoma. Disc levels:  Minor degenerative changes of the cervical spine. Upper chest: No apical lung mass. Other: Mild calcified plaque at the common carotid bifurcations. IMPRESSION: No evidence of acute intracranial injury. Chronic findings detailed above. Comminuted and displaced acute fractures of the left nasal bone. Acute fracture of the anterior superior nasal septum. No acute cervical spine fracture. Electronically Signed   By: Macy Mis M.D.   On: 06/13/2020 14:07   DG Knee Complete 4 Views Right  Result Date: 06/13/2020 CLINICAL DATA:  70 year old female with fall and pain EXAM: RIGHT KNEE - COMPLETE 4+ VIEW COMPARISON:  None. FINDINGS: No evidence of fracture, dislocation, or joint effusion. No evidence of arthropathy or other focal bone abnormality. Soft tissues are unremarkable. IMPRESSION: Negative. Electronically Signed   By: Corrie Mckusick D.O.   On: 06/13/2020 14:06   CT Maxillofacial WO CM  Result Date: 06/13/2020 CLINICAL DATA:  Fall with facial trauma EXAM: CT HEAD WITHOUT CONTRAST CT  MAXILLOFACIAL WITHOUT CONTRAST CT CERVICAL SPINE WITHOUT CONTRAST TECHNIQUE: Multidetector CT imaging of  the head, cervical spine, and maxillofacial structures were performed using the standard protocol without intravenous contrast. Multiplanar CT image reconstructions of the cervical spine and maxillofacial structures were also generated. COMPARISON:  None. FINDINGS: CT HEAD FINDINGS Brain: There is no acute intracranial hemorrhage, mass effect, or edema. Right frontotemporal encephalomalacia. There is gliosis along a probable prior right frontal approach catheter tract. No acute appearing loss of gray-white differentiation. Vascular: Anterior communicating artery aneurysm clipping. Skull: Right pterional craniotomy. Other: Mastoid air cells are clear. CT MAXILLOFACIAL FINDINGS Osseous: Comminuted and displaced fractures of the left nasal bone. Fracture of the anterior superior nasal septum. Nasal septum is deviated to the left. Opacification of anterior superior nasal cavity. Orbits: No intraorbital hematoma. Sinuses: Minor mucosal thickening. Soft tissues: Ventral frontal scalp hematoma. Paranasal soft tissue swelling. CT CERVICAL SPINE FINDINGS Alignment: No significant listhesis. Skull base and vertebrae: No acute fracture. Vertebral body heights are maintained. Soft tissues and spinal canal: No prevertebral fluid or swelling. No visible canal hematoma. Disc levels:  Minor degenerative changes of the cervical spine. Upper chest: No apical lung mass. Other: Mild calcified plaque at the common carotid bifurcations. IMPRESSION: No evidence of acute intracranial injury. Chronic findings detailed above. Comminuted and displaced acute fractures of the left nasal bone. Acute fracture of the anterior superior nasal septum. No acute cervical spine fracture. Electronically Signed   By: Macy Mis M.D.   On: 06/13/2020 14:07    Procedures .Marland KitchenLaceration Repair  Date/Time: 06/13/2020 7:30 PM Performed by: Lorayne Bender, PA-C Authorized by: Lorayne Bender, PA-C   Consent:    Consent obtained:  Verbal   Consent given by:  Patient   Risks, benefits, and alternatives were discussed: yes     Risks discussed:  Infection, need for additional repair, poor wound healing, poor cosmetic result and pain Universal protocol:    Procedure explained and questions answered to patient or proxy's satisfaction: yes     Patient identity confirmed:  Verbally with patient and provided demographic data Anesthesia:    Anesthesia method:  Local infiltration   Local anesthetic:  Lidocaine 1% WITH epi and bupivacaine 0.5% w/o epi Laceration details:    Location:  Face   Face location:  Nose   Length (cm):  2 Pre-procedure details:    Preparation:  Patient was prepped and draped in usual sterile fashion Exploration:    Hemostasis achieved with:  Epinephrine   Imaging outcome: foreign body not noted     Wound exploration: wound explored through full range of motion   Treatment:    Area cleansed with:  Saline and povidone-iodine   Amount of cleaning:  Extensive   Irrigation solution:  Sterile saline   Irrigation volume:  1000cc   Irrigation method:  Syringe   Debridement:  None   Layers/structures repaired:  Deep subcutaneous Deep subcutaneous:    Suture size:  5-0   Suture material:  Vicryl (Rapide)   Suture technique:  Figure eight   Number of sutures:  5 Repair type:    Repair type:  Intermediate Post-procedure details:    Dressing:  Sterile dressing   Procedure completion:  Tolerated well, no immediate complications .Nerve Block  Date/Time: 06/13/2020 6:58 PM Performed by: Lorayne Bender, PA-C Authorized by: Lorayne Bender, PA-C   Consent:    Consent obtained:  Verbal   Consent given by:  Patient   Risks, benefits, and alternatives were discussed: yes     Risks discussed:  Nerve damage, swelling, unsuccessful block,  pain and bleeding Universal protocol:    Procedure explained and questions answered to  patient or proxy's satisfaction: yes     Patient identity confirmed:  Verbally with patient and provided demographic data Indications:    Indications:  Procedural anesthesia Location:    Body area:  Head   Head nerve blocked: Infraorbital block.   Laterality:  Right Skin anesthesia:    Skin anesthesia method:  Topical application   Topical anesthetic:  Benzocaine gel Procedure details:    Block needle gauge:  25 G   Anesthetic injected:  Bupivacaine 0.5% w/o epi and lidocaine 1% WITH epi   Injection procedure:  Anatomic landmarks identified, anatomic landmarks palpated, incremental injection, introduced needle and negative aspiration for blood Post-procedure details:    Outcome:  Anesthesia achieved   Procedure completion:  Tolerated well, no immediate complications .Marland KitchenLaceration Repair  Date/Time: 06/13/2020 8:29 PM Performed by: Lorayne Bender, PA-C Authorized by: Lorayne Bender, PA-C   Consent:    Consent obtained:  Verbal   Consent given by:  Patient   Risks, benefits, and alternatives were discussed: yes     Risks discussed:  Infection, need for additional repair, poor cosmetic result, pain, poor wound healing, nerve damage and vascular damage Universal protocol:    Patient identity confirmed:  Verbally with patient and provided demographic data Anesthesia:    Anesthesia method:  Nerve block   Block location:  Infraorbital   Block needle gauge:  25 G   Block anesthetic:  Lidocaine 1% WITH epi and bupivacaine 0.5% w/o epi   Block injection procedure:  Anatomic landmarks identified, anatomic landmarks palpated, introduced needle, negative aspiration for blood and incremental injection   Block outcome:  Anesthesia achieved Laceration details:    Location:  Lip   Lip location:  Upper exterior lip   Length (cm):  2.5 Pre-procedure details:    Preparation:  Patient was prepped and draped in usual sterile fashion Exploration:    Hemostasis achieved with:  Epinephrine   Imaging  outcome: foreign body not noted     Wound exploration: wound explored through full range of motion   Treatment:    Area cleansed with:  Povidone-iodine   Amount of cleaning:  Extensive   Irrigation solution:  Sterile saline   Irrigation volume:  1000cc   Irrigation method:  Syringe   Debridement:  Minimal   Layers/structures repaired:  Deep subcutaneous Deep subcutaneous:    Suture size:  5-0   Suture material:  Vicryl (Rapide)   Suture technique:  Figure eight   Number of sutures:  4 Skin repair:    Repair method:  Sutures   Suture size:  5-0   Wound skin closure material used: Vicryl rapide.   Suture technique:  Simple interrupted   Number of sutures:  5 Approximation:    Approximation:  Close Repair type:    Repair type:  Complex Post-procedure details:    Dressing:  Open (no dressing)   Procedure completion:  Tolerated well, no immediate complications     Medications Ordered in ED Medications  morphine 4 MG/ML injection 4 mg (4 mg Intravenous Given 06/13/20 1753)  ondansetron (ZOFRAN) injection 4 mg (4 mg Intravenous Given 06/13/20 1753)  bupivacaine (MARCAINE) 0.5 % (with pres) injection 10 mL (10 mLs Infiltration Given 06/13/20 2049)  lidocaine-EPINEPHrine (XYLOCAINE W/EPI) 1 %-1:100000 (with pres) injection 10 mL (10 mLs Infiltration Given 06/13/20 2049)    ED Course  I have reviewed the triage vital signs and the nursing  notes.  Pertinent labs & imaging results that were available during my care of the patient were reviewed by me and considered in my medical decision making (see chart for details).  Clinical Course as of 06/14/20 1704  Thu Jun 13, 2020  1709 Spoke with Dr. Constance Holster, ENT.  We discussed the patient's injuries and presentation, as well as imaging abnormalities. Recommends closure of lacerations here in the ED, addition of antibiotic such as Keflex, follow-up in the office within 5 days. [SJ]    Clinical Course User Index [SJ] Onalee Steinbach, Helane Gunther, PA-C   MDM  Rules/Calculators/A&P                          Patient presents for evaluation following a mechanical fall. Normal mental status.  No focal neurologic deficits. I personally reviewed and interpreted the patient's labs and imaging studies.  Comminuted nasal fractures. Wounds repaired at the bedside.  Patient to follow-up with ENT in the office. The patient was given instructions for home care as well as return precautions. Patient voices understanding of these instructions, accepts the plan, and is comfortable with discharge.    Findings and plan of care discussed with attending physician, Isla Pence, MD. Dr. Gilford Raid personally evaluated and examined this patient.  Final Clinical Impression(s) / ED Diagnoses Final diagnoses:  Fall, initial encounter  Open fracture of nasal bone, initial encounter    Rx / DC Orders ED Discharge Orders         Ordered    cephALEXin (KEFLEX) 500 MG capsule  4 times daily,   Status:  Discontinued        06/13/20 1907    HYDROcodone-acetaminophen (NORCO/VICODIN) 5-325 MG tablet  Every 4 hours PRN        06/13/20 2025    ondansetron (ZOFRAN ODT) 4 MG disintegrating tablet  Every 8 hours PRN        06/13/20 2025    cephALEXin (KEFLEX) 500 MG capsule  4 times daily        06/13/20 2025           Layla Maw 06/14/20 [place], Julie, MD 06/18/20 [phone]

## 2020-06-16 ENCOUNTER — Other Ambulatory Visit: Payer: Self-pay | Admitting: Internal Medicine

## 2020-06-16 DIAGNOSIS — I10 Essential (primary) hypertension: Secondary | ICD-10-CM

## 2020-06-20 ENCOUNTER — Other Ambulatory Visit (HOSPITAL_COMMUNITY)
Admission: RE | Admit: 2020-06-20 | Discharge: 2020-06-20 | Disposition: A | Discharge status: Home / Self Care | Payer: Medicare HMO | Source: Ambulatory Visit | Attending: Otolaryngology | Admitting: Otolaryngology

## 2020-06-20 ENCOUNTER — Encounter (HOSPITAL_COMMUNITY): Payer: Self-pay | Admitting: Otolaryngology

## 2020-06-20 ENCOUNTER — Other Ambulatory Visit: Payer: Self-pay

## 2020-06-20 DIAGNOSIS — Z20822 Contact with and (suspected) exposure to covid-19: Secondary | ICD-10-CM | POA: Diagnosis not present

## 2020-06-20 DIAGNOSIS — Z01812 Encounter for preprocedural laboratory examination: Secondary | ICD-10-CM | POA: Diagnosis present

## 2020-06-20 LAB — CBC AND DIFFERENTIAL
HCT: 44 (ref 36–46)
Hemoglobin: 14.8 (ref 12.0–16.0)
Platelets: 272 (ref 150–399)
WBC: 6.5

## 2020-06-20 LAB — TSH: TSH: 8.13 — AB (ref 0.41–5.90)

## 2020-06-20 LAB — SARS CORONAVIRUS 2 (TAT 6-24 HRS): SARS Coronavirus 2: NEGATIVE

## 2020-06-20 LAB — CBC: RBC: 4.48 (ref 3.87–5.11)

## 2020-06-21 ENCOUNTER — Ambulatory Visit (HOSPITAL_COMMUNITY): Payer: Medicare HMO | Admitting: Certified Registered Nurse Anesthetist

## 2020-06-21 ENCOUNTER — Encounter (HOSPITAL_COMMUNITY)
Admission: RE | Disposition: A | Discharge status: Home / Self Care | Payer: Self-pay | Source: Home / Self Care | Attending: Otolaryngology

## 2020-06-21 ENCOUNTER — Other Ambulatory Visit: Payer: Self-pay

## 2020-06-21 ENCOUNTER — Encounter (HOSPITAL_COMMUNITY): Payer: Self-pay | Admitting: Otolaryngology

## 2020-06-21 ENCOUNTER — Ambulatory Visit (HOSPITAL_COMMUNITY)
Admission: RE | Admit: 2020-06-21 | Discharge: 2020-06-21 | Disposition: A | Discharge status: Home / Self Care | Payer: Medicare HMO | Attending: Otolaryngology | Admitting: Otolaryngology

## 2020-06-21 DIAGNOSIS — W01198A Fall on same level from slipping, tripping and stumbling with subsequent striking against other object, initial encounter: Secondary | ICD-10-CM | POA: Diagnosis not present

## 2020-06-21 DIAGNOSIS — S022XXA Fracture of nasal bones, initial encounter for closed fracture: Secondary | ICD-10-CM | POA: Insufficient documentation

## 2020-06-21 DIAGNOSIS — Z8249 Family history of ischemic heart disease and other diseases of the circulatory system: Secondary | ICD-10-CM | POA: Insufficient documentation

## 2020-06-21 DIAGNOSIS — Z79890 Hormone replacement therapy (postmenopausal): Secondary | ICD-10-CM | POA: Insufficient documentation

## 2020-06-21 DIAGNOSIS — Z888 Allergy status to other drugs, medicaments and biological substances status: Secondary | ICD-10-CM | POA: Diagnosis not present

## 2020-06-21 DIAGNOSIS — Z8042 Family history of malignant neoplasm of prostate: Secondary | ICD-10-CM | POA: Insufficient documentation

## 2020-06-21 DIAGNOSIS — Z79899 Other long term (current) drug therapy: Secondary | ICD-10-CM | POA: Diagnosis not present

## 2020-06-21 DIAGNOSIS — Z803 Family history of malignant neoplasm of breast: Secondary | ICD-10-CM | POA: Diagnosis not present

## 2020-06-21 DIAGNOSIS — J342 Deviated nasal septum: Secondary | ICD-10-CM | POA: Diagnosis not present

## 2020-06-21 DIAGNOSIS — M810 Age-related osteoporosis without current pathological fracture: Secondary | ICD-10-CM | POA: Diagnosis not present

## 2020-06-21 DIAGNOSIS — K219 Gastro-esophageal reflux disease without esophagitis: Secondary | ICD-10-CM | POA: Diagnosis not present

## 2020-06-21 DIAGNOSIS — Z87891 Personal history of nicotine dependence: Secondary | ICD-10-CM | POA: Insufficient documentation

## 2020-06-21 DIAGNOSIS — I10 Essential (primary) hypertension: Secondary | ICD-10-CM | POA: Insufficient documentation

## 2020-06-21 HISTORY — PX: CLOSED REDUCTION NASAL FRACTURE: SHX5365

## 2020-06-21 SURGERY — CLOSED REDUCTION, FRACTURE, NASAL BONE
Anesthesia: General

## 2020-06-21 MED ORDER — PHENYLEPHRINE 40 MCG/ML (10ML) SYRINGE FOR IV PUSH (FOR BLOOD PRESSURE SUPPORT)
PREFILLED_SYRINGE | INTRAVENOUS | Status: DC | PRN
  Administered 2020-06-21: 120 ug via INTRAVENOUS
  Administered 2020-06-21: 80 ug via INTRAVENOUS
  Administered 2020-06-21: 160 ug via INTRAVENOUS

## 2020-06-21 MED ORDER — HYDROCODONE-ACETAMINOPHEN 5-325 MG PO TABS: 1.0000 | ORAL_TABLET | ORAL | 0 refills | Status: AC | PRN

## 2020-06-21 MED ORDER — SUCCINYLCHOLINE CHLORIDE 200 MG/10ML IV SOSY
PREFILLED_SYRINGE | INTRAVENOUS | Status: DC | PRN
  Administered 2020-06-21: 140 mg via INTRAVENOUS

## 2020-06-21 MED ORDER — ORAL CARE MOUTH RINSE: 15.0000 mL | Freq: Once | OROMUCOSAL | Status: AC

## 2020-06-21 MED ORDER — PROPOFOL 10 MG/ML IV BOLUS
INTRAVENOUS | Status: AC
  Filled 2020-06-21: qty 20

## 2020-06-21 MED ORDER — OXYCODONE HCL 5 MG PO TABS: 5.0000 mg | ORAL_TABLET | Freq: Once | ORAL | Status: DC | PRN

## 2020-06-21 MED ORDER — LIDOCAINE-EPINEPHRINE 1 %-1:100000 IJ SOLN
INTRAMUSCULAR | Status: AC
  Filled 2020-06-21: qty 1

## 2020-06-21 MED ORDER — HYDROCODONE-ACETAMINOPHEN 5-325 MG PO TABS
1.0000 | ORAL_TABLET | ORAL | Status: DC | PRN
  Administered 2020-06-21: 1 via ORAL

## 2020-06-21 MED ORDER — BACITRACIN ZINC 500 UNIT/GM EX OINT
TOPICAL_OINTMENT | CUTANEOUS | Status: AC
  Filled 2020-06-21: qty 28.35

## 2020-06-21 MED ORDER — SODIUM CHLORIDE 0.9 % IV SOLN
3.0000 g | INTRAVENOUS | Status: DC
  Filled 2020-06-21: qty 8

## 2020-06-21 MED ORDER — ACETAMINOPHEN 500 MG PO TABS
1000.0000 mg | ORAL_TABLET | Freq: Once | ORAL | Status: AC
  Administered 2020-06-21: 1000 mg via ORAL
  Filled 2020-06-21: qty 2

## 2020-06-21 MED ORDER — OXYCODONE HCL 5 MG/5ML PO SOLN: 5.0000 mg | Freq: Once | ORAL | Status: DC | PRN

## 2020-06-21 MED ORDER — OXYMETAZOLINE HCL 0.05 % NA SOLN
NASAL | Status: DC | PRN
  Administered 2020-06-21: 1 via TOPICAL

## 2020-06-21 MED ORDER — FENTANYL CITRATE (PF) 250 MCG/5ML IJ SOLN
INTRAMUSCULAR | Status: AC
  Filled 2020-06-21: qty 5

## 2020-06-21 MED ORDER — DEXAMETHASONE SODIUM PHOSPHATE 10 MG/ML IJ SOLN
INTRAMUSCULAR | Status: DC | PRN
  Administered 2020-06-21: 10 mg via INTRAVENOUS

## 2020-06-21 MED ORDER — LIDOCAINE-EPINEPHRINE 1 %-1:100000 IJ SOLN
INTRAMUSCULAR | Status: DC | PRN
  Administered 2020-06-21: 6 mL

## 2020-06-21 MED ORDER — SODIUM CHLORIDE 0.9 % IV SOLN
3.0000 g | INTRAVENOUS | Status: AC
  Administered 2020-06-21: 3 g via INTRAVENOUS
  Filled 2020-06-21: qty 3

## 2020-06-21 MED ORDER — HYDROMORPHONE HCL 1 MG/ML IJ SOLN
0.2500 mg | INTRAMUSCULAR | Status: DC | PRN
Start: 2020-06-21 — End: 2020-06-21
  Administered 2020-06-21 (×2): 0.5 mg via INTRAVENOUS

## 2020-06-21 MED ORDER — OXYMETAZOLINE HCL 0.05 % NA SOLN
NASAL | Status: AC
  Filled 2020-06-21: qty 30

## 2020-06-21 MED ORDER — ONDANSETRON HCL 4 MG/2ML IJ SOLN
INTRAMUSCULAR | Status: DC | PRN
  Administered 2020-06-21: 4 mg via INTRAVENOUS

## 2020-06-21 MED ORDER — CHLORHEXIDINE GLUCONATE 0.12 % MT SOLN
15.0000 mL | Freq: Once | OROMUCOSAL | Status: AC
  Administered 2020-06-21: 15 mL via OROMUCOSAL
  Filled 2020-06-21: qty 15

## 2020-06-21 MED ORDER — DOCUSATE SODIUM 100 MG PO CAPS: 100.0000 mg | ORAL_CAPSULE | Freq: Two times a day (BID) | ORAL | 0 refills | Status: AC | PRN

## 2020-06-21 MED ORDER — PROPOFOL 10 MG/ML IV BOLUS
INTRAVENOUS | Status: DC | PRN
  Administered 2020-06-21: 40 mg via INTRAVENOUS
  Administered 2020-06-21: 160 mg via INTRAVENOUS

## 2020-06-21 MED ORDER — FENTANYL CITRATE (PF) 250 MCG/5ML IJ SOLN
INTRAMUSCULAR | Status: DC | PRN
  Administered 2020-06-21: 100 ug via INTRAVENOUS
  Administered 2020-06-21: 50 ug via INTRAVENOUS

## 2020-06-21 MED ORDER — HYDROCODONE-ACETAMINOPHEN 5-325 MG PO TABS
ORAL_TABLET | ORAL | Status: AC
  Filled 2020-06-21: qty 1

## 2020-06-21 MED ORDER — HYDROMORPHONE HCL 1 MG/ML IJ SOLN
INTRAMUSCULAR | Status: AC
  Filled 2020-06-21: qty 1

## 2020-06-21 MED ORDER — PROMETHAZINE HCL 25 MG/ML IJ SOLN: 6.2500 mg | INTRAMUSCULAR | Status: DC | PRN

## 2020-06-21 MED ORDER — LACTATED RINGERS IV SOLN: INTRAVENOUS | Status: DC

## 2020-06-21 MED ORDER — LIDOCAINE 2% (20 MG/ML) 5 ML SYRINGE
INTRAMUSCULAR | Status: DC | PRN
  Administered 2020-06-21: 80 mg via INTRAVENOUS

## 2020-06-21 SURGICAL SUPPLY — 25 items
APL SKNCLS STERI-STRIP NONHPOA (GAUZE/BANDAGES/DRESSINGS) ×1
BENZOIN TINCTURE PRP APPL 2/3 (GAUZE/BANDAGES/DRESSINGS) ×2 IMPLANT
BLADE SURG 15 STRL LF DISP TIS (BLADE) IMPLANT
BLADE SURG 15 STRL SS (BLADE)
CANISTER SUCT 3000ML PPV (MISCELLANEOUS) ×2 IMPLANT
CLSR STERI-STRIP ANTIMIC 1/2X4 (GAUZE/BANDAGES/DRESSINGS) ×1 IMPLANT
COVER WAND RF STERILE (DRAPES) ×1 IMPLANT
DRAPE HALF SHEET 40X57 (DRAPES) IMPLANT
GLOVE BIOGEL M 7.0 STRL (GLOVE) ×2 IMPLANT
GOWN STRL REUS W/ TWL LRG LVL3 (GOWN DISPOSABLE) ×1 IMPLANT
GOWN STRL REUS W/TWL LRG LVL3 (GOWN DISPOSABLE) ×2
KIT BASIN OR (CUSTOM PROCEDURE TRAY) ×2 IMPLANT
KIT TURNOVER KIT B (KITS) ×2 IMPLANT
NDL HYPO 25GX1X1/2 BEV (NEEDLE) IMPLANT
NDL HYPO 25X1 1.5 SAFETY (NEEDLE) ×1 IMPLANT
NEEDLE HYPO 25GX1X1/2 BEV (NEEDLE) ×2 IMPLANT
NEEDLE HYPO 25X1 1.5 SAFETY (NEEDLE) ×2 IMPLANT
PAD ARMBOARD 7.5X6 YLW CONV (MISCELLANEOUS) ×4 IMPLANT
SPLINT NASAL DOYLE BI-VL (GAUZE/BANDAGES/DRESSINGS) ×1 IMPLANT
SPLINT NASAL THERMO PLAST (MISCELLANEOUS) ×2 IMPLANT
SPONGE NEURO XRAY DETECT 1X3 (DISPOSABLE) ×2 IMPLANT
STRIP CLOSURE SKIN 1/2X4 (GAUZE/BANDAGES/DRESSINGS) ×2 IMPLANT
SUT SILK 2 0 PERMA HAND 18 BK (SUTURE) ×1 IMPLANT
SYR CONTROL 10ML LL (SYRINGE) ×2 IMPLANT
TRAY ENT MC OR (CUSTOM PROCEDURE TRAY) ×2 IMPLANT

## 2020-06-21 NOTE — Op Note (Signed)
OPERATIVE NOTE  Rachel [name] Date/Time of Admission: 06/21/2020  6:28 AM  CSN: [id number];HRC:[id number] Attending Provider: Ebbie Latus A, DO Room/Bed: MCPO/NONE DOB: Jun 14, 1950 Age: 70 y.o.   Pre-Op Diagnosis: Nasal bone fracture, septal fracture, septal deviation  Post-Op Diagnosis: Nasal bone fracture, septal fracture, septal deviation  Procedure: Procedure(s): CLOSED REDUCTION NASAL FRACTURE  Anesthesia: General  Surgeon(s): Tage Feggins A Khamiyah Grefe, DO  Staff: Circulator: Rexanne Mano, RN Scrub Person: Rowe Robert  Implants: * No implants in log *  Specimens: * No specimens in log *  Complications: None  EBL: 15 ML  IVF: See anesthesia log  Condition: stable  Operative Findings:  Depressed left nasal bone fracture with deviation of nasal dorsum to the right. Left septal deviation.   Description of Operation: The patient was brought to the operating room on 06/21/2020 and placed in supine position on the operating table. General LMA anesthesia was established without difficulty. When the patient was adequately anesthetized, surgical timeout was performed and correct identification of the patient and the surgical procedure was achieved. The patient's nose was then injected with 5cc of 1% lidocaine with 1:100,000 dilution epinephrine which was injected in a submucosal fashion along the nasal dorsum. The patient's nose was then packed with Afrin-soaked cottonoid pledgets which were left in place for several minutes to allow for vasoconstriction and hemostasis. The patient was then positioned, prepped and draped in sterile fashion.  The patient's nasal cavity was inspected, and a nasal dorsal deviation to the right with depressed nasal fracture on the left was noted.  Closed reduction was undertaken beginning with elevation of the depressed nasal fracture using a Licensed conveyancer.  Using external digital pressure, the nasal fracture was reduced and the  nasal dorsum was brought to a good midline position. The nasal septal fracture was then reduced using Asch septum straightening forceps, and the left nasal cavity appeared markedly more patent following reduction. There was no significant bleeding and no evidence of intranasal laceration.  Doyle nasal splints covered in bacitracin ointment were then secured to the columella using 2-0 silk suture. An external nasal dorsal splint was then placed, this consisted of benzoin, quarter-inch paper tape and Aquaplast external nasal splint. An orogastric tube was then placed to reduce postoperative nausea.  The patient was awakened from their anesthetic and transferred from the operating room to the recovery room in stable condition.   Jason Coop, [facility]  06/21/2020

## 2020-06-21 NOTE — Discharge Instructions (Signed)
Post-operative Instructions Following  Closed Reduction of Nasal Fracture  [facility] [phone]  General: Closed reduction of nasal fracture is performed as an outpatient procedure under local anesthesia,  sedation or general anesthesia depending on the type of fracture and age of the patient. If you have other  medical conditions such as sleep apnea, you may spend one night in the hospital after your procedure.  You will wear an external nasal cast for approximately one week following the procedure. On occasion,  nasal sponge packing is placed to minimize post-operative bleeding. The nose may be congested or  obstructed in the first few to several days following the procedure. This is relieved with saline spray and  decongestant spray as directed by your doctor (see Nasal Care following the Surgery below). Mild to  moderate nasal discomfort, bruising under the eyes (black eyes) and oozing of blood from the nose is  expected in the first 48 hours.   Diet: You may have liquids by mouth once you have awakened from anesthesia. If you tolerate the liquids  without significant nausea or vomiting then you may take solid foods without restrictions. If nausea is  persistent, an anti-emetic medication may be prescribed for you. Some patients experience a mild sore  throat for 2-3 days following the procedure. This usually does not interfere with swallowing.  Pain control: Patients report mild to moderate nasal pain, congestion and headache for a few to several days following  septoplasty. This is usually well controlled with prescription strength oral pain medications (Vicodin,  Tylenol #3, Ultracet). Please take the pain medication prescribed by your surgeon when needed. You  may instead use over-the-counter non-steroidal anti-inflammatory drugs (NSAIDS) such as ibuprofen or  naproxen (Motrin, Naprosyn, Advil) if your doctor has advised you these drugs are appropriate for your  care.  Please contact our office if your pain is not controlled with your prescription pain  medication or if you have any questions regarding your post-operative pain control.  Activity: No heavy lifting, straining or strenuous exercise for 1 week following the surgery. You should plan for  1 week away from work. If your job requires manual labor, lifting or straining then you should be out of  work for 1 week or limited to light duty until the 1 week post-op mark. Walking and other light  activities are encouraged after the first 24 hours.   *If you are an athlete involved in contact sports (basketball, football, soccer, wrestling, etc.) your doctor  will discuss specific return to play instructions with you after your procedure. Age, level of competition  and the severity of the fracture all determine when you can return to competition. A protective mask or  nasal cast may be prescribed.   Nasal care following the surgery: Spray the nose 3 times daily with saline solution beginning the evening of surgery. This can be  accomplished with an Ocean Saline Spray or Simply Saline bottle (available over the counter at [employer]). Also, spray the nose with nasal decongestant (such as Afrin or Neo-Synephrine) two  sprays to each nostril twice daily for two days following the procedure. Hot steam showers as needed  are very helpful in relieving nasal congestion and crusting or scabbing in the nose. Sleep with the head  elevated for the first 48 hours; this will minimize pain and congestion. You may use two pillows to do  this or sleep in a reclining chair. You may get the nasal cast wet in the  shower 48 hours after your  procedure. You may let the cast air dry and dab the area around it with a towel.  Follow-up appointment: Your follow up appointment in the office will be 5-8 days following your procedure. This visit should  be scheduled prior to your surgery (at the time of your pre-operative  visit). The nasal cast will be  removed at this visit. If you do not have the appointment made, please contact our office when you  arrive home from the surgery center.   Please call our office immediately if you experience: *Brisk nose bleeding *Fever greater than 101 degrees Fahrenheit *Purulent discharge (pus) coming from the nose *Severe nasal pain or headache Call 911 for severe bleeding or difficulty breathing

## 2020-06-21 NOTE — Anesthesia Procedure Notes (Signed)
Procedure Name: Intubation Date/Time: 06/21/2020 9:17 AM Performed by: Dorthea Cove, CRNA Pre-anesthesia Checklist: Patient identified, Emergency Drugs available, Suction available and Patient being monitored Patient Re-evaluated:Patient Re-evaluated prior to induction Oxygen Delivery Method: Circle system utilized Preoxygenation: Pre-oxygenation with 100% oxygen Induction Type: IV induction Ventilation: Mask ventilation without difficulty Laryngoscope Size: Mac and 3 Grade View: Grade I Tube type: Oral Tube size: 7.0 mm Number of attempts: 1 Airway Equipment and Method: Stylet and Oral airway Placement Confirmation: ETT inserted through vocal cords under direct vision,  positive ETCO2 and breath sounds checked- equal and bilateral Secured at: 22 cm Tube secured with: Tape Dental Injury: Teeth and Oropharynx as per pre-operative assessment

## 2020-06-21 NOTE — Transfer of Care (Signed)
Immediate Anesthesia Transfer of Care Note  Patient: Rachel [name]  Procedure(s) Performed: CLOSED REDUCTION NASAL FRACTURE (N/A )  Patient Location: PACU  Anesthesia Type:General  Level of Consciousness: awake, alert  and oriented  Airway & Oxygen Therapy: Patient Spontanous Breathing and Patient connected to face mask oxygen  Post-op Assessment: Report given to RN and Post -op Vital signs reviewed and stable  Post vital signs: Reviewed and stable  Last Vitals:  Vitals Value Taken Time  BP 110/74 06/21/20 0957  Temp    Pulse 80 06/21/20 0959  Resp 14 06/21/20 0959  SpO2 91 % 06/21/20 0959  Vitals shown include unvalidated device data.  Last Pain:  Vitals:   06/21/20 0734  TempSrc:   PainSc: 0-No pain      Patients Stated Pain Goal: 5 (58/09/98 3382)  Complications: No complications documented.

## 2020-06-21 NOTE — Anesthesia Preprocedure Evaluation (Addendum)
Anesthesia Evaluation  Patient identified by MRN, date of birth, ID band Patient awake    Reviewed: Allergy & Precautions, H&P , NPO status , Patient's Chart, lab work & pertinent test results  Airway Mallampati: II  TM Distance: >3 FB Neck ROM: Full    Dental no notable dental hx. (+) Teeth Intact, Dental Advisory Given   Pulmonary former smoker,  Quit smoking 2005   Pulmonary exam normal breath sounds clear to auscultation       Cardiovascular hypertension, Pt. on medications Normal cardiovascular exam Rhythm:Regular Rate:Normal     Neuro/Psych negative neurological ROS  negative psych ROS   GI/Hepatic Neg liver ROS, GERD  Controlled,  Endo/Other  Hypothyroidism Obesity BMI 34  Renal/GU negative Renal ROS  negative genitourinary   Musculoskeletal negative musculoskeletal ROS (+)   Abdominal (+) + obese,   Peds negative pediatric ROS (+)  Hematology negative hematology ROS (+)   Anesthesia Other Findings Nasal fx s/p fall  Reproductive/Obstetrics negative OB ROS                            Anesthesia Physical Anesthesia Plan  ASA: III  Anesthesia Plan: General   Post-op Pain Management:    Induction: Intravenous and Rapid sequence  PONV Risk Score and Plan: 3 and Ondansetron, Dexamethasone, Midazolam and Treatment may vary due to age or medical condition  Airway Management Planned: Oral ETT  Additional Equipment: None  Intra-op Plan:   Post-operative Plan: Extubation in OR  Informed Consent: I have reviewed the patients History and Physical, chart, labs and discussed the procedure including the risks, benefits and alternatives for the proposed anesthesia with the patient or authorized representative who has indicated his/her understanding and acceptance.     Dental advisory given  Plan Discussed with: CRNA  Anesthesia Plan Comments: (No mask ventilation)        Anesthesia Quick Evaluation

## 2020-06-21 NOTE — Anesthesia Postprocedure Evaluation (Signed)
Anesthesia Post Note  Patient: Rachel [name]  Procedure(s) Performed: CLOSED REDUCTION NASAL FRACTURE (N/A )     Patient location during evaluation: PACU Anesthesia Type: General Level of consciousness: awake and alert, oriented and patient cooperative Pain management: pain level controlled Vital Signs Assessment: post-procedure vital signs reviewed and stable Respiratory status: spontaneous breathing, nonlabored ventilation and respiratory function stable Cardiovascular status: blood pressure returned to baseline and stable Postop Assessment: no apparent nausea or vomiting Anesthetic complications: no   No complications documented.  Last Vitals:  Vitals:   06/21/20 1030 06/21/20 1035  BP: 113/85   Pulse: 63 65  Resp: 15 12  Temp:    SpO2: 98% 96%    Last Pain:  Vitals:   06/21/20 1030  TempSrc:   PainSc: [place]

## 2020-06-21 NOTE — H&P (Signed)
Rachel [name] is an 70 y.o. female.    Chief Complaint:  Nasal bone fracture  HPI: Patient presents today for planned elective procedure. No interval change in history since she was seen in the office on 06/19/20:  Rachel [name] is a 70 y.o. female with history of HTN, obesity, osteoporosis, GERD who presents as a new patient, referred by Leilani Able*, for evaluation and treatment of nasal fracture. Patient presented to the ED on 06/13/2020 after fall from standing. Patient states that while she was walking, she tripped and fell forward and hit her face on a curb. Maxillofacial CT was obtained in the ED which demonstrated a comminuted and displaced fractures of the left nasal bone, fracture of the anterior superior nasal septum, nasal septum is deviated to the left. Patient denies significant nasal obstruction since injury, but endorses change in the cosmetic appearance of her nose. She denies ongoing issues with epistaxis.     Past Medical History:  Diagnosis Date  . Abnormal mammogram   . Brain aneurysm 2005  . GERD (gastroesophageal reflux disease)   . Hypertension   . Hypothyroidism   . Osteoporosis   . Thyroid disease     Past Surgical History:  Procedure Laterality Date  . BRAIN SURGERY     2005    DR CABBELL   . COLONOSCOPY    . LAPAROSCOPIC BILATERAL SALPINGO OOPHERECTOMY Bilateral 11/12/2015   Procedure: LAPAROSCOPIC BILATERAL SALPINGO OOPHORECTOMY;  Surgeon: Everett Graff, MD;  Location: [facility];  Service: Gynecology;  Laterality: Bilateral;  . LAPAROSCOPY N/A 11/22/2015   Procedure: LAPAROSCOPY DIAGNOSTIC with repair of serosal tear;  Surgeon: Everett Graff, MD;  Location: [facility];  Service: Gynecology;  Laterality: N/A;  . RADIOACTIVE SEED GUIDED EXCISIONAL BREAST BIOPSY Left 09/26/2015   Procedure: LEFT RADIOACTIVE SEED GUIDED EXCISIONAL BREAST BIOPSY;  Surgeon: Stark Klein, MD;  Location: [facility];  Service: General;  Laterality: Left;  . RADIOACTIVE SEED GUIDED  EXCISIONAL BREAST BIOPSY Left 02/20/2020   Procedure: LEFT RADIOACTIVE SEED GUIDED EXCISIONAL BREAST BIOPSY;  Surgeon: Stark Klein, MD;  Location: [facility];  Service: General;  Laterality: Left;  RNFA  . WISDOM TOOTH EXTRACTION      Family History  Problem Relation Age of Onset  . Cancer Mother        Breast   . Hypertension Mother   . Cancer Father        Prostate  . Hypertension Father   . Cancer Sister        Breast    Social History:  reports that she quit smoking about 16 years ago. Her smoking use included cigarettes. She has a 25.00 pack-year smoking history. She has never used smokeless tobacco. She reports that she does not drink alcohol and does not use drugs.  Allergies:  Allergies  Allergen Reactions  . Dilantin [Phenytoin Sodium Extended] Rash    Medications Prior to Admission  Medication Sig Dispense Refill  . calcium carbonate (TUMS - DOSED IN MG ELEMENTAL CALCIUM) 500 MG chewable tablet Chew 4 tablets by mouth 2 (two) times daily as needed for indigestion or heartburn.    . COMBIGAN 0.2-0.5 % ophthalmic solution Place 1 drop into the right eye 2 (two) times daily.  6  . denosumab (PROLIA) 60 MG/ML SOSY injection Inject 60 mg into the skin every 6 (six) months. 60 mL 1  . HYDROcodone-acetaminophen (NORCO/VICODIN) 5-325 MG tablet Take 1 tablet by mouth every 4 (four) hours as needed for severe pain. 15  tablet 0  . simvastatin (ZOCOR) 40 MG tablet TAKE 1 TABLET BY MOUTH EVERY DAY 90 tablet 2  . solifenacin (VESICARE) 5 MG tablet Take 5 mg by mouth daily.    Marland Kitchen SYNTHROID 88 MCG tablet TAKE 1 TABLET BY MOUTH DAILY BEFORE BREAKFAST. 1 TABLET ON MON - FRI AND 1/2 TAB SAT-SUN 77 tablet 1  . temazepam (RESTORIL) 15 MG capsule TAKE 1 CAPSULE BY MOUTH EVERY DAY AT BEDTIME AS NEEDED 30 capsule 1  . terbinafine (LAMISIL) 250 MG tablet Take 1 tablet (250 mg total) by mouth daily. 90 tablet 0  . valsartan-hydrochlorothiazide (DIOVAN-HCT) 80-12.5 MG tablet TAKE 1  TABLET BY MOUTH EVERY DAY 90 tablet 1  . cephALEXin (KEFLEX) 500 MG capsule Take 1 capsule (500 mg total) by mouth 4 (four) times daily. 20 capsule 0  . Cholecalciferol (VITAMIN D3) 5000 units CAPS Take 5,000 Units by mouth daily.     . famotidine (PEPCID) 40 MG tablet Take 40 mg by mouth 2 (two) times daily.    . ondansetron (ZOFRAN ODT) 4 MG disintegrating tablet Take 1 tablet (4 mg total) by mouth every 8 (eight) hours as needed for nausea or vomiting. 20 tablet 0  . pantoprazole (PROTONIX) 40 MG tablet TAKE 1 TABLET BY MOUTH ONCE A DAY 90 tablet 2    Results for orders placed or performed during the hospital encounter of 06/20/20 (from the past 48 hour(s))  SARS CORONAVIRUS 2 (TAT 6-24 HRS) Nasopharyngeal Nasopharyngeal Swab     Status: None   Collection Time: 06/20/20  8:19 AM   Specimen: Nasopharyngeal Swab  Result Value Ref Range   SARS Coronavirus 2 NEGATIVE NEGATIVE    Comment: (NOTE) SARS-CoV-2 target nucleic acids are NOT DETECTED.  The SARS-CoV-2 RNA is generally detectable in upper and lower respiratory specimens during the acute phase of infection. Negative results do not preclude SARS-CoV-2 infection, do not rule out co-infections with other pathogens, and should not be used as the sole basis for treatment or other patient management decisions. Negative results must be combined with clinical observations, patient history, and epidemiological information. The expected result is Negative.  Fact Sheet for Patients: SugarRoll.be  Fact Sheet for Healthcare Providers: https://www.woods-mathews.com/  This test is not yet approved or cleared by the [place] FDA and  has been authorized for detection and/or diagnosis of SARS-CoV-2 by FDA under an Emergency Use Authorization (EUA). This EUA will remain  in effect (meaning this test can be used) for the duration of the COVID-19 declaration under Se ction 564(b)(1) of the Act, 21  U.S.C. section 360bbb-3(b)(1), unless the authorization is terminated or revoked sooner.  Performed at [facility], [place] [address] [place], [place] [address]    No results found.  ROS: ROS  Blood pressure 100/69, pulse 76, temperature (!) 97.1 F (36.2 C), temperature source Oral, resp. rate 18, height 5\' 4"  (1.626 m), weight 90.7 kg, SpO2 97 %.  PHYSICAL EXAM: General: Well developed, well nourished. No acute distress. Voice intact, no voice breaks  Head/Face: 3 cm area of edema over central brow, sutures in place, mild scabbing and crusting around sutures. Bilateral infraorbital bruising and edema. Facial nerve intact and equal bilaterally.  Eyes: Pupils are equal, round and reactive to light. Conjunctiva and lids are normal. Normal extraocular mobility.  Ears: Right: Pinna and external meatus normal Left: Pinna and external meatus normal Nose: Mild edema over nasal dorsum, nasal dorsum appears deviated to the right, with palpable step-off deformity of the  left nasal bones. Septum deviated to the left 2+, 2+ turbinate hypertrophy, no active bleeding appreciated. No evidence of septal hematoma.  Mouth/Oropharynx: Lips normal, teeth and gums normal with good dentition, normal oral vestibule. Neck: Trachea midline.  Respiratory: No stridor or distress.  Extremities: No edema or cyanosis. Warm and well-perfused.  Neurologic:  Alert and oriented to self, place and time. Normal reflexes and motor skills, balance and coordination. Moving all extremities without gross abnormality.  Psychiatric: No unusual anxiety or evidence of depression. Appropriate affect.    Studies Reviewed: Maxillofacial CT reviewed   Assessment/Plan Faron Whitelock is a 70 y.o. female with history of fall from standing on 06/13/2020 with subsequent nasal bone fracture and soft tissue facial injury. - To OR today for closed reduction of nasal bone fracture. Risks, benefits, and alternatives of the planned  procedure as well as expected postoperative recovery were discussed and the patient expressed understanding and agreement. She will follow up 1 week after surgery for splint removal.    Adrea Sherpa A Chara Marquard 06/21/2020, 7:29 AM

## 2020-06-22 ENCOUNTER — Encounter (HOSPITAL_COMMUNITY): Payer: Self-pay | Admitting: Otolaryngology

## 2020-06-26 ENCOUNTER — Telehealth: Payer: Self-pay

## 2020-06-26 ENCOUNTER — Encounter: Payer: Self-pay | Admitting: Internal Medicine

## 2020-06-26 NOTE — Telephone Encounter (Signed)
I called the pt because Dr. Baird Cancer wants to know if the pt needs both the Famotidine and pantoprazole 1 tab each to control her reflux.

## 2020-07-01 ENCOUNTER — Other Ambulatory Visit: Payer: Self-pay

## 2020-07-04 ENCOUNTER — Telehealth: Payer: Self-pay

## 2020-07-04 NOTE — Telephone Encounter (Signed)
The pt confirmed that she has been taking 88 mcg Monday - Friday , 1/2 tab 88 mcg Saturday - Sunday. Samples placed upfront f/u appt scheduled.

## 2020-07-04 NOTE — Telephone Encounter (Signed)
-----   Message from Glendale Chard, MD sent at 07/03/2020  8:00 PM EST ----- Regarding: RE: thyroid Please tell her I apologize for the delay. But since the original labs were drawn by Dr. Collene Mares, the additional results went to her as well, even though I placed the order. I apologize.   Has she been taking 24 m-f and 1/2 on Sat and Sundays? If yes,   She can do 63mcg M-Sat and take 145mcg on Sundays only. We can give her samples of the 100s. She should have f/u in six to seven weeks.   RS ----- Message ----- From: Michelle Nasuti, [place]: 07/03/2020   4:55 PM EST To: Glendale Chard, MD Subject: thyroid                                        The pt called to check on thyroid results  the pt said that has been feeling tried and thinks that her level might need to be changed.

## 2020-07-05 ENCOUNTER — Encounter: Payer: Self-pay | Admitting: Internal Medicine

## 2020-07-05 ENCOUNTER — Other Ambulatory Visit: Payer: Self-pay

## 2020-07-05 ENCOUNTER — Telehealth: Payer: Self-pay

## 2020-07-05 DIAGNOSIS — E039 Hypothyroidism, unspecified: Secondary | ICD-10-CM

## 2020-07-05 NOTE — Telephone Encounter (Signed)
I called the pt to let her know that Dr. Baird Cancer has placed a referral to [facility] for thyroid evaluation.

## 2020-07-08 ENCOUNTER — Telehealth: Payer: Self-pay

## 2020-07-08 NOTE — Telephone Encounter (Signed)
The pt was notified that Dr . Baird Cancer placed a referral to [facility] endocrinology for thyroid evaluation.

## 2020-07-18 ENCOUNTER — Other Ambulatory Visit: Payer: Self-pay | Admitting: Podiatry

## 2020-07-19 NOTE — Telephone Encounter (Signed)
Please Advise

## 2020-08-16 ENCOUNTER — Encounter: Payer: Self-pay | Admitting: Internal Medicine

## 2020-08-16 ENCOUNTER — Other Ambulatory Visit: Payer: Self-pay

## 2020-08-16 ENCOUNTER — Ambulatory Visit (INDEPENDENT_AMBULATORY_CARE_PROVIDER_SITE_OTHER): Payer: Medicare HMO | Admitting: Internal Medicine

## 2020-08-16 VITALS — BP 118/72 | HR 80 | Ht 64.0 in | Wt 202.5 lb

## 2020-08-16 DIAGNOSIS — E039 Hypothyroidism, unspecified: Secondary | ICD-10-CM | POA: Diagnosis not present

## 2020-08-16 LAB — TSH: TSH: 5.21 u[IU]/mL — ABNORMAL HIGH (ref 0.35–4.50)

## 2020-08-16 MED ORDER — SYNTHROID 100 MCG PO TABS: 100.0000 ug | ORAL_TABLET | Freq: Every day | ORAL | 1 refills | Status: DC

## 2020-08-16 NOTE — Patient Instructions (Signed)
You are on Synthroid- which is your thyroid hormone supplement. You MUST take this consistently.  You should take this first thing in the morning on an empty stomach with water. You should not take it with other medications. Wait 30 min to 1hr prior to eating. If you are taking any vitamins - please take these in the evening.   If you miss a dose, please take your missed dose the following day (double the dose for that day). You should have a pill box for ONLY Synthroid on your bedside table to help you remember to take your medications.

## 2020-08-16 NOTE — Progress Notes (Signed)
Name: Rachel [name]  MRN/ DOB: [id number], [birth year]    Age/ Sex: 70 y.o., female    PCP: Glendale Chard, MD   Reason for Endocrinology Evaluation: Hypothyroidism     Date of Initial Endocrinology Evaluation: 08/16/2020     HPI: Ms. Rachel [name] is a 70 y.o. female with a past medical history of HTN and pulmonary fibrosis. The patient presented for initial endocrinology clinic visit on 08/16/2020 for consultative assistance with her hypothyroidism.   She has been diagnosed with hypothyroidism years ago. Has been on Lt-4 ever since.   Uses a pill box for synthroid and takes it appropriately   She has been tired but denies constipation or depression  Denies local neck symptoms     Denies biotin intake  Denies prior exposure to radiation   Synthroid 88 mcg Mon-Saturday  Synthroid 100 mcg on Sundays   Sister with thyroid disease   HISTORY:  Past Medical History:  Past Medical History:  Diagnosis Date  . Abnormal mammogram   . Brain aneurysm 2005  . GERD (gastroesophageal reflux disease)   . Hypertension   . Hypothyroidism   . Osteoporosis   . Thyroid disease    Past Surgical History:  Past Surgical History:  Procedure Laterality Date  . BRAIN SURGERY     20 05    DR CABBELL   . CLOSED REDUCTION NASAL FRACTURE N/A 06/21/2020   Procedure: CLOSED REDUCTION NASAL FRACTURE;  Surgeon: Jason Coop, DO;  Location: [facility];  Service: ENT;  Laterality: N/A;  . COLONOSCOPY    . LAPAROSCOPIC BILATERAL SALPINGO OOPHERECTOMY Bilateral 11/12/2015   Procedure: LAPAROSCOPIC BILATERAL SALPINGO OOPHORECTOMY;  Surgeon: Everett Graff, MD;  Location: [facility];  Service: Gynecology;  Laterality: Bilateral;  . LAPAROSCOPY N/A 11/22/2015   Procedure: LAPAROSCOPY DIAGNOSTIC with repair of serosal tear;  Surgeon: Everett Graff, MD;  Location: [facility];  Service: Gynecology;  Laterality: N/A;  . RADIOACTIVE SEED GUIDED EXCISIONAL BREAST BIOPSY Left 09/26/2015   Procedure: LEFT  RADIOACTIVE SEED GUIDED EXCISIONAL BREAST BIOPSY;  Surgeon: Stark Klein, MD;  Location: [facility];  Service: General;  Laterality: Left;  . RADIOACTIVE SEED GUIDED EXCISIONAL BREAST BIOPSY Left 02/20/2020   Procedure: LEFT RADIOACTIVE SEED GUIDED EXCISIONAL BREAST BIOPSY;  Surgeon: Stark Klein, MD;  Location: [facility];  Service: General;  Laterality: Left;  RNFA  . WISDOM TOOTH EXTRACTION        Social History:  reports that she quit smoking about 16 years ago. Her smoking use included cigarettes. She has a 25.00 pack-year smoking history. She has never used smokeless tobacco. She reports that she does not drink alcohol and does not use drugs.  Family History: family history includes Cancer in her father, mother, and sister; Hypertension in her father and mother.   HOME MEDICATIONS: Allergies as of 08/16/2020      Reactions   Dilantin [phenytoin Sodium Extended] Rash      Medication List       Accurate as of August 16, 2020  9:38 AM. If you have any questions, ask your nurse or doctor.        STOP taking these medications   cephALEXin 500 MG capsule Commonly known as: KEFLEX Stopped by: Dorita Sciara, MD   famotidine 40 MG tablet Commonly known as: PEPCID Stopped by: Dorita Sciara, MD     TAKE these medications   calcium carbonate 1500 (600 Ca) MG Tabs tablet Commonly known as: OSCAL Take 600 mg of  elemental calcium by mouth daily with breakfast.   calcium carbonate 500 MG chewable tablet Commonly known as: TUMS - dosed in mg elemental calcium Chew 4 tablets by mouth 2 (two) times daily as needed for indigestion or heartburn.   Combigan 0.2-0.5 % ophthalmic solution Generic drug: brimonidine-timolol Place 1 drop into the right eye 2 (two) times daily.   denosumab 60 MG/ML Sosy injection Commonly known as: PROLIA Inject 60 mg into the skin every 6 (six) months.   Magnesium 250 MG Tabs Take 250 mg by mouth daily.   ondansetron 4 MG  disintegrating tablet Commonly known as: Zofran ODT Take 1 tablet (4 mg total) by mouth every 8 (eight) hours as needed for nausea or vomiting.   pantoprazole 40 MG tablet Commonly known as: PROTONIX TAKE 1 TABLET BY MOUTH ONCE A DAY   simvastatin 40 MG tablet Commonly known as: ZOCOR TAKE 1 TABLET BY MOUTH EVERY DAY What changed: when to take this   solifenacin 5 MG tablet Commonly known as: VESICARE Take 5 mg by mouth daily.   Synthroid 100 MCG tablet Generic drug: levothyroxine Only on sundays What changed:   Another medication with the same name was changed. Make sure you understand how and when to take each.  Another medication with the same name was removed. Continue taking this medication, and follow the directions you see here. Changed by: Dorita Sciara, MD   Synthroid 88 MCG tablet Generic drug: levothyroxine TAKE 1 TABLET BY MOUTH DAILY BEFORE BREAKFAST. 1 TABLET ON MON - FRI AND 1/2 TAB SAT-SUN What changed:   See the new instructions.  Another medication with the same name was removed. Continue taking this medication, and follow the directions you see here.   temazepam 15 MG capsule Commonly known as: RESTORIL TAKE 1 CAPSULE BY MOUTH EVERY DAY AT BEDTIME AS NEEDED What changed:   how much to take  how to take this  when to take this  reasons to take this  additional instructions   terbinafine 250 MG tablet Commonly known as: LamISIL Take 1 tablet (250 mg total) by mouth daily.   valsartan-hydrochlorothiazide 80-12.5 MG tablet Commonly known as: DIOVAN-HCT TAKE 1 TABLET BY MOUTH EVERY DAY   Vitamin D3 125 MCG (5000 UT) Caps Take 5,000 Units by mouth daily.         REVIEW OF SYSTEMS: A comprehensive ROS was conducted with the patient and is negative except as per HPI   OBJECTIVE:  VS: BP 118/72   Pulse 80   Ht 5\' 4"  (1.626 m)   Wt 202 lb 8 oz (91.9 kg)   SpO2 98%   BMI 34.76 kg/m    Wt Readings from Last 3 Encounters:   08/16/20 202 lb 8 oz (91.9 kg)  06/21/20 200 lb (90.7 kg)  06/13/20 200 lb (90.7 kg)     EXAM: General: Pt appears well and is in NAD  Eyes: External eye exam normal without stare, lid lag or exophthalmos.  EOM intact.    Neck: General: Supple without adenopathy. Thyroid: Thyroid size normal.  No goiter or nodules appreciated. No thyroid bruit.  Lungs: Clear with good BS bilat with no rales, rhonchi, or wheezes  Heart: Auscultation: RRR.  Abdomen: Normoactive bowel sounds, soft, nontender, without masses or organomegaly palpable  Extremities:  BL LE: No pretibial edema normal ROM and strength.  Neuro: Cranial nerves: II - XII grossly intact  DTRs: 2+ and symmetric in UE without delay in relaxation phase  Mental  Status: Judgment, insight: Intact Orientation: Oriented to time, place, and person Mood and affect: No depression, anxiety, or agitation     DATA REVIEWED:   Results for Rachel [name], Rachel [name] (MRN [id number]) as of 08/16/2020 12:29  Ref. Range 06/20/2020 00:00 08/16/2020 09:33  TSH Latest Ref Range: 0.35 - 4.50 uIU/mL 8.13 (A) 5.21 (H)    ASSESSMENT/PLAN/RECOMMENDATIONS:   1. Hypothyroidism:  - Pt with fatigue but no other hypothyroid symptoms - No local neck symptoms - - Pt educated extensively on the correct way to take levothyroxine (first thing in the morning with water, 30 minutes before eating or taking other medications). - Pt encouraged to double dose the following day if she were to miss a dose given long half-life of levothyroxine.   Medications : STOP Synthroid 88 mcg Monday- Saturday  Take Synthroid 100 mcg DAILY    F/U in 4 months Labs in 8 weeks     Signed electronically by: Mack Guise, MD  [facility] Endocr[facility]  [facility] [place], [place] [address] Phone: [phone] FAX: [phone]   CC: Glendale Chard, [address] [place] [address] Phone: [phone] Fax:  [phone]   Return to Endocrinology clinic as below: Future Appointments  Date Time Provider [place]  08/20/2020 10:30 AM Glendale Chard, MD [facility] None  09/05/2020  2:30 PM Glendale Chard, MD [facility] None  10/11/2020 11:00 AM [facility] None  11/27/2020  1:15 PM Edrick Kins, DPM [facility]  12/20/2020  2:20 PM Darlisa Spruiell, Melanie Crazier, MD [facility] None  03/26/2021 10:00 AM [facility] None  03/26/2021 10:30 AM Glendale Chard, MD [facility] None

## 2020-08-20 ENCOUNTER — Other Ambulatory Visit: Payer: Self-pay

## 2020-08-20 ENCOUNTER — Ambulatory Visit (INDEPENDENT_AMBULATORY_CARE_PROVIDER_SITE_OTHER): Payer: Medicare HMO | Admitting: Internal Medicine

## 2020-08-20 ENCOUNTER — Encounter: Payer: Self-pay | Admitting: Internal Medicine

## 2020-08-20 VITALS — BP 122/78 | HR 66 | Temp 98.1°F | Ht 64.0 in | Wt 204.4 lb

## 2020-08-20 DIAGNOSIS — N1831 Chronic kidney disease, stage 3a: Secondary | ICD-10-CM | POA: Diagnosis not present

## 2020-08-20 DIAGNOSIS — R911 Solitary pulmonary nodule: Secondary | ICD-10-CM

## 2020-08-20 DIAGNOSIS — E039 Hypothyroidism, unspecified: Secondary | ICD-10-CM

## 2020-08-20 DIAGNOSIS — I129 Hypertensive chronic kidney disease with stage 1 through stage 4 chronic kidney disease, or unspecified chronic kidney disease: Secondary | ICD-10-CM | POA: Diagnosis not present

## 2020-08-20 DIAGNOSIS — W010XXS Fall on same level from slipping, tripping and stumbling without subsequent striking against object, sequela: Secondary | ICD-10-CM

## 2020-08-20 DIAGNOSIS — Z6835 Body mass index (BMI) 35.0-35.9, adult: Secondary | ICD-10-CM

## 2020-08-20 DIAGNOSIS — W19XXXD Unspecified fall, subsequent encounter: Secondary | ICD-10-CM

## 2020-08-20 NOTE — Patient Instructions (Addendum)
Please order Prolia  Take famotidine at dinner ONLY  Hypertension, Adult Hypertension is another name for high blood pressure. High blood pressure forces your heart to work harder to pump blood. This can cause problems over time. There are two numbers in a blood pressure reading. There is a top number (systolic) over a bottom number (diastolic). It is best to have a blood pressure that is below 120/80. Healthy choices can help lower your blood pressure, or you may need medicine to help lower it. What are the causes? The cause of this condition is not known. Some conditions may be related to high blood pressure. What increases the risk?  Smoking.  Having type 2 diabetes mellitus, high cholesterol, or both.  Not getting enough exercise or physical activity.  Being overweight.  Having too much fat, sugar, calories, or salt (sodium) in your diet.  Drinking too much alcohol.  Having long-term (chronic) kidney disease.  Having a family history of high blood pressure.  Age. Risk increases with age.  Race. You may be at higher risk if you are African American.  Gender. Men are at higher risk than women before age 62. After age 23, women are at higher risk than men.  Having obstructive sleep apnea.  Stress. What are the signs or symptoms?  High blood pressure may not cause symptoms. Very high blood pressure (hypertensive crisis) may cause: ? Headache. ? Feelings of worry or nervousness (anxiety). ? Shortness of breath. ? Nosebleed. ? A feeling of being sick to your stomach (nausea). ? Throwing up (vomiting). ? Changes in how you see. ? Very bad chest pain. ? Seizures. How is this treated?  This condition is treated by making healthy lifestyle changes, such as: ? Eating healthy foods. ? Exercising more. ? Drinking less alcohol.  Your health care provider may prescribe medicine if lifestyle changes are not enough to get your blood pressure under control, and if: ? Your  top number is above 130. ? Your bottom number is above 80.  Your personal target blood pressure may vary. Follow these instructions at home: Eating and drinking  If told, follow the DASH eating plan. To follow this plan: ? Fill one half of your plate at each meal with fruits and vegetables. ? Fill one fourth of your plate at each meal with whole grains. Whole grains include whole-wheat pasta, brown rice, and whole-grain bread. ? Eat or drink low-fat dairy products, such as skim milk or low-fat yogurt. ? Fill one fourth of your plate at each meal with low-fat (lean) proteins. Low-fat proteins include fish, chicken without skin, eggs, beans, and tofu. ? Avoid fatty meat, cured and processed meat, or chicken with skin. ? Avoid pre-made or processed food.  Eat less than 1,500 mg of salt each day.  Do not drink alcohol if: ? Your doctor tells you not to drink. ? You are pregnant, may be pregnant, or are planning to become pregnant.  If you drink alcohol: ? Limit how much you use to:  0-1 drink a day for women.  0-2 drinks a day for men. ? Be aware of how much alcohol is in your drink. In the [place] one drink equals one 12 oz bottle of beer (355 mL), one 5 oz glass of wine (148 mL), or one 1 oz glass of hard liquor (44 mL).   Lifestyle  Work with your doctor to stay at a healthy weight or to lose weight. Ask your doctor what the best weight is for  you.  Get at least 30 minutes of exercise most days of the week. This may include walking, swimming, or biking.  Get at least 30 minutes of exercise that strengthens your muscles (resistance exercise) at least 3 days a week. This may include lifting weights or doing Pilates.  Do not use any products that contain nicotine or tobacco, such as cigarettes, e-cigarettes, and chewing tobacco. If you need help quitting, ask your doctor.  Check your blood pressure at home as told by your doctor.  Keep all follow-up visits as told by your doctor.  This is important.   Medicines  Take over-the-counter and prescription medicines only as told by your doctor. Follow directions carefully.  Do not skip doses of blood pressure medicine. The medicine does not work as well if you skip doses. Skipping doses also puts you at risk for problems.  Ask your doctor about side effects or reactions to medicines that you should watch for. Contact a doctor if you:  Think you are having a reaction to the medicine you are taking.  Have headaches that keep coming back (recurring).  Feel dizzy.  Have swelling in your ankles.  Have trouble with your vision. Get help right away if you:  Get a very bad headache.  Start to feel mixed up (confused).  Feel weak or numb.  Feel faint.  Have very bad pain in your: ? Chest. ? Belly (abdomen).  Throw up more than once.  Have trouble breathing. Summary  Hypertension is another name for high blood pressure.  High blood pressure forces your heart to work harder to pump blood.  For most people, a normal blood pressure is less than 120/80.  Making healthy choices can help lower blood pressure. If your blood pressure does not get lower with healthy choices, you may need to take medicine. This information is not intended to replace advice given to you by your health care provider. Make sure you discuss any questions you have with your health care provider. Document Revised: 12/22/2017 Document Reviewed: 12/22/2017 Elsevier Patient Education  2021 [employer].

## 2020-08-20 NOTE — Progress Notes (Signed)
I,[name],acting as a Education administrator for Maximino Greenland, MD.,have documented all relevant documentation on the behalf of Maximino Greenland, MD,as directed by  Maximino Greenland, MD while in the presence of Maximino Greenland, MD.  This visit occurred during the SARS-CoV-2 public health emergency.  Safety protocols were in place, including screening questions prior to the visit, additional usage of staff PPE, and extensive cleaning of exam room while observing appropriate contact time as indicated for disinfecting solutions.  Subjective:     Patient ID: Rachel [name] , female    DOB: 07/19/1950 , 70 y.o.   MRN: [id number]   Chief Complaint  Patient presents with  . Hypertension    HPI  The patient is here today for a blood pressure and thyroid f/u.  She reports compliance with meds. She denies headaches, chest pain and shortness of breath.   Hypertension This is a chronic problem. The current episode started more than 1 year ago. The problem has been gradually improving since onset. The problem is controlled. Pertinent negatives include no blurred vision, chest pain, palpitations or shortness of breath. Risk factors for coronary artery disease include dyslipidemia, obesity, post-menopausal state and sedentary lifestyle. The current treatment provides moderate improvement. Identifiable causes of hypertension include a thyroid problem.  Thyroid Problem Presents for follow-up visit. Patient reports no cold intolerance, constipation, palpitations or visual change. The symptoms have been stable.     Past Medical History:  Diagnosis Date  . Abnormal mammogram   . Brain aneurysm 2005  . GERD (gastroesophageal reflux disease)   . Hypertension   . Hypothyroidism   . Osteoporosis   . Thyroid disease      Family History  Problem Relation Age of Onset  . Cancer Mother        Breast   . Hypertension Mother   . Cancer Father        Prostate  . Hypertension Father   . Cancer Sister         Breast     Current Outpatient Medications:  .  calcium carbonate (OSCAL) 1500 (600 Ca) MG TABS tablet, Take 600 mg of elemental calcium by mouth daily with breakfast., Disp: , Rfl:  .  calcium carbonate (TUMS - DOSED IN MG ELEMENTAL CALCIUM) 500 MG chewable tablet, Chew 4 tablets by mouth 2 (two) times daily as needed for indigestion or heartburn., Disp: , Rfl:  .  Cholecalciferol (VITAMIN D3) 5000 units CAPS, Take 5,000 Units by mouth daily. , Disp: , Rfl:  .  COMBIGAN 0.2-0.5 % ophthalmic solution, Place 1 drop into the right eye 2 (two) times daily., Disp: , Rfl: 6 .  denosumab (PROLIA) 60 MG/ML SOSY injection, Inject 60 mg into the skin every 6 (six) months., Disp: 60 mL, Rfl: 1 .  Magnesium 250 MG TABS, Take 250 mg by mouth daily., Disp: , Rfl:  .  simvastatin (ZOCOR) 40 MG tablet, TAKE 1 TABLET BY MOUTH EVERY DAY (Patient taking differently: Take 40 mg by mouth daily at 6 PM.), Disp: 90 tablet, Rfl: 2 .  solifenacin (VESICARE) 5 MG tablet, Take 5 mg by mouth daily., Disp: , Rfl:  .  SYNTHROID 100 MCG tablet, Take 1 tablet (100 mcg total) by mouth daily before breakfast., Disp: 90 tablet, Rfl: 1 .  temazepam (RESTORIL) 15 MG capsule, TAKE 1 CAPSULE BY MOUTH EVERY DAY AT BEDTIME AS NEEDED (Patient taking differently: Take 15 mg by mouth at bedtime as needed for sleep.), Disp: 30 capsule, Rfl:  1 .  terbinafine (LAMISIL) 250 MG tablet, Take 1 tablet (250 mg total) by mouth daily., Disp: 90 tablet, Rfl: 0 .  valsartan-hydrochlorothiazide (DIOVAN-HCT) 80-12.5 MG tablet, TAKE 1 TABLET BY MOUTH EVERY DAY (Patient taking differently: Take 1 tablet by mouth daily.), Disp: 90 tablet, Rfl: 1 .  ondansetron (ZOFRAN ODT) 4 MG disintegrating tablet, Take 1 tablet (4 mg total) by mouth every 8 (eight) hours as needed for nausea or vomiting. (Patient not taking: Reported on 08/20/2020), Disp: 20 tablet, Rfl: 0 .  pantoprazole (PROTONIX) 40 MG tablet, TAKE 1 TABLET BY MOUTH ONCE A DAY (Patient not taking:  Reported on 08/20/2020), Disp: 90 tablet, Rfl: 2   Allergies  Allergen Reactions  . Dilantin [Phenytoin Sodium Extended] Rash     Review of Systems  Constitutional: Negative.   Eyes: Negative for blurred vision.  Respiratory: Negative.  Negative for shortness of breath.   Cardiovascular: Negative.  Negative for chest pain and palpitations.  Gastrointestinal: Negative.  Negative for constipation.  Endocrine: Negative for cold intolerance.  Musculoskeletal:       She reports she had a fall 2/22. This required ED evaluation. Patient states she was walking in a parking lot, tripped, and fell, striking her face on the curb. Unfortunately, she injured herself after this fall. She suffered multiple lacerations and a nasal fx . She reports she is doing much better today.   Psychiatric/Behavioral: Negative.   All other systems reviewed and are negative.    Today's Vitals   08/20/20 1031  BP: 122/78  Pulse: 66  Temp: 98.1 F (36.7 C)  TempSrc: Oral  Weight: 204 lb 6.4 oz (92.7 kg)  Height: 5\' 4"  (1.626 m)  PainSc: 0-No pain   Body mass index is 35.09 kg/m.  Wt Readings from Last 3 Encounters:  08/20/20 204 lb 6.4 oz (92.7 kg)  08/16/20 202 lb 8 oz (91.9 kg)  06/21/20 200 lb (90.7 kg)   Objective:  Physical Exam Vitals and nursing note reviewed.  Constitutional:      Appearance: Normal appearance.  HENT:     Head: Normocephalic and atraumatic.     Nose:     Comments: Masked     Mouth/Throat:     Comments: Masked  Cardiovascular:     Rate and Rhythm: Normal rate and regular rhythm.     Heart sounds: Normal heart sounds.  Pulmonary:     Effort: Pulmonary effort is normal.     Breath sounds: Normal breath sounds.  Skin:    General: Skin is warm.     Comments: Healed scars on bridge of nose  Neurological:     General: No focal deficit present.     Mental Status: She is alert.  Psychiatric:        Mood and Affect: Mood normal.        Behavior: Behavior normal.          Assessment And Plan:     1. Hypertensive nephropathy Comments: Chronic, controlled. I will not make any medication changes at this time.   2. Stage 3a chronic kidney disease ([place]) Comments: Encouraged to keep BP controlled and to stay well hydrated to delay progression of CKD.   3. Primary hypothyroidism Comments: She is now followed by Endocrinology. No need to adjust meds at this time.   4. Fall from slip, trip, or stumble, sequela Comments: Tripped on uneven pavement.Fall occurred on 2/22. ER report reviewed in full detail.  5. Lung nodule Comments: Small nodule seen  L lower lung base. Due to h/o smoking, I will schedule her for f/u CT chest for further evaluation. - CT Chest Wo Contrast; Future  6. Class 2 severe obesity due to excess calories with serious comorbidity and body mass index (BMI) of 35.0 to 35.9 in adult [facility])  She is encouraged to strive for BMI less than 30 to decrease cardiac risk. Advised to aim for at least 150 minutes of exercise per week.   Patient was given opportunity to ask questions. Patient verbalized understanding of the plan and was able to repeat key elements of the plan. All questions were answered to their satisfaction.   I, Maximino Greenland, MD, have reviewed all documentation for this visit. The documentation on 08/20/20 for the exam, diagnosis, procedures, and orders are all accurate and complete.   IF YOU HAVE BEEN REFERRED TO A SPECIALIST, IT MAY TAKE 1-2 WEEKS TO SCHEDULE/PROCESS THE REFERRAL. IF YOU HAVE NOT HEARD FROM US/SPECIALIST IN TWO WEEKS, PLEASE GIVE [place] A CALL AT [phone].   THE PATIENT IS ENCOURAGED TO PRACTICE SOCIAL DISTANCING DUE TO THE COVID-19 PANDEMIC.

## 2020-08-21 ENCOUNTER — Ambulatory Visit: Payer: Medicare HMO | Admitting: Internal Medicine

## 2020-08-27 ENCOUNTER — Other Ambulatory Visit: Payer: Self-pay | Admitting: Internal Medicine

## 2020-08-27 DIAGNOSIS — R911 Solitary pulmonary nodule: Secondary | ICD-10-CM

## 2020-08-28 ENCOUNTER — Other Ambulatory Visit: Payer: Self-pay

## 2020-08-28 ENCOUNTER — Ambulatory Visit
Admission: RE | Admit: 2020-08-28 | Discharge: 2020-08-28 | Disposition: A | Discharge status: Home / Self Care | Payer: Medicare HMO | Source: Ambulatory Visit | Attending: Internal Medicine | Admitting: Internal Medicine

## 2020-08-28 DIAGNOSIS — R911 Solitary pulmonary nodule: Secondary | ICD-10-CM

## 2020-09-04 ENCOUNTER — Other Ambulatory Visit: Payer: Medicare HMO

## 2020-09-05 ENCOUNTER — Ambulatory Visit: Payer: Medicare HMO | Admitting: Internal Medicine

## 2020-09-10 ENCOUNTER — Telehealth: Payer: Self-pay

## 2020-09-10 NOTE — Telephone Encounter (Signed)
Left a message that the pt's prolia medication has been delivered to the office.

## 2020-09-16 ENCOUNTER — Telehealth: Payer: Self-pay

## 2020-09-16 NOTE — Telephone Encounter (Signed)
Letter completed.

## 2020-09-17 ENCOUNTER — Other Ambulatory Visit: Payer: Self-pay

## 2020-09-17 ENCOUNTER — Ambulatory Visit (INDEPENDENT_AMBULATORY_CARE_PROVIDER_SITE_OTHER): Payer: Medicare HMO | Admitting: Internal Medicine

## 2020-09-17 ENCOUNTER — Encounter: Payer: Self-pay | Admitting: Internal Medicine

## 2020-09-17 VITALS — BP 120/78 | HR 96 | Temp 98.5°F | Ht 64.0 in | Wt 204.0 lb

## 2020-09-17 DIAGNOSIS — N1831 Chronic kidney disease, stage 3a: Secondary | ICD-10-CM | POA: Diagnosis not present

## 2020-09-17 DIAGNOSIS — Z79899 Other long term (current) drug therapy: Secondary | ICD-10-CM

## 2020-09-17 DIAGNOSIS — M8580 Other specified disorders of bone density and structure, unspecified site: Secondary | ICD-10-CM

## 2020-09-17 DIAGNOSIS — I129 Hypertensive chronic kidney disease with stage 1 through stage 4 chronic kidney disease, or unspecified chronic kidney disease: Secondary | ICD-10-CM

## 2020-09-17 DIAGNOSIS — F5101 Primary insomnia: Secondary | ICD-10-CM

## 2020-09-17 MED ORDER — DENOSUMAB 60 MG/ML ~~LOC~~ SOSY
60.0000 mg | PREFILLED_SYRINGE | Freq: Once | SUBCUTANEOUS | Status: AC
  Administered 2020-09-17: 60 mg via SUBCUTANEOUS

## 2020-09-17 MED ORDER — TEMAZEPAM 15 MG PO CAPS: ORAL_CAPSULE | ORAL | 1 refills | Status: DC

## 2020-09-17 NOTE — Patient Instructions (Signed)
Osteopenia  Osteopenia is a loss of thickness (density) inside the bones. Another name for osteopenia is low bone mass. Mild osteopenia is a normal part of aging. It is not a disease, and it does not cause symptoms. However, if you have osteopenia and continue to lose bone mass, you could develop a condition that causes the bones to become thin and break more easily (osteoporosis). Osteoporosis can cause you to lose some height, have back pain, and have a stooped posture. Although osteopenia is not a disease, making changes to your lifestyle and diet can help to prevent osteopenia from developing into osteoporosis. What are the causes? Osteopenia is caused by loss of calcium in the bones. Bones are constantly changing. Old bone cells are continually being replaced with new bone cells. This process builds new bone. The mineral calcium is needed to build new bone and maintain bone density. Bone density is usually highest around age 35. After that, most people's bodies cannot replace all the bone they have lost with new bone. What increases the risk? You are more likely to develop this condition if:  You are older than age 50.  You are a woman who went through menopause early.  You have a long illness that keeps you in bed.  You do not get enough exercise.  You lack certain nutrients (malnutrition).  You have an overactive thyroid gland (hyperthyroidism).  You use products that contain nicotine or tobacco, such as cigarettes, e-cigarettes and chewing tobacco, or you drink a lot of alcohol.  You are taking medicines that weaken the bones, such as steroids. What are the signs or symptoms? This condition does not cause any symptoms. You may have a slightly higher risk for bone breaks (fractures), so getting fractures more easily than normal may be an indication of osteopenia. How is this diagnosed? This condition may be diagnosed based on an X-ray exam that measures bone density (dual-energy  X-ray absorptiometry, or DEXA). This test can measure bone density in your hips, spine, and wrists. Osteopenia has no symptoms, so this condition is usually diagnosed after a routine bone density screening test is done for osteoporosis. This routine screening is usually done for:  Women who are age 65 or older.  Men who are age 70 or older. If you have risk factors for osteopenia, you may have the screening test at an earlier age. How is this treated? Making dietary and lifestyle changes can lower your risk for osteoporosis. If you have severe osteopenia that is close to becoming osteoporosis, this condition can be treated with medicines and dietary supplements such as calcium and vitamin D. These supplements help to rebuild bone density. Follow these instructions at home: Eating and drinking Eat a diet that is high in calcium and vitamin D.  Calcium is found in dairy products, beans, salmon, and leafy green vegetables like spinach and broccoli.  Look for foods that have vitamin D and calcium added to them (fortified foods), such as orange juice, cereal, and bread.   Lifestyle  Do 30 minutes or more of a weight-bearing exercise every day, such as walking, jogging, or playing a sport. These types of exercises strengthen the bones.  Do not use any products that contain nicotine or tobacco, such as cigarettes, e-cigarettes, and chewing tobacco. If you need help quitting, ask your health care provider.  Do not drink alcohol if: ? Your health care provider tells you not to drink. ? You are pregnant, may be pregnant, or are planning to become   pregnant.  If you drink alcohol: ? Limit how much you use to:  0-1 drink a day for women.  0-2 drinks a day for men. ? Be aware of how much alcohol is in your drink. In the [place] one drink equals one 12 oz bottle of beer (355 mL), one 5 oz glass of wine (148 mL), or one 1 oz glass of hard liquor (44 mL). General instructions  Take over-the-counter  and prescription medicines only as told by your health care provider. These include vitamins and supplements.  Take precautions at home to lower your risk of falling, such as: ? Keeping rooms well-lit and free of clutter, such as cords. ? Installing safety rails on stairs. ? Using rubber mats in the bathroom or other areas that are often wet or slippery.  Keep all follow-up visits. This is important. Contact a health care provider if:  You have not had a bone density screening for osteoporosis and you are: ? A woman who is age 65 or older. ? A man who is age 70 or older.  You are a postmenopausal woman who has not had a bone density screening for osteoporosis.  You are older than age 50 and you want to know if you should have bone density screening for osteoporosis. Summary  Osteopenia is a loss of thickness (density) inside the bones. Another name for osteopenia is low bone mass.  Osteopenia is not a disease, but it may increase your risk for a condition that causes the bones to become thin and break more easily (osteoporosis).  You may be at risk for osteopenia if you are older than age 50 or if you are a woman who went through early menopause.  Osteopenia does not cause any symptoms, but it can be diagnosed with a bone density screening test.  Dietary and lifestyle changes are the first treatment for osteopenia. These may lower your risk for osteoporosis. This information is not intended to replace advice given to you by your health care provider. Make sure you discuss any questions you have with your health care provider. Document Revised: 09/28/2019 Document Reviewed: 09/28/2019 Elsevier Patient Education  2021 Elsevier Inc.  

## 2020-09-17 NOTE — Progress Notes (Signed)
I,[name],acting as a Education administrator for Maximino Greenland, MD.,have documented all relevant documentation on the behalf of Maximino Greenland, MD,as directed by  Maximino Greenland, MD while in the presence of Maximino Greenland, MD.  This visit occurred during the SARS-CoV-2 public health emergency.  Safety protocols were in place, including screening questions prior to the visit, additional usage of staff PPE, and extensive cleaning of exam room while observing appropriate contact time as indicated for disinfecting solutions.  Subjective:     Patient ID: Rachel [name] , female    DOB: June 21, 1950 , 70 y.o.   MRN: [id number]   Chief Complaint  Patient presents with   prolia injection   Medication Refill    temazepam    HPI  The patient is here today for a Prolia injection as part of her treatment for osteoporosis.  Her most recent bone density studies have improved to osteopenia. She has not had any side effects from previous injections.   Additionally, she would like refill of temazepam. Has h/o insomnia. Does not feel groggy the following morning when on meds.     Past Medical History:  Diagnosis Date   Abnormal mammogram    Brain aneurysm 2005   GERD (gastroesophageal reflux disease)    Hypertension    Hypothyroidism    Osteoporosis    Thyroid disease      Family History  Problem Relation Age of Onset   Cancer Mother        Breast    Hypertension Mother    Cancer Father        Prostate   Hypertension Father    Cancer Sister        Breast     Current Outpatient Medications:    calcium carbonate (OSCAL) 1500 (600 Ca) MG TABS tablet, Take 600 mg of elemental calcium by mouth daily with breakfast., Disp: , Rfl:    calcium carbonate (TUMS - DOSED IN MG ELEMENTAL CALCIUM) 500 MG chewable tablet, Chew 4 tablets by mouth 2 (two) times daily as needed for indigestion or heartburn., Disp: , Rfl:    Cholecalciferol (VITAMIN D3) 5000 units CAPS, Take 5,000 Units by mouth daily. ,  Disp: , Rfl:    COMBIGAN 0.2-0.5 % ophthalmic solution, Place 1 drop into the right eye 2 (two) times daily., Disp: , Rfl: 6   denosumab (PROLIA) 60 MG/ML SOSY injection, Inject 60 mg into the skin every 6 (six) months., Disp: 60 mL, Rfl: 1   Magnesium 250 MG TABS, Take 250 mg by mouth daily., Disp: , Rfl:    simvastatin (ZOCOR) 40 MG tablet, TAKE 1 TABLET BY MOUTH EVERY DAY (Patient taking differently: Take 40 mg by mouth daily at 6 PM.), Disp: 90 tablet, Rfl: 2   solifenacin (VESICARE) 5 MG tablet, Take 5 mg by mouth daily., Disp: , Rfl:    SYNTHROID 100 MCG tablet, Take 1 tablet (100 mcg total) by mouth daily before breakfast., Disp: 90 tablet, Rfl: 1   terbinafine (LAMISIL) 250 MG tablet, Take 1 tablet (250 mg total) by mouth daily., Disp: 90 tablet, Rfl: 0   valsartan-hydrochlorothiazide (DIOVAN-HCT) 80-12.5 MG tablet, TAKE 1 TABLET BY MOUTH EVERY DAY (Patient taking differently: Take 1 tablet by mouth daily.), Disp: 90 tablet, Rfl: 1   levothyroxine (SYNTHROID) 88 MCG tablet, Take 1 tablet (88 mcg total) by mouth daily before breakfast. Monday - Saturday, Disp: 90 tablet, Rfl: 0   ondansetron (ZOFRAN ODT) 4 MG disintegrating tablet, Take 1 tablet (  4 mg total) by mouth every 8 (eight) hours as needed for nausea or vomiting. (Patient not taking: No sig reported), Disp: 20 tablet, Rfl: 0   pantoprazole (PROTONIX) 40 MG tablet, TAKE 1 TABLET BY MOUTH ONCE A DAY (Patient not taking: No sig reported), Disp: 90 tablet, Rfl: 2   temazepam (RESTORIL) 15 MG capsule, TAKE 1 CAPSULE BY MOUTH EVERY DAY AT BEDTIME AS NEEDED, Disp: 30 capsule, Rfl: 1   Allergies  Allergen Reactions   Dilantin [Phenytoin Sodium Extended] Rash     Review of Systems  Constitutional: Negative.   Respiratory: Negative.    Cardiovascular: Negative.   Gastrointestinal: Negative.   Psychiatric/Behavioral:  Positive for sleep disturbance.   All other systems reviewed and are negative.   Today's Vitals   09/17/20 1504  BP:  120/78  Pulse: 96  Temp: 98.5 F (36.9 C)  TempSrc: Oral  Weight: 204 lb (92.5 kg)  Height: $Remove'5\' 4"'xmkAtIu$  (1.626 m)   Body mass index is 35.02 kg/m.  Wt Readings from Last 3 Encounters:  09/17/20 204 lb (92.5 kg)  08/20/20 204 lb 6.4 oz (92.7 kg)  08/16/20 202 lb 8 oz (91.9 kg)   BP Readings from Last 3 Encounters:  09/17/20 120/78  08/20/20 122/78  08/16/20 118/72   Objective:  Physical Exam Vitals and nursing note reviewed.  Constitutional:      Appearance: Normal appearance.  HENT:     Head: Normocephalic and atraumatic.     Nose:     Comments: Masked     Mouth/Throat:     Comments: Masked  Cardiovascular:     Rate and Rhythm: Normal rate and regular rhythm.     Heart sounds: Normal heart sounds.  Pulmonary:     Effort: Pulmonary effort is normal.     Breath sounds: Normal breath sounds.  Skin:    General: Skin is warm.  Neurological:     General: No focal deficit present.     Mental Status: She is alert.  Psychiatric:        Mood and Affect: Mood normal.        Behavior: Behavior normal.        Assessment And Plan:     1. Osteopenia, unspecified location Comments: Case d/w Rheum - advised she needs to stay on Prolia indefinitely. Calcium level reviewed, advised to c/w calcium/vit D supplementation. F/u in 6 months.  - denosumab (PROLIA) injection 60 mg  2. Primary insomnia Comments: Chronic,encouraged to develop bedtime routine.  She was also given refill temazepam to use nightly prn.  - temazepam (RESTORIL) 15 MG capsule; TAKE 1 CAPSULE BY MOUTH EVERY DAY AT BEDTIME AS NEEDED  Dispense: 30 capsule; Refill: 1  3. Hypertensive nephropathy Comments: Chronic, well controlled. She is encouraged to follow low sodium diet.   4. Stage 3a chronic kidney disease ([place]) Comments: Importance of adequate hydration and optimal BP control to decrease progression of CKD was d/w patient.   5. Drug therapy - BMP8+EGFR - ALT   Patient was given opportunity to ask  questions. Patient verbalized understanding of the plan and was able to repeat key elements of the plan. All questions were answered to their satisfaction.   I, Maximino Greenland, MD, have reviewed all documentation for this visit. The documentation on 10/12/20 for the exam, diagnosis, procedures, and orders are all accurate and complete.   IF YOU HAVE BEEN REFERRED TO A SPECIALIST, IT MAY TAKE 1-2 WEEKS TO SCHEDULE/PROCESS THE REFERRAL. IF YOU HAVE NOT  HEARD FROM US/SPECIALIST IN TWO WEEKS, PLEASE GIVE [place] A CALL AT [phone].   THE PATIENT IS ENCOURAGED TO PRACTICE SOCIAL DISTANCING DUE TO THE COVID-19 PANDEMIC.

## 2020-09-18 ENCOUNTER — Telehealth: Payer: Self-pay | Admitting: Internal Medicine

## 2020-09-18 LAB — ALT: ALT: 10 IU/L (ref 0–32)

## 2020-09-18 LAB — BMP8+EGFR
BUN/Creatinine Ratio: 17 (ref 12–28)
BUN: 19 mg/dL (ref 8–27)
CO2: 24 mmol/L (ref 20–29)
Calcium: 10.3 mg/dL (ref 8.7–10.3)
Chloride: 103 mmol/L (ref 96–106)
Creatinine, Ser: 1.15 mg/dL — ABNORMAL HIGH (ref 0.57–1.00)
Glucose: 104 mg/dL — ABNORMAL HIGH (ref 65–99)
Potassium: 4.3 mmol/L (ref 3.5–5.2)
Sodium: 142 mmol/L (ref 134–144)
eGFR: 52 mL/min/{1.73_m2} — ABNORMAL LOW (ref >59)

## 2020-09-18 NOTE — Telephone Encounter (Signed)
I have verified Rx pick up as follows:  Synthroid 100 mcg  Taking 1 tablet (100 mcg) daily before breakfast  Patient just wanted to make sure since it is different on the AVS. I have inform patient that it has change since lab results.

## 2020-09-18 NOTE — Telephone Encounter (Signed)
Patient called stating the directions on her synthroid when she picked it up said something different than the directions given to her at her last visit. Patient asked to be called back at [phone] for clarification.

## 2020-09-25 ENCOUNTER — Ambulatory Visit: Payer: Medicare HMO | Admitting: Internal Medicine

## 2020-10-04 ENCOUNTER — Other Ambulatory Visit: Payer: Self-pay | Admitting: Internal Medicine

## 2020-10-11 ENCOUNTER — Other Ambulatory Visit (INDEPENDENT_AMBULATORY_CARE_PROVIDER_SITE_OTHER): Payer: Medicare HMO

## 2020-10-11 ENCOUNTER — Other Ambulatory Visit: Payer: Self-pay

## 2020-10-11 DIAGNOSIS — E039 Hypothyroidism, unspecified: Secondary | ICD-10-CM

## 2020-10-11 LAB — TSH: TSH: 0.3 u[IU]/mL — ABNORMAL LOW (ref 0.35–4.50)

## 2020-10-12 ENCOUNTER — Ambulatory Visit
Admission: RE | Admit: 2020-10-12 | Discharge: 2020-10-12 | Disposition: A | Discharge status: Home / Self Care | Payer: Medicare HMO | Source: Ambulatory Visit | Attending: Internal Medicine | Admitting: Internal Medicine

## 2020-10-12 DIAGNOSIS — R911 Solitary pulmonary nodule: Secondary | ICD-10-CM

## 2020-10-12 DIAGNOSIS — F5101 Primary insomnia: Secondary | ICD-10-CM | POA: Insufficient documentation

## 2020-10-12 DIAGNOSIS — N1831 Chronic kidney disease, stage 3a: Secondary | ICD-10-CM | POA: Insufficient documentation

## 2020-10-12 DIAGNOSIS — M8580 Other specified disorders of bone density and structure, unspecified site: Secondary | ICD-10-CM | POA: Insufficient documentation

## 2020-10-23 ENCOUNTER — Encounter: Payer: Self-pay | Admitting: Nurse Practitioner

## 2020-10-23 ENCOUNTER — Ambulatory Visit (INDEPENDENT_AMBULATORY_CARE_PROVIDER_SITE_OTHER): Payer: Medicare HMO | Admitting: Nurse Practitioner

## 2020-10-23 ENCOUNTER — Other Ambulatory Visit: Payer: Self-pay

## 2020-10-23 VITALS — BP 128/70 | HR 81 | Temp 97.8°F | Ht 64.0 in | Wt 201.2 lb

## 2020-10-23 DIAGNOSIS — N3001 Acute cystitis with hematuria: Secondary | ICD-10-CM | POA: Diagnosis not present

## 2020-10-23 DIAGNOSIS — R82998 Other abnormal findings in urine: Secondary | ICD-10-CM | POA: Diagnosis not present

## 2020-10-23 LAB — POCT URINALYSIS DIPSTICK
Bilirubin, UA: NEGATIVE
Glucose, UA: NEGATIVE
Ketones, UA: NEGATIVE
Nitrite, UA: POSITIVE
Protein, UA: POSITIVE — AB
Spec Grav, UA: 1.02 (ref 1.010–1.025)
Urobilinogen, UA: 0.2 E.U./dL
pH, UA: 6 (ref 5.0–8.0)

## 2020-10-23 MED ORDER — NITROFURANTOIN MONOHYD MACRO 100 MG PO CAPS
100.0000 mg | ORAL_CAPSULE | Freq: Two times a day (BID) | ORAL | 0 refills | Status: AC
Start: 2020-10-23 — End: 2020-10-30

## 2020-10-23 NOTE — Patient Instructions (Signed)

## 2020-10-23 NOTE — Progress Notes (Signed)
I,Tianna Badgett,acting as a Education administrator for [employer], NP.,have documented all relevant documentation on the behalf of [employer], NP,as directed by  Bary Castilla, NP while in the presence of Bary Castilla, NP.  This visit occurred during the SARS-CoV-2 public health emergency.  Safety protocols were in place, including screening questions prior to the visit, additional usage of staff PPE, and extensive cleaning of exam room while observing appropriate contact time as indicated for disinfecting solutions.  Subjective:     Patient ID: Rachel [name] , female    DOB: 02-01-1951 , 70 y.o.   MRN: [id number]   Chief Complaint  Patient presents with   Dysuria    HPI  Patient presents with complaints of urgency, and frequency of urination. Symptoms started couple of days ago. Denies fever. No nausea, vomiting, diarrhea or abdominal pain. Denies CVA tenderness or hematuria.      Dysuria  Associated symptoms include frequency. Pertinent negatives include no chills, flank pain or urgency.    Past Medical History:  Diagnosis Date   Abnormal mammogram    Brain aneurysm 2005   GERD (gastroesophageal reflux disease)    Hypertension    Hypothyroidism    Osteoporosis    Thyroid disease      Family History  Problem Relation Age of Onset   Cancer Mother        Breast    Hypertension Mother    Cancer Father        Prostate   Hypertension Father    Cancer Sister        Breast     Current Outpatient Medications:    nitrofurantoin, macrocrystal-monohydrate, (MACROBID) 100 MG capsule, Take 1 capsule (100 mg total) by mouth 2 (two) times daily for 7 days., Disp: 14 capsule, Rfl: 0   calcium carbonate (OSCAL) 1500 (600 Ca) MG TABS tablet, Take 600 mg of elemental calcium by mouth daily with breakfast., Disp: , Rfl:    calcium carbonate (TUMS - DOSED IN MG ELEMENTAL CALCIUM) 500 MG chewable tablet, Chew 4 tablets by mouth 2 (two) times daily as needed for indigestion or  heartburn., Disp: , Rfl:    Cholecalciferol (VITAMIN D3) 5000 units CAPS, Take 5,000 Units by mouth daily. , Disp: , Rfl:    COMBIGAN 0.2-0.5 % ophthalmic solution, Place 1 drop into the right eye 2 (two) times daily., Disp: , Rfl: 6   denosumab (PROLIA) 60 MG/ML SOSY injection, Inject 60 mg into the skin every 6 (six) months., Disp: 60 mL, Rfl: 1   levothyroxine (SYNTHROID) 88 MCG tablet, Take 1 tablet (88 mcg total) by mouth daily before breakfast. Monday - Saturday, Disp: 90 tablet, Rfl: 0   Magnesium 250 MG TABS, Take 250 mg by mouth daily., Disp: , Rfl:    ondansetron (ZOFRAN ODT) 4 MG disintegrating tablet, Take 1 tablet (4 mg total) by mouth every 8 (eight) hours as needed for nausea or vomiting. (Patient not taking: No sig reported), Disp: 20 tablet, Rfl: 0   pantoprazole (PROTONIX) 40 MG tablet, TAKE 1 TABLET BY MOUTH ONCE A DAY (Patient not taking: No sig reported), Disp: 90 tablet, Rfl: 2   simvastatin (ZOCOR) 40 MG tablet, TAKE 1 TABLET BY MOUTH EVERY DAY (Patient taking differently: Take 40 mg by mouth daily at 6 PM.), Disp: 90 tablet, Rfl: 2   solifenacin (VESICARE) 5 MG tablet, Take 5 mg by mouth daily., Disp: , Rfl:    SYNTHROID 100 MCG tablet, Take 1 tablet (100 mcg total) by mouth  daily before breakfast., Disp: 90 tablet, Rfl: 1   temazepam (RESTORIL) 15 MG capsule, TAKE 1 CAPSULE BY MOUTH EVERY DAY AT BEDTIME AS NEEDED, Disp: 30 capsule, Rfl: 1   terbinafine (LAMISIL) 250 MG tablet, Take 1 tablet (250 mg total) by mouth daily., Disp: 90 tablet, Rfl: 0   valsartan-hydrochlorothiazide (DIOVAN-HCT) 80-12.5 MG tablet, TAKE 1 TABLET BY MOUTH EVERY DAY (Patient taking differently: Take 1 tablet by mouth daily.), Disp: 90 tablet, Rfl: 1   Allergies  Allergen Reactions   Dilantin [Phenytoin Sodium Extended] Rash     Review of Systems  Constitutional: Negative.  Negative for chills and fever.  Respiratory: Negative.    Cardiovascular: Negative.  Negative for chest pain and  palpitations.  Gastrointestinal: Negative.   Endocrine: Negative for polydipsia, polyphagia and polyuria.  Genitourinary:  Positive for dysuria and frequency. Negative for flank pain, pelvic pain and urgency.  Neurological: Negative.  Negative for dizziness, numbness and headaches.    Today's Vitals   10/23/20 1011  BP: 128/70  Pulse: 81  Temp: 97.8 F (36.6 C)  TempSrc: Oral  Weight: 201 lb 3.2 oz (91.3 kg)  Height: 5\' 4"  (1.626 m)   Body mass index is 34.54 kg/m.   Objective:  Physical Exam Constitutional:      Appearance: Normal appearance.  HENT:     Head: Normocephalic and atraumatic.  Cardiovascular:     Rate and Rhythm: Normal rate and regular rhythm.     Pulses: Normal pulses.     Heart sounds: Normal heart sounds. No murmur heard. Pulmonary:     Effort: Pulmonary effort is normal. No respiratory distress.     Breath sounds: Normal breath sounds.  Skin:    General: Skin is warm and dry.     Capillary Refill: Capillary refill takes less than 2 seconds.  Neurological:     Mental Status: She is alert and oriented to person, place, and time.        Assessment And Plan:     1. Acute cystitis with hematuria - nitrofurantoin, macrocrystal-monohydrate, (MACROBID) 100 MG capsule; Take 1 capsule (100 mg total) by mouth 2 (two) times daily for 7 days.  Dispense: 14 capsule; Refill: 0 - POCT Urinalysis Dipstick (81002)   2. Leukocytes in urine - Culture, Urine  -Will send urine for culture    Prescription sent to pharmacy. Discussed medication desired effects, side effects, and how to administer medication. Urine sent for culture. Make take OTC medications as needed. Increase oral fluid intake. Follow up for worsening or persistent symptoms. Patient verbalized understanding regarding plan of care and all questions answered.   Follow up: if symptoms persist or do not get better.   Side effects and appropriate use of all the medication(s) were discussed with the  patient today. Patient advised to use the medication(s) as directed by their healthcare provider. The patient was encouraged to read, review, and understand all associated package inserts and contact our office with any questions or concerns. The patient accepts the risks of the treatment plan and had an opportunity to ask questions.   The patient was encouraged to call or send a message through [place] for any questions or concerns.    Patient was given opportunity to ask questions. Patient verbalized understanding of the plan and was able to repeat key elements of the plan. All questions were answered to their satisfaction.  Raman Faustino Luecke, DNP   I, Raman Ojas Coone have reviewed all documentation for this visit. The documentation on 10/23/20  for the exam, diagnosis, procedures, and orders are all accurate and complete.    IF YOU HAVE BEEN REFERRED TO A SPECIALIST, IT MAY TAKE 1-2 WEEKS TO SCHEDULE/PROCESS THE REFERRAL. IF YOU HAVE NOT HEARD FROM US/SPECIALIST IN TWO WEEKS, PLEASE GIVE [place] A CALL AT [phone].   THE PATIENT IS ENCOURAGED TO PRACTICE SOCIAL DISTANCING DUE TO THE COVID-19 PANDEMIC.

## 2020-10-26 ENCOUNTER — Other Ambulatory Visit: Payer: Self-pay | Admitting: Internal Medicine

## 2020-10-27 LAB — URINE CULTURE

## 2020-11-07 ENCOUNTER — Other Ambulatory Visit: Payer: Self-pay

## 2020-11-07 ENCOUNTER — Other Ambulatory Visit: Payer: Medicare HMO

## 2020-11-07 DIAGNOSIS — N3001 Acute cystitis with hematuria: Secondary | ICD-10-CM

## 2020-11-11 LAB — URINE CULTURE

## 2020-11-13 ENCOUNTER — Other Ambulatory Visit: Payer: Self-pay | Admitting: Nurse Practitioner

## 2020-11-13 DIAGNOSIS — N390 Urinary tract infection, site not specified: Secondary | ICD-10-CM

## 2020-11-13 DIAGNOSIS — B9620 Unspecified Escherichia coli [E. coli] as the cause of diseases classified elsewhere: Secondary | ICD-10-CM

## 2020-11-13 MED ORDER — CIPROFLOXACIN HCL 250 MG PO TABS: 250.0000 mg | ORAL_TABLET | Freq: Two times a day (BID) | ORAL | 0 refills | Status: AC

## 2020-11-26 LAB — HM MAMMOGRAPHY

## 2020-11-27 ENCOUNTER — Ambulatory Visit: Payer: Medicare HMO | Admitting: Podiatry

## 2020-11-28 ENCOUNTER — Encounter: Payer: Self-pay | Admitting: Internal Medicine

## 2020-12-02 ENCOUNTER — Ambulatory Visit (INDEPENDENT_AMBULATORY_CARE_PROVIDER_SITE_OTHER): Payer: Medicare HMO | Admitting: Podiatry

## 2020-12-02 ENCOUNTER — Other Ambulatory Visit: Payer: Self-pay

## 2020-12-02 ENCOUNTER — Other Ambulatory Visit: Payer: Self-pay | Admitting: Internal Medicine

## 2020-12-02 ENCOUNTER — Telehealth: Payer: Self-pay | Admitting: Internal Medicine

## 2020-12-02 DIAGNOSIS — B351 Tinea unguium: Secondary | ICD-10-CM | POA: Diagnosis not present

## 2020-12-02 DIAGNOSIS — M79674 Pain in right toe(s): Secondary | ICD-10-CM | POA: Diagnosis not present

## 2020-12-02 DIAGNOSIS — I10 Essential (primary) hypertension: Secondary | ICD-10-CM

## 2020-12-02 DIAGNOSIS — M79675 Pain in left toe(s): Secondary | ICD-10-CM

## 2020-12-02 NOTE — Telephone Encounter (Signed)
Left message asking pt to call me @ [phone]  Patient has AWV scheudle 04/03/21  I wanted to see if patient would schedule sooner   Awvs can be schedule calendar year per [facility]

## 2020-12-09 NOTE — Progress Notes (Signed)
   HPI: 70 y.o. female presenting today as a new patient for follow-up evaluation of onychomycosis to the bilateral great toes.  Patient states the left foot is doing very well.  She continues to have some thickening and discoloration to the right foot overall.  She presents for further treatment and evaluation.  She is unable to trim her own toenails.  She has pain and sensitivity in close toed shoes.  Past Medical History:  Diagnosis Date   Abnormal mammogram    Brain aneurysm 2005   GERD (gastroesophageal reflux disease)    Hypertension    Hypothyroidism    Osteoporosis    Thyroid disease      Physical Exam: General: The patient is alert and oriented x3 in no acute distress.  Dermatology: Skin is warm, dry and supple bilateral lower extremities. Negative for open lesions or macerations.  Hyperkeratotic, dystrophic, discolored nails noted to the right foot, most significantly the right hallux.  Findings consistent with onychomycosis.  The base of the nails actually appear to be growing out nice and healthy.  It appears that the Lamisil is working and he correcting the onychomycosis of the toenails  Vascular: Palpable pedal pulses bilaterally. No edema or erythema noted. Capillary refill within normal limits.  Neurological: Epicritic and protective threshold grossly intact bilaterally.   Musculoskeletal Exam: Range of motion within normal limits to all pedal and ankle joints bilateral. Muscle strength 5/5 in all groups bilateral.   Assessment: 1.  Onychomycosis of toenails right foot   Plan of Care:  1. Patient evaluated. 2.  Mechanical debridement of nails 1-5 right foot was performed using nail nipper without incident or bleeding to 3.  Return to clinic as needed  *Referred by [place]. She is the grandmother to [place]'s twin boys.      Edrick Kins, DPM [facility]  Dr. Edrick Kins, DPM    [address], [place] [address]                Office [phone]  Fax [phone]

## 2020-12-19 ENCOUNTER — Ambulatory Visit: Payer: Medicare HMO

## 2020-12-20 ENCOUNTER — Other Ambulatory Visit: Payer: Self-pay

## 2020-12-20 ENCOUNTER — Ambulatory Visit (INDEPENDENT_AMBULATORY_CARE_PROVIDER_SITE_OTHER): Payer: Medicare HMO | Admitting: Internal Medicine

## 2020-12-20 ENCOUNTER — Encounter: Payer: Self-pay | Admitting: Internal Medicine

## 2020-12-20 VITALS — BP 126/74 | HR 72 | Ht 64.0 in | Wt 199.0 lb

## 2020-12-20 DIAGNOSIS — E039 Hypothyroidism, unspecified: Secondary | ICD-10-CM

## 2020-12-20 LAB — T4, FREE: Free T4: 1.63 ng/dL — ABNORMAL HIGH (ref 0.60–1.60)

## 2020-12-20 LAB — TSH: TSH: 0.07 u[IU]/mL — ABNORMAL LOW (ref 0.35–5.50)

## 2020-12-20 NOTE — Progress Notes (Signed)
Name: Rachel [name]  MRN/ DOB: [id number], [birth year]    Age/ Sex: 70 y.o., female     PCP: Glendale Chard, MD   Reason for Endocrinology Evaluation: Hypothyroidism     Initial Endocrinology Clinic Visit: 08/16/2020    PATIENT IDENTIFIER: Rachel [name] is a 70 y.o., female with a past medical history of HTN and pulmonary fibrosis . She has followed with [facility] since 08/16/2020 for consultative assistance with management of her Hypothyroidism.   HISTORICAL SUMMARY:   She has been diagnosed with hypothyroidism years ago. Has been on Lt-4 ever since.    Sister with thyroid disease  SUBJECTIVE:     Today (12/20/2020):  Rachel [name] is here for hypothyroidism.  She has been noted with weight loss  She has cold intolerance  She continue with exercise and eating healthy  Denies local neck symptoms Denies constipation or diarrhea  Has good energy level   Uses a pill box for synthroid and takes it appropriately  No biotin intake    Synthroid 100 mcg , half on 'Sundays and 1 tablet the rest of the week     HISTORY:  Past Medical History:  Past Medical History:  Diagnosis Date   Abnormal mammogram    Brain aneurysm 2005   GERD (gastroesophageal reflux disease)    Hypertension    Hypothyroidism    Osteoporosis    Thyroid disease    Past Surgical History:  Past Surgical History:  Procedure Laterality Date   BRAIN SURGERY     20'$ 05    DR CABBELL    CLOSED REDUCTION NASAL FRACTURE N/A 06/21/2020   Procedure: CLOSED REDUCTION NASAL FRACTURE;  Surgeon: Jason Coop, DO;  Location: [facility];  Service: ENT;  Laterality: N/A;   COLONOSCOPY     LAPAROSCOPIC BILATERAL SALPINGO OOPHERECTOMY Bilateral 11/12/2015   Procedure: LAPAROSCOPIC BILATERAL SALPINGO OOPHORECTOMY;  Surgeon: Everett Graff, MD;  Location: [facility];  Service: Gynecology;  Laterality: Bilateral;   LAPAROSCOPY N/A 11/22/2015   Procedure: LAPAROSCOPY DIAGNOSTIC with repair of serosal  tear;  Surgeon: Everett Graff, MD;  Location: [facility];  Service: Gynecology;  Laterality: N/A;   RADIOACTIVE SEED GUIDED EXCISIONAL BREAST BIOPSY Left 09/26/2015   Procedure: LEFT RADIOACTIVE SEED GUIDED EXCISIONAL BREAST BIOPSY;  Surgeon: Stark Klein, MD;  Location: [facility];  Service: General;  Laterality: Left;   RADIOACTIVE SEED GUIDED EXCISIONAL BREAST BIOPSY Left 02/20/2020   Procedure: LEFT RADIOACTIVE SEED GUIDED EXCISIONAL BREAST BIOPSY;  Surgeon: Stark Klein, MD;  Location: [facility];  Service: General;  Laterality: Left;  RNFA   WISDOM TOOTH EXTRACTION     Social History:  reports that she quit smoking about 16 years ago. Her smoking use included cigarettes. She started smoking about 49 years ago. She has a 30.00 pack-year smoking history. She has never used smokeless tobacco. She reports that she does not drink alcohol and does not use drugs. Family History:  Family History  Problem Relation Age of Onset   Cancer Mother        Breast    Hypertension Mother    Cancer Father        Prostate   Hypertension Father    Cancer Sister        Breast     HOME MEDICATIONS: Allergies as of 12/20/2020       Reactions   Dilantin [phenytoin Sodium Extended] Rash        Medication List  Accurate as of December 20, 2020  2:09 PM. If you have any questions, ask your nurse or doctor.          calcium carbonate 1500 (600 Ca) MG Tabs tablet Commonly known as: OSCAL Take 600 mg of elemental calcium by mouth daily with breakfast.   calcium carbonate 500 MG chewable tablet Commonly known as: TUMS - dosed in mg elemental calcium Chew 4 tablets by mouth 2 (two) times daily as needed for indigestion or heartburn.   Combigan 0.2-0.5 % ophthalmic solution Generic drug: brimonidine-timolol Place 1 drop into the right eye 2 (two) times daily.   denosumab 60 MG/ML Sosy injection Commonly known as: PROLIA Inject 60 mg into the skin every 6 (six) months.    Magnesium 250 MG Tabs Take 250 mg by mouth daily.   ondansetron 4 MG disintegrating tablet Commonly known as: Zofran ODT Take 1 tablet (4 mg total) by mouth every 8 (eight) hours as needed for nausea or vomiting.   pantoprazole 40 MG tablet Commonly known as: PROTONIX TAKE 1 TABLET BY MOUTH ONCE A DAY   simvastatin 40 MG tablet Commonly known as: ZOCOR TAKE 1 TABLET BY MOUTH EVERY DAY   solifenacin 5 MG tablet Commonly known as: VESICARE Take 5 mg by mouth daily.   Synthroid 100 MCG tablet Generic drug: levothyroxine Take 1 tablet (100 mcg total) by mouth daily before breakfast.   levothyroxine 88 MCG tablet Commonly known as: Synthroid Take 1 tablet (88 mcg total) by mouth daily before breakfast. Monday - Saturday   temazepam 15 MG capsule Commonly known as: RESTORIL TAKE 1 CAPSULE BY MOUTH EVERY DAY AT BEDTIME AS NEEDED   terbinafine 250 MG tablet Commonly known as: LamISIL Take 1 tablet (250 mg total) by mouth daily.   valsartan-hydrochlorothiazide 80-12.5 MG tablet Commonly known as: DIOVAN-HCT TAKE 1 TABLET BY MOUTH EVERY DAY   Vitamin D3 125 MCG (5000 UT) Caps Take 5,000 Units by mouth daily.          OBJECTIVE:   PHYSICAL EXAM: VS: BP 126/74   Pulse 72   Ht '5\' 4"'$  (1.626 m)   Wt 199 lb (90.3 kg)   SpO2 94%   BMI 34.16 kg/m    EXAM: General: Pt appears well and is in NAD  Neck: General: Supple without adenopathy. Thyroid: Thyroid size normal.  No goiter or nodules appreciated.   Lungs: Clear with good BS bilat with no rales, rhonchi, or wheezes  Heart: Auscultation: RRR.  Abdomen: Normoactive bowel sounds, soft, nontender, without masses or organomegaly palpable  Extremities:  BL LE: No pretibial edema normal ROM and strength.  Mental Status: Judgment, insight: Intact Orientation: Oriented to time, place, and person Memory: Intact for recent and remote events Mood and affect: No depression, anxiety, or agitation     DATA  REVIEWED:  Results for LAURI, WALTENBAUGH (MRN [id number]) as of 12/23/2020 12:22  Ref. Range 12/20/2020 14:34  TSH Latest Ref Range: 0.35 - 5.50 uIU/mL 0.07 (L)  T4,Free(Direct) Latest Ref Range: 0.60 - 1.60 ng/dL 1.63 (H)     ASSESSMENT / PLAN / RECOMMENDATIONS:   Hypothyroidism:   -Patient is biochemically hyperthyroid, I am going to reduce her Synthroid dose as below    Medications   Stop Synthroid 100 Start Synthroid 88 MCG daily    Labs in 8 weeks  Signed electronically by: Mack Guise, MD  [facility] End[facility]in[facility]  [address] [place], [place] [address] Phone: [phone]  [id number]      CC: Glendale Chard, [address] [place] [address] Phone: [phone]  Fax: [phone]   Return to Endocrinology clinic as below: Future Appointments  Date Time Provider [place]  12/20/2020  2:20 PM Tristy Udovich, Melanie Crazier, MD [facility] None  03/26/2021 10:30 AM Glendale Chard, MD [facility] None  03/27/2021  2:30 PM [facility] None  03/27/2021  3:00 PM Glendale Chard, MD [facility] None  01/14/2022 11:40 AM Glendale Chard, MD [facility] None

## 2020-12-20 NOTE — Patient Instructions (Signed)
You are on Synthroid- which is your thyroid hormone supplement. You MUST take this consistently.  You should take this first thing in the morning on an empty stomach with water. You should not take it with other medications. Wait 30 min to 1hr prior to eating. If you are taking any vitamins - please take these in the evening.   If you miss a dose, please take your missed dose the following day (double the dose for that day). You should have a pill box for ONLY Synthroid on your bedside table to help you remember to take your medications.

## 2020-12-23 MED ORDER — LEVOTHYROXINE SODIUM 88 MCG PO TABS: 88.0000 ug | ORAL_TABLET | Freq: Every day | ORAL | 2 refills | Status: DC

## 2021-01-23 LAB — HM PAP SMEAR: HM Pap smear: ABNORMAL

## 2021-01-27 ENCOUNTER — Encounter: Payer: Self-pay | Admitting: Internal Medicine

## 2021-02-17 ENCOUNTER — Other Ambulatory Visit: Payer: Self-pay | Admitting: Internal Medicine

## 2021-03-26 ENCOUNTER — Encounter: Payer: Self-pay | Admitting: Internal Medicine

## 2021-03-26 ENCOUNTER — Other Ambulatory Visit: Payer: Self-pay

## 2021-03-26 ENCOUNTER — Ambulatory Visit (INDEPENDENT_AMBULATORY_CARE_PROVIDER_SITE_OTHER): Payer: Medicare HMO | Admitting: Internal Medicine

## 2021-03-26 ENCOUNTER — Ambulatory Visit: Payer: Medicare HMO

## 2021-03-26 VITALS — BP 122/60 | HR 66 | Temp 97.8°F | Ht 62.0 in | Wt 193.6 lb

## 2021-03-26 DIAGNOSIS — Z0000 Encounter for general adult medical examination without abnormal findings: Secondary | ICD-10-CM | POA: Diagnosis not present

## 2021-03-26 DIAGNOSIS — N1831 Chronic kidney disease, stage 3a: Secondary | ICD-10-CM | POA: Diagnosis not present

## 2021-03-26 DIAGNOSIS — M8580 Other specified disorders of bone density and structure, unspecified site: Secondary | ICD-10-CM

## 2021-03-26 DIAGNOSIS — Z23 Encounter for immunization: Secondary | ICD-10-CM

## 2021-03-26 DIAGNOSIS — Z6835 Body mass index (BMI) 35.0-35.9, adult: Secondary | ICD-10-CM

## 2021-03-26 DIAGNOSIS — E039 Hypothyroidism, unspecified: Secondary | ICD-10-CM | POA: Diagnosis not present

## 2021-03-26 DIAGNOSIS — I129 Hypertensive chronic kidney disease with stage 1 through stage 4 chronic kidney disease, or unspecified chronic kidney disease: Secondary | ICD-10-CM

## 2021-03-26 DIAGNOSIS — R7309 Other abnormal glucose: Secondary | ICD-10-CM | POA: Diagnosis not present

## 2021-03-26 LAB — POCT URINALYSIS DIPSTICK
Bilirubin, UA: NEGATIVE
Glucose, UA: NEGATIVE
Ketones, UA: NEGATIVE
Leukocytes, UA: NEGATIVE
Nitrite, UA: NEGATIVE
Protein, UA: POSITIVE — AB
Spec Grav, UA: 1.025 (ref 1.010–1.025)
Urobilinogen, UA: 0.2 E.U./dL
pH, UA: 5.5 (ref 5.0–8.0)

## 2021-03-26 LAB — POCT UA - MICROALBUMIN
Creatinine, POC: 300 mg/dL
Microalbumin Ur, POC: 80 mg/L

## 2021-03-26 MED ORDER — SHINGRIX 50 MCG/0.5ML IM SUSR: 0.5000 mL | Freq: Once | INTRAMUSCULAR | 0 refills | Status: AC

## 2021-03-26 MED ORDER — DENOSUMAB 60 MG/ML ~~LOC~~ SOSY
60.0000 mg | PREFILLED_SYRINGE | Freq: Once | SUBCUTANEOUS | Status: AC
  Administered 2021-03-26: 60 mg via SUBCUTANEOUS

## 2021-03-26 NOTE — Patient Instructions (Signed)

## 2021-03-26 NOTE — Progress Notes (Signed)
I,Tianna Badgett,acting as a Education administrator for Maximino Greenland, MD.,have documented all relevant documentation on the behalf of Maximino Greenland, MD,as directed by  Maximino Greenland, MD while in the presence of Maximino Greenland, MD.  This visit occurred during the SARS-CoV-2 public health emergency.  Safety protocols were in place, including screening questions prior to the visit, additional usage of staff PPE, and extensive cleaning of exam room while observing appropriate contact time as indicated for disinfecting solutions.  Subjective:     Patient ID: Rachel [name] , female    DOB: 09/26/1950 , 70 y.o.   MRN: [id number]   Chief Complaint  Patient presents with   Annual Exam   Hypertension    HPI  The patient is here today for a physical exam.  She reports compliance with meds. She denies headaches, chest pain and shortness of breath.  She will receive her Prolia injection today.   Hypertension This is a chronic problem. The current episode started more than 1 year ago. The problem has been gradually improving since onset. The problem is controlled. Pertinent negatives include no blurred vision, chest pain, palpitations or shortness of breath. Risk factors for coronary artery disease include dyslipidemia, obesity, post-menopausal state and sedentary lifestyle. The current treatment provides moderate improvement. Identifiable causes of hypertension include a thyroid problem.  Thyroid Problem Presents for follow-up visit. Patient reports no cold intolerance, constipation, palpitations or visual change. The symptoms have been stable.    Past Medical History:  Diagnosis Date   Abnormal mammogram    Brain aneurysm 2005   GERD (gastroesophageal reflux disease)    Hypertension    Hypothyroidism    Osteoporosis    Thyroid disease      Family History  Problem Relation Age of Onset   Cancer Mother        Breast    Hypertension Mother    Cancer Father        Prostate   Hypertension Father     Cancer Sister        Breast     Current Outpatient Medications:    calcium carbonate (OSCAL) 1500 (600 Ca) MG TABS tablet, Take 600 mg of elemental calcium by mouth daily with breakfast., Disp: , Rfl:    calcium carbonate (TUMS - DOSED IN MG ELEMENTAL CALCIUM) 500 MG chewable tablet, Chew 4 tablets by mouth 2 (two) times daily as needed for indigestion or heartburn., Disp: , Rfl:    Cholecalciferol (VITAMIN D3) 5000 units CAPS, Take 5,000 Units by mouth daily. , Disp: , Rfl:    COMBIGAN 0.2-0.5 % ophthalmic solution, Place 1 drop into the right eye 2 (two) times daily., Disp: , Rfl: 6   denosumab (PROLIA) 60 MG/ML SOSY injection, Inject 60 mg into the skin every 6 (six) months., Disp: 60 mL, Rfl: 1   levothyroxine (SYNTHROID) 88 MCG tablet, Take 1 tablet (88 mcg total) by mouth daily before breakfast., Disp: 90 tablet, Rfl: 2   Magnesium 250 MG TABS, Take 250 mg by mouth daily., Disp: , Rfl:    pantoprazole (PROTONIX) 40 MG tablet, TAKE 1 TABLET BY MOUTH ONCE A DAY, Disp: 90 tablet, Rfl: 2   simvastatin (ZOCOR) 40 MG tablet, TAKE 1 TABLET BY MOUTH EVERY DAY, Disp: 90 tablet, Rfl: 2   solifenacin (VESICARE) 5 MG tablet, Take 5 mg by mouth daily., Disp: , Rfl:    temazepam (RESTORIL) 15 MG capsule, TAKE 1 CAPSULE BY MOUTH EVERY DAY AT BEDTIME AS NEEDED, Disp:  30 capsule, Rfl: 1   terbinafine (LAMISIL) 250 MG tablet, Take 1 tablet (250 mg total) by mouth daily., Disp: 90 tablet, Rfl: 0   valsartan-hydrochlorothiazide (DIOVAN-HCT) 80-12.5 MG tablet, TAKE 1 TABLET BY MOUTH EVERY DAY, Disp: 90 tablet, Rfl: 1   Allergies  Allergen Reactions   Dilantin [Phenytoin Sodium Extended] Rash    The patient states she uses post menopausal status for birth control. Last LMP was No LMP recorded. Patient is postmenopausal.. Negative for Dysmenorrhea. Negative for: breast discharge, breast lump(s), breast pain and breast self exam. Associated symptoms include abnormal vaginal bleeding. Pertinent negatives  include abnormal bleeding (hematology), anxiety, decreased libido, depression, difficulty falling sleep, dyspareunia, history of infertility, nocturia, sexual dysfunction, sleep disturbances, urinary incontinence, urinary urgency, vaginal discharge and vaginal itching. Diet regular.The patient states her exercise level is  moderate.   . The patient's tobacco use is:  Social History   Tobacco Use  Smoking Status Former   Packs/day: 1.00   Years: 30.00   Pack years: 30.00   Types: Cigarettes   Start date: 75   Quit date: 04/02/2004   Years since quitting: 17.0  Smokeless Tobacco Never  . She has been exposed to passive smoke. The patient's alcohol use is:  Social History   Substance and Sexual Activity  Alcohol Use No   Review of Systems  Constitutional: Negative.   HENT: Negative.    Eyes: Negative.  Negative for blurred vision.  Respiratory: Negative.  Negative for shortness of breath.   Cardiovascular: Negative.  Negative for chest pain and palpitations.  Gastrointestinal: Negative.  Negative for constipation.  Endocrine: Negative.  Negative for cold intolerance.  Genitourinary: Negative.   Musculoskeletal: Negative.   Skin: Negative.   Allergic/Immunologic: Negative.   Neurological: Negative.   Hematological: Negative.   Psychiatric/Behavioral: Negative.      Today's Vitals   03/26/21 1054  BP: 122/60  Pulse: 66  Temp: 97.8 F (36.6 C)  TempSrc: Oral  Weight: 193 lb 9.6 oz (87.8 kg)  Height: $Remove'5\' 2"'sZRvmgK$  (1.575 m)   Body mass index is 35.41 kg/m.  Wt Readings from Last 3 Encounters:  03/26/21 193 lb 9.6 oz (87.8 kg)  12/20/20 199 lb (90.3 kg)  10/23/20 201 lb 3.2 oz (91.3 kg)    Objective:  Physical Exam Vitals and nursing note reviewed.  Constitutional:      Appearance: Normal appearance.  HENT:     Head: Normocephalic and atraumatic.     Right Ear: Tympanic membrane, ear canal and external ear normal.     Left Ear: Tympanic membrane, ear canal and external  ear normal.     Nose:     Comments: Masked     Mouth/Throat:     Comments: Masked  Eyes:     Extraocular Movements: Extraocular movements intact.     Conjunctiva/sclera: Conjunctivae normal.     Pupils: Pupils are equal, round, and reactive to light.  Cardiovascular:     Rate and Rhythm: Normal rate and regular rhythm.     Pulses: Normal pulses.     Heart sounds: Normal heart sounds.  Pulmonary:     Effort: Pulmonary effort is normal.     Breath sounds: Normal breath sounds.  Chest:  Breasts:    Tanner Score is 5.     Right: Normal.     Left: Normal.  Abdominal:     General: Bowel sounds are normal.     Palpations: Abdomen is soft.  Genitourinary:    Comments: deferred  Musculoskeletal:        General: Normal range of motion.     Cervical back: Normal range of motion and neck supple.  Skin:    General: Skin is warm and dry.  Neurological:     General: No focal deficit present.     Mental Status: She is alert and oriented to person, place, and time.  Psychiatric:        Mood and Affect: Mood normal.        Behavior: Behavior normal.        Assessment And Plan:     1. Encounter for annual physical exam Comments: A full exam was performed.  Importance of monthly self breast exams was discussed with the patient. PATIENT IS ADVISED TO GET 30-45 MINUTES REGULAR EXERCISE NO LESS THAN FOUR TO FIVE DAYS PER WEEK - BOTH WEIGHTBEARING EXERCISES AND AEROBIC ARE RECOMMENDED.  PATIENT IS ADVISED TO FOLLOW A HEALTHY DIET WITH AT LEAST SIX FRUITS/VEGGIES PER DAY, DECREASE INTAKE OF RED MEAT, AND TO INCREASE FISH INTAKE TO TWO DAYS PER WEEK.  MEATS/FISH SHOULD NOT BE FRIED, BAKED OR BROILED IS PREFERABLE.  IT IS ALSO IMPORTANT TO CUT BACK ON YOUR SUGAR INTAKE. PLEASE AVOID ANYTHING WITH ADDED SUGAR, CORN SYRUP OR OTHER SWEETENERS. IF YOU MUST USE A SWEETENER, YOU CAN TRY STEVIA. IT IS ALSO IMPORTANT TO AVOID ARTIFICIALLY SWEETENERS AND DIET BEVERAGES. LASTLY, I SUGGEST WEARING SPF 50  SUNSCREEN ON EXPOSED PARTS AND ESPECIALLY WHEN IN THE DIRECT SUNLIGHT FOR AN EXTENDED PERIOD OF TIME.  PLEASE AVOID FAST FOOD RESTAURANTS AND INCREASE YOUR WATER INTAKE.  2. Hypertensive nephropathy Comments: Chronic, well controlled. EKG performed, NSR w/o acute changes. No med changes. Advised to follow low sodium diet. She agrees to f/u in six months for re-evaluation.  - POCT Urinalysis Dipstick (81002) - POCT UA - Microalbumin - EKG 12-Lead - CBC - CMP14+EGFR - Lipid panel  3. Stage 3a chronic kidney disease ([place]) Comments: Chronic, I will check renal function today. Encouraged to stay well hydrated and keep BP well controlled. I will check Renal labs today as well.   4. Acquired hypothyroidism Comments: Chronic, she is now followed by Endo, their input is appreciated. Notes reviewed, no med changes today.  - TSH + free T4  5. Osteopenia, unspecified location Comments: Her bone density has improved with use of Prolia, this will be administered today. She agrees to c/w calcium supplementation and f/uin 6 months.  - denosumab (PROLIA) injection 60 mg  6. Other abnormal glucose Comments: Her a1c has been elevated in the past. I will recheck this today.  She is encouraged to limit her intake of sweetened beverages and refined carbs.  - Hemoglobin A1c  7. Class 2 severe obesity due to excess calories with serious comorbidity and body mass index (BMI) of 35.0 to 35.9 in adult [facility]) Comments: She is encouraged to strive for BMI less than 30 to decrease cardiac risk. Advised to aim for at least 150 minutes of exercise per week.   8. Need for influenza vaccination Comments: She was given high dose flu vaccine.  - Flu Vaccine QUAD High Dose(Fluad)  Patient was given opportunity to ask questions. Patient verbalized understanding of the plan and was able to repeat key elements of the plan. All questions were answered to their satisfaction.   I, Maximino Greenland, MD, have reviewed all  documentation for this visit. The documentation on 04/02/21 for the exam, diagnosis, procedures, and orders are all accurate and complete.  THE PATIENT IS ENCOURAGED TO PRACTICE SOCIAL DISTANCING DUE TO THE COVID-19 PANDEMIC.

## 2021-03-27 ENCOUNTER — Ambulatory Visit: Payer: Medicare HMO

## 2021-03-27 ENCOUNTER — Ambulatory Visit: Payer: Medicare HMO | Admitting: Internal Medicine

## 2021-03-27 LAB — CMP14+EGFR
ALT: 12 IU/L (ref 0–32)
AST: 21 IU/L (ref 0–40)
Albumin/Globulin Ratio: 1.5 (ref 1.2–2.2)
Albumin: 4.4 g/dL (ref 3.8–4.8)
Alkaline Phosphatase: 56 IU/L (ref 44–121)
BUN/Creatinine Ratio: 15 (ref 12–28)
BUN: 17 mg/dL (ref 8–27)
Bilirubin Total: 0.7 mg/dL (ref 0.0–1.2)
CO2: 24 mmol/L (ref 20–29)
Calcium: 10.4 mg/dL — ABNORMAL HIGH (ref 8.7–10.3)
Chloride: 102 mmol/L (ref 96–106)
Creatinine, Ser: 1.17 mg/dL — ABNORMAL HIGH (ref 0.57–1.00)
Globulin, Total: 3 g/dL (ref 1.5–4.5)
Glucose: 83 mg/dL (ref 70–99)
Potassium: 4.5 mmol/L (ref 3.5–5.2)
Sodium: 142 mmol/L (ref 134–144)
Total Protein: 7.4 g/dL (ref 6.0–8.5)
eGFR: 51 mL/min/{1.73_m2} — ABNORMAL LOW (ref >59)

## 2021-03-27 LAB — TSH+FREE T4
Free T4: 1.86 ng/dL — ABNORMAL HIGH (ref 0.82–1.77)
TSH: 1.73 u[IU]/mL (ref 0.450–4.500)

## 2021-03-27 LAB — CBC
Hematocrit: 48.7 % — ABNORMAL HIGH (ref 34.0–46.6)
Hemoglobin: 15.8 g/dL (ref 11.1–15.9)
MCH: 31.9 pg (ref 26.6–33.0)
MCHC: 32.4 g/dL (ref 31.5–35.7)
MCV: 98 fL — ABNORMAL HIGH (ref 79–97)
Platelets: 230 10*3/uL (ref 150–450)
RBC: 4.96 x10E6/uL (ref 3.77–5.28)
RDW: 12.4 % (ref 11.7–15.4)
WBC: 5.6 10*3/uL (ref 3.4–10.8)

## 2021-03-27 LAB — LIPID PANEL
Chol/HDL Ratio: 3.7 ratio (ref 0.0–4.4)
Cholesterol, Total: 183 mg/dL (ref 100–199)
HDL: 50 mg/dL (ref >39)
LDL Chol Calc (NIH): 117 mg/dL — ABNORMAL HIGH (ref 0–99)
Triglycerides: 89 mg/dL (ref 0–149)
VLDL Cholesterol Cal: 16 mg/dL (ref 5–40)

## 2021-03-27 LAB — HEMOGLOBIN A1C
Est. average glucose Bld gHb Est-mCnc: 126 mg/dL
Hgb A1c MFr Bld: 6 % — ABNORMAL HIGH (ref 4.8–5.6)

## 2021-04-03 ENCOUNTER — Ambulatory Visit: Payer: Medicare HMO

## 2021-04-07 ENCOUNTER — Other Ambulatory Visit: Payer: Self-pay

## 2021-04-08 ENCOUNTER — Other Ambulatory Visit: Payer: Self-pay | Admitting: Internal Medicine

## 2021-04-08 DIAGNOSIS — N1831 Chronic kidney disease, stage 3a: Secondary | ICD-10-CM

## 2021-04-08 DIAGNOSIS — F5101 Primary insomnia: Secondary | ICD-10-CM

## 2021-04-08 DIAGNOSIS — I129 Hypertensive chronic kidney disease with stage 1 through stage 4 chronic kidney disease, or unspecified chronic kidney disease: Secondary | ICD-10-CM

## 2021-04-08 MED ORDER — TEMAZEPAM 15 MG PO CAPS: ORAL_CAPSULE | ORAL | 1 refills | Status: DC

## 2021-04-09 ENCOUNTER — Ambulatory Visit (INDEPENDENT_AMBULATORY_CARE_PROVIDER_SITE_OTHER): Payer: Medicare HMO

## 2021-04-09 VITALS — Ht 64.0 in | Wt 188.0 lb

## 2021-04-09 DIAGNOSIS — Z0000 Encounter for general adult medical examination without abnormal findings: Secondary | ICD-10-CM | POA: Diagnosis not present

## 2021-04-09 NOTE — Progress Notes (Signed)
I connected with Rachel [name] today by telephone and verified that I am speaking with the correct person using two identifiers. Location patient: home Location provider: work Persons participating in the virtual visit: Chaselyn, Nanney LPN.   I discussed the limitations, risks, security and privacy concerns of performing an evaluation and management service by telephone and the availability of in person appointments. I also discussed with the patient that there may be a patient responsible charge related to this service. The patient expressed understanding and verbally consented to this telephonic visit.    Interactive audio and video telecommunications were attempted between this provider and patient, however failed, due to patient having technical difficulties OR patient did not have access to video capability.  We continued and completed visit with audio only.     Vital signs may be patient reported or missing.  Subjective:   Rachel [name] is a 70 y.o. female who presents for Medicare Annual (Subsequent) preventive examination.  Review of Systems     Cardiac Risk Factors include: advanced age (>68men, >76 women);hypertension;obesity (BMI >30kg/m2)     Objective:    Today's Vitals   04/09/21 1401  Weight: 188 lb (85.3 kg)  Height: 5\' 4"  (1.626 m)   Body mass index is 32.27 kg/m.  Advanced Directives 04/09/2021 06/21/2020 06/13/2020 03/06/2020 02/20/2020 02/13/2020 03/01/2019  Does Patient Have a Medical Advance Directive? No No No No No No No  Does patient want to make changes to medical advance directive? - - No - Patient declined - - - -  Would patient like information on creating a medical advance directive? - No - Patient declined No - Patient declined No - Patient declined No - Patient declined No - Patient declined -    Current Medications (verified) Outpatient Encounter Medications as of 04/09/2021  Medication Sig   calcium carbonate (OSCAL) 1500 (600  Ca) MG TABS tablet Take 600 mg of elemental calcium by mouth daily with breakfast.   calcium carbonate (TUMS - DOSED IN MG ELEMENTAL CALCIUM) 500 MG chewable tablet Chew 4 tablets by mouth 2 (two) times daily as needed for indigestion or heartburn.   Cholecalciferol (VITAMIN D3) 5000 units CAPS Take 5,000 Units by mouth daily.    COMBIGAN 0.2-0.5 % ophthalmic solution Place 1 drop into the right eye 2 (two) times daily.   denosumab (PROLIA) 60 MG/ML SOSY injection Inject 60 mg into the skin every 6 (six) months.   famotidine (PEPCID) 40 MG tablet Take 40 mg by mouth 2 (two) times daily.   Ferrous Gluconate (IRON 27 PO) Take 1 tablet by mouth daily.   levothyroxine (SYNTHROID) 88 MCG tablet Take 1 tablet (88 mcg total) by mouth daily before breakfast.   Magnesium 250 MG TABS Take 250 mg by mouth daily.   simvastatin (ZOCOR) 40 MG tablet TAKE 1 TABLET BY MOUTH EVERY DAY   solifenacin (VESICARE) 5 MG tablet Take 5 mg by mouth daily.   temazepam (RESTORIL) 15 MG capsule TAKE 1 CAPSULE BY MOUTH EVERY DAY AT BEDTIME AS NEEDED   valsartan-hydrochlorothiazide (DIOVAN-HCT) 80-12.5 MG tablet TAKE 1 TABLET BY MOUTH EVERY DAY   pantoprazole (PROTONIX) 40 MG tablet TAKE 1 TABLET BY MOUTH ONCE A DAY (Patient not taking: Reported on 04/09/2021)   terbinafine (LAMISIL) 250 MG tablet Take 1 tablet (250 mg total) by mouth daily. (Patient not taking: Reported on 04/09/2021)   No facility-administered encounter medications on file as of 04/09/2021.    Allergies (verified) Dilantin [phenytoin sodium extended]  History: Past Medical History:  Diagnosis Date   Abnormal mammogram    Brain aneurysm 2005   GERD (gastroesophageal reflux disease)    Hypertension    Hypothyroidism    Osteoporosis    Thyroid disease    Past Surgical History:  Procedure Laterality Date   BRAIN SURGERY     2005    DR CABBELL    CLOSED REDUCTION NASAL FRACTURE N/A 06/21/2020   Procedure: CLOSED REDUCTION NASAL FRACTURE;   Surgeon: Jason Coop, DO;  Location: [facility];  Service: ENT;  Laterality: N/A;   COLONOSCOPY     LAPAROSCOPIC BILATERAL SALPINGO OOPHERECTOMY Bilateral 11/12/2015   Procedure: LAPAROSCOPIC BILATERAL SALPINGO OOPHORECTOMY;  Surgeon: Everett Graff, MD;  Location: [facility];  Service: Gynecology;  Laterality: Bilateral;   LAPAROSCOPY N/A 11/22/2015   Procedure: LAPAROSCOPY DIAGNOSTIC with repair of serosal tear;  Surgeon: Everett Graff, MD;  Location: [facility];  Service: Gynecology;  Laterality: N/A;   RADIOACTIVE SEED GUIDED EXCISIONAL BREAST BIOPSY Left 09/26/2015   Procedure: LEFT RADIOACTIVE SEED GUIDED EXCISIONAL BREAST BIOPSY;  Surgeon: Stark Klein, MD;  Location: [facility];  Service: General;  Laterality: Left;   RADIOACTIVE SEED GUIDED EXCISIONAL BREAST BIOPSY Left 02/20/2020   Procedure: LEFT RADIOACTIVE SEED GUIDED EXCISIONAL BREAST BIOPSY;  Surgeon: Stark Klein, MD;  Location: [facility];  Service: General;  Laterality: Left;  RNFA   WISDOM TOOTH EXTRACTION     Family History  Problem Relation Age of Onset   Cancer Mother        Breast    Hypertension Mother    Cancer Father        Prostate   Hypertension Father    Cancer Sister        Breast   Social History   Socioeconomic History   Marital status: Married    Spouse name: Not on file   Number of children: Not on file   Years of education: Not on file   Highest education level: Not on file  Occupational History   Occupation: retired  Tobacco Use   Smoking status: Former    Packs/day: 1.00    Years: 30.00    Pack years: 30.00    Types: Cigarettes    Start date: 1973    Quit date: 04/02/2004    Years since quitting: 17.0   Smokeless tobacco: Never  Vaping Use   Vaping Use: Never used  Substance and Sexual Activity   Alcohol use: No   Drug use: No   Sexual activity: Not Currently    Birth control/protection: Post-menopausal    Comment: bil  salpingo-oopherectomy  Other Topics Concern   Not on file   Social History Narrative   Not on file   Social Determinants of Health   Financial Resource Strain: Low Risk    Difficulty of Paying Living Expenses: Not hard at all  Food Insecurity: No Food Insecurity   Worried About Charity fundraiser in the Last Year: Never true   Arboriculturist in the Last Year: Never true  Transportation Needs: No Transportation Needs   Lack of Transportation (Medical): No   Lack of Transportation (Non-Medical): No  Physical Activity: Sufficiently Active   Days of Exercise per Week: 5 days   Minutes of Exercise per Session: 30 min  Stress: No Stress Concern Present   Feeling of Stress : Not at all  Social Connections: Not on file    Tobacco Counseling Counseling given: Not Answered   Clinical  Intake:  Pre-visit preparation completed: Yes  Pain : No/denies pain     Nutritional Status: BMI > 30  Obese Nutritional Risks: None Diabetes: No  How often do you need to have someone help you when you read instructions, pamphlets, or other written materials from your doctor or pharmacy?: 1 - Never What is the last grade level you completed in school?: 14 yrs  Diabetic? no  Interpreter Needed?: No  Information entered by :: NAllen LPN   Activities of Daily Living In your present state of health, do you have any difficulty performing the following activities: 04/09/2021 06/21/2020  Hearing? N -  Vision? N -  Difficulty concentrating or making decisions? N -  Walking or climbing stairs? Y -  Dressing or bathing? N -  Doing errands, shopping? N N  Preparing Food and eating ? N -  Using the Toilet? N -  In the past six months, have you accidently leaked urine? N -  Do you have problems with loss of bowel control? N -  Managing your Medications? N -  Managing your Finances? N -  Housekeeping or managing your Housekeeping? N -  Some recent data might be hidden    Patient Care Team: Glendale Chard, MD as PCP - General (Internal  Medicine)  Indicate any recent Medical Services you may have received from other than [facility] providers in the past year (date may be approximate).     Assessment:   This is a routine wellness examination for Rachel [name].  Hearing/Vision screen Vision Screening - Comments:: Regular eye exams, Dr. Venetia Maxon  Dietary issues and exercise activities discussed: Current Exercise Habits: Home exercise routine, Type of exercise: treadmill, Time (Minutes): 30, Frequency (Times/Week): 5, Weekly Exercise (Minutes/Week): 150   Goals Addressed             This Visit's Progress    Patient Stated       04/09/2021, continue to lose weight       Depression Screen PHQ 2/9 Scores 04/09/2021 03/26/2021 03/06/2020 03/01/2019 08/31/2018 02/25/2018  PHQ - 2 Score 0 0 0 0 0 0  PHQ- 9 Score - 0 - - - -    Fall Risk Fall Risk  04/09/2021 08/20/2020 03/06/2020 03/01/2019 03/01/2019  Falls in the past year? 1 1 0 0 0  Comment fell on uneven pavement - - - -  Number falls in past yr: 0 0 - 0 -  Comment - tripped on uneven pavement - - -  Injury with Fall? 1 1 - - -  Comment broke nose broke her nose - - -  Risk for fall due to : Medication side effect - Medication side effect Medication side effect -  Follow up Falls evaluation completed;Education provided;Falls prevention discussed - Falls evaluation completed;Education provided;Falls prevention discussed Falls evaluation completed;Education provided;Falls prevention discussed -    FALL RISK PREVENTION PERTAINING TO THE HOME:  Any stairs in or around the home? No  If so, are there any without handrails? N/a Home free of loose throw rugs in walkways, pet beds, electrical cords, etc? Yes  Adequate lighting in your home to reduce risk of falls? Yes   ASSISTIVE DEVICES UTILIZED TO PREVENT FALLS:  Life alert? No  Use of a cane, walker or w/c? No  Grab bars in the bathroom? No  Shower chair or bench in shower? No  Elevated toilet seat or a handicapped toilet?  Yes   TIMED UP AND GO:  Was the test performed? No .  Cognitive Function:     6CIT Screen 04/09/2021 03/06/2020 03/01/2019  What Year? 0 points 0 points 0 points  What month? 0 points 0 points 0 points  What time? 0 points 0 points 0 points  Count back from 20 0 points 0 points 0 points  Months in reverse 0 points 2 points 0 points  Repeat phrase 0 points 2 points 4 points  Total Score 0 4 4    Immunizations Immunization History  Administered Date(s) Administered   Fluad Quad(high Dose 65+) 03/06/2020, 03/26/2021   Influenza, High Dose Seasonal PF 02/25/2018, 03/01/2019   Influenza-Unspecified 01/25/2017   PFIZER Comirnaty(Gray Top)Covid-19 Tri-Sucrose Vaccine 10/02/2020   PFIZER(Purple Top)SARS-COV-2 Vaccination 06/03/2019, 06/28/2019, 01/30/2020, 10/02/2020   Pneumococcal Conjugate-13 03/22/2013   Pneumococcal Polysaccharide-23 03/12/2020    TDAP status: Up to date  Flu Vaccine status: Up to date  Pneumococcal vaccine status: Up to date  Covid-19 vaccine status: Completed vaccines  Qualifies for Shingles Vaccine? Yes   Zostavax completed No   Shingrix Completed?: No.    Education has been provided regarding the importance of this vaccine. Patient has been advised to call insurance company to determine out of pocket expense if they have not yet received this vaccine. Advised may also receive vaccine at local pharmacy or Health Dept. Verbalized acceptance and understanding.  Screening Tests Health Maintenance  Topic Date Due   Zoster Vaccines- Shingrix (1 of 2) Never done   COVID-19 Vaccine (5 - Booster for Pfizer series) 11/27/2020   MAMMOGRAM  11/26/2021   PAP SMEAR-Modifier  01/23/2022   TETANUS/TDAP  11/12/2026   COLONOSCOPY (Pts 45-55yrs Insurance coverage will need to be confirmed)  03/16/2028   Pneumonia Vaccine 66+ Years old  Completed   INFLUENZA VACCINE  Completed   DEXA SCAN  Completed   Hepatitis C Screening  Completed   HPV VACCINES  Aged Out     Health Maintenance  Health Maintenance Due  Topic Date Due   Zoster Vaccines- Shingrix (1 of 2) Never done   COVID-19 Vaccine (5 - Booster for [place] series) 11/27/2020    Colorectal cancer screening: Type of screening: Colonoscopy. Completed 03/16/2018. Repeat every 5 years  Mammogram status: Completed 11/26/2020. Repeat every year  Bone Density status: Completed 12/13/2018. Results reflect: Bone density results: OSTEOPENIA. Repeat every 2 years.  Lung Cancer Screening: (Low Dose CT Chest recommended if Age 47-80 years, 30 pack-year currently smoking OR have quit w/in 15years.) does not qualify.   Lung Cancer Screening Referral: no  Additional Screening:  Hepatitis C Screening: does qualify; Completed 02/25/2018  Vision Screening: Recommended annual ophthalmology exams for early detection of glaucoma and other disorders of the eye. Is the patient up to date with their annual eye exam?  Yes  Who is the provider or what is the name of the office in which the patient attends annual eye exams? Dr. Venetia Maxon If pt is not established with a provider, would they like to be referred to a provider to establish care? No .   Dental Screening: Recommended annual dental exams for proper oral hygiene  Community Resource Referral / Chronic Care Management: CRR required this visit?  No   CCM required this visit?  No      Plan:     I have personally reviewed and noted the following in the patients chart:   Medical and social history Use of alcohol, tobacco or illicit drugs  Current medications and supplements including opioid prescriptions.  Functional ability and status Nutritional status Physical  activity Advanced directives List of other physicians Hospitalizations, surgeries, and ER visits in previous 12 months Vitals Screenings to include cognitive, depression, and falls Referrals and appointments  In addition, I have reviewed and discussed with patient certain preventive  protocols, quality metrics, and best practice recommendations. A written personalized care plan for preventive services as well as general preventive health recommendations were provided to patient.     Kellie Simmering, LPN   [id number]   Nurse Notes: none

## 2021-04-09 NOTE — Patient Instructions (Signed)
Rachel [name] , Thank you for taking time to come for your Medicare Wellness Visit. I appreciate your ongoing commitment to your health goals. Please review the following plan we discussed and let me know if I can assist you in the future.   Screening recommendations/referrals: Colonoscopy: completed 05/16/2017 Mammogram: completed 11/26/2020 Bone Density: completed 12/13/2018 Recommended yearly ophthalmology/optometry visit for glaucoma screening and checkup Recommended yearly dental visit for hygiene and checkup  Vaccinations: Influenza vaccine: completed 03/26/2021 Pneumococcal vaccine: completed 03/12/2020 Tdap vaccine: completed 11/11/2016, due 11/12/2026 Shingles vaccine: discussed   Covid-19:10/02/2020, 01/30/2020, 06/28/2019, 06/03/2019  Advanced directives: Advance directive discussed with you today.   Conditions/risks identified: none  Next appointment: Follow up in one year for your annual wellness visit    Preventive Care 65 Years and Older, Female Preventive care refers to lifestyle choices and visits with your health care provider that can promote health and wellness. What does preventive care include? A yearly physical exam. This is also called an annual well check. Dental exams once or twice a year. Routine eye exams. Ask your health care provider how often you should have your eyes checked. Personal lifestyle choices, including: Daily care of your teeth and gums. Regular physical activity. Eating a healthy diet. Avoiding tobacco and drug use. Limiting alcohol use. Practicing safe sex. Taking low-dose aspirin every day. Taking vitamin and mineral supplements as recommended by your health care provider. What happens during an annual well check? The services and screenings done by your health care provider during your annual well check will depend on your age, overall health, lifestyle risk factors, and family history of disease. Counseling  Your health care provider may ask  you questions about your: Alcohol use. Tobacco use. Drug use. Emotional well-being. Home and relationship well-being. Sexual activity. Eating habits. History of falls. Memory and ability to understand (cognition). Work and work Statistician. Reproductive health. Screening  You may have the following tests or measurements: Height, weight, and BMI. Blood pressure. Lipid and cholesterol levels. These may be checked every 5 years, or more frequently if you are over 1 years old. Skin check. Lung cancer screening. You may have this screening every year starting at age 68 if you have a 30-pack-year history of smoking and currently smoke or have quit within the past 15 years. Fecal occult blood test (FOBT) of the stool. You may have this test every year starting at age 50. Flexible sigmoidoscopy or colonoscopy. You may have a sigmoidoscopy every 5 years or a colonoscopy every 10 years starting at age 58. Hepatitis C blood test. Hepatitis B blood test. Sexually transmitted disease (STD) testing. Diabetes screening. This is done by checking your blood sugar (glucose) after you have not eaten for a while (fasting). You may have this done every 1-3 years. Bone density scan. This is done to screen for osteoporosis. You may have this done starting at age 44. Mammogram. This may be done every 1-2 years. Talk to your health care provider about how often you should have regular mammograms. Talk with your health care provider about your test results, treatment options, and if necessary, the need for more tests. Vaccines  Your health care provider may recommend certain vaccines, such as: Influenza vaccine. This is recommended every year. Tetanus, diphtheria, and acellular pertussis (Tdap, Td) vaccine. You may need a Td booster every 10 years. Zoster vaccine. You may need this after age 71. Pneumococcal 13-valent conjugate (PCV13) vaccine. One dose is recommended after age 60. Pneumococcal  polysaccharide (PPSV23) vaccine. One  dose is recommended after age 29. Talk to your health care provider about which screenings and vaccines you need and how often you need them. This information is not intended to replace advice given to you by your health care provider. Make sure you discuss any questions you have with your health care provider. Document Released: 05/10/2015 Document Revised: 01/01/2016 Document Reviewed: 02/12/2015 Elsevier Interactive Patient Education  2017 [place] Prevention in the Home Falls can cause injuries. They can happen to people of all ages. There are many things you can do to make your home safe and to help prevent falls. What can I do on the outside of my home? Regularly fix the edges of walkways and driveways and fix any cracks. Remove anything that might make you trip as you walk through a door, such as a raised step or threshold. Trim any bushes or trees on the path to your home. Use bright outdoor lighting. Clear any walking paths of anything that might make someone trip, such as rocks or tools. Regularly check to see if handrails are loose or broken. Make sure that both sides of any steps have handrails. Any raised decks and porches should have guardrails on the edges. Have any leaves, snow, or ice cleared regularly. Use sand or salt on walking paths during winter. Clean up any spills in your garage right away. This includes oil or grease spills. What can I do in the bathroom? Use night lights. Install grab bars by the toilet and in the tub and shower. Do not use towel bars as grab bars. Use non-skid mats or decals in the tub or shower. If you need to sit down in the shower, use a plastic, non-slip stool. Keep the floor dry. Clean up any water that spills on the floor as soon as it happens. Remove soap buildup in the tub or shower regularly. Attach bath mats securely with double-sided non-slip rug tape. Do not have throw rugs and other  things on the floor that can make you trip. What can I do in the bedroom? Use night lights. Make sure that you have a light by your bed that is easy to reach. Do not use any sheets or blankets that are too big for your bed. They should not hang down onto the floor. Have a firm chair that has side arms. You can use this for support while you get dressed. Do not have throw rugs and other things on the floor that can make you trip. What can I do in the kitchen? Clean up any spills right away. Avoid walking on wet floors. Keep items that you use a lot in easy-to-reach places. If you need to reach something above you, use a strong step stool that has a grab bar. Keep electrical cords out of the way. Do not use floor polish or wax that makes floors slippery. If you must use wax, use non-skid floor wax. Do not have throw rugs and other things on the floor that can make you trip. What can I do with my stairs? Do not leave any items on the stairs. Make sure that there are handrails on both sides of the stairs and use them. Fix handrails that are broken or loose. Make sure that handrails are as long as the stairways. Check any carpeting to make sure that it is firmly attached to the stairs. Fix any carpet that is loose or worn. Avoid having throw rugs at the top or bottom of the stairs.  If you do have throw rugs, attach them to the floor with carpet tape. Make sure that you have a light switch at the top of the stairs and the bottom of the stairs. If you do not have them, ask someone to add them for you. What else can I do to help prevent falls? Wear shoes that: Do not have high heels. Have rubber bottoms. Are comfortable and fit you well. Are closed at the toe. Do not wear sandals. If you use a stepladder: Make sure that it is fully opened. Do not climb a closed stepladder. Make sure that both sides of the stepladder are locked into place. Ask someone to hold it for you, if possible. Clearly  mark and make sure that you can see: Any grab bars or handrails. First and last steps. Where the edge of each step is. Use tools that help you move around (mobility aids) if they are needed. These include: Canes. Walkers. Scooters. Crutches. Turn on the lights when you go into a dark area. Replace any light bulbs as soon as they burn out. Set up your furniture so you have a clear path. Avoid moving your furniture around. If any of your floors are uneven, fix them. If there are any pets around you, be aware of where they are. Review your medicines with your doctor. Some medicines can make you feel dizzy. This can increase your chance of falling. Ask your doctor what other things that you can do to help prevent falls. This information is not intended to replace advice given to you by your health care provider. Make sure you discuss any questions you have with your health care provider. Document Released: 02/07/2009 Document Revised: 09/19/2015 Document Reviewed: 05/18/2014 Elsevier Interactive Patient Education  2017 [employer].

## 2021-05-31 ENCOUNTER — Other Ambulatory Visit: Payer: Self-pay | Admitting: Internal Medicine

## 2021-05-31 DIAGNOSIS — I10 Essential (primary) hypertension: Secondary | ICD-10-CM

## 2021-06-10 ENCOUNTER — Encounter (HOSPITAL_COMMUNITY): Payer: Self-pay

## 2021-06-15 ENCOUNTER — Encounter: Payer: Self-pay | Admitting: Internal Medicine

## 2021-06-17 LAB — BASIC METABOLIC PANEL
BUN: 23 — AB (ref 4–21)
CO2: 22 (ref 13–22)
Chloride: 106 (ref 99–108)
Glucose: 88
Potassium: 4.8 (ref 3.4–5.3)
Sodium: 144 (ref 137–147)

## 2021-06-17 LAB — COMPREHENSIVE METABOLIC PANEL
Albumin: 4.3 (ref 3.5–5.0)
Calcium: 10.1 (ref 8.7–10.7)

## 2021-06-25 ENCOUNTER — Encounter: Payer: Self-pay | Admitting: Internal Medicine

## 2021-06-27 ENCOUNTER — Encounter: Payer: Self-pay | Admitting: Internal Medicine

## 2021-06-27 ENCOUNTER — Ambulatory Visit (INDEPENDENT_AMBULATORY_CARE_PROVIDER_SITE_OTHER): Payer: Medicare HMO | Admitting: Internal Medicine

## 2021-06-27 ENCOUNTER — Other Ambulatory Visit: Payer: Self-pay

## 2021-06-27 VITALS — BP 118/72 | HR 60 | Ht 64.0 in | Wt 194.6 lb

## 2021-06-27 DIAGNOSIS — E039 Hypothyroidism, unspecified: Secondary | ICD-10-CM | POA: Diagnosis not present

## 2021-06-27 LAB — TSH: TSH: 4.94 u[IU]/mL (ref 0.35–5.50)

## 2021-06-27 LAB — T4, FREE: Free T4: 0.98 ng/dL (ref 0.60–1.60)

## 2021-06-27 NOTE — Progress Notes (Signed)
? ?Name: Rachel [name]  ?MRN/ DOB: [id number], 17-May-1950    ?Age/ Sex: 71 y.o., female   ? ? ?PCP: Glendale Chard, MD   ?Reason for Endocrinology Evaluation: Hypothyroidism  ?   ?Initial Endocrinology Clinic Visit: 08/16/2020  ? ? ?PATIENT IDENTIFIER: Rachel [name] is a 71 y.o., female with a past medical history of HTN and pulmonary fibrosis . She has followed with [facility] since 08/16/2020 for consultative assistance with management of her Hypothyroidism.  ? ?HISTORICAL SUMMARY:  ? ?She has been diagnosed with hypothyroidism years ago. Has been on Lt-4 ever since.  ?  ?Sister with thyroid disease ? ?SUBJECTIVE:  ? ? ? ?Today (06/27/2021):  Rachel [name] is here for hypothyroidism. ? ?She has been noted with fluctuating weight  ? ?Denies constipation but has diarrhea  ?Denies local neck symptoms  ?Energy level is improving  ? ?Uses a pill box for synthroid and takes it appropriately  ?No biotin intake  ? ? ?Synthroid 88 mcg daily  ? ? ? ?HISTORY:  ?Past Medical History:  ?Past Medical History:  ?Diagnosis Date  ? Abnormal mammogram   ? Brain aneurysm 2005  ? GERD (gastroesophageal reflux disease)   ? Hypertension   ? Hypothyroidism   ? Osteoporosis   ? Thyroid disease   ? ?Past Surgical History:  ?Past Surgical History:  ?Procedure Laterality Date  ? BRAIN SURGERY    ? 2005    DR CABBELL   ? CLOSED REDUCTION NASAL FRACTURE N/A 06/21/2020  ? Procedure: CLOSED REDUCTION NASAL FRACTURE;  Surgeon: Jason Coop, DO;  Location: [facility];  Service: ENT;  Laterality: N/A;  ? COLONOSCOPY    ? LAPAROSCOPIC BILATERAL SALPINGO OOPHERECTOMY Bilateral 11/12/2015  ? Procedure: LAPAROSCOPIC BILATERAL SALPINGO OOPHORECTOMY;  Surgeon: Everett Graff, MD;  Location: [facility];  Service: Gynecology;  Laterality: Bilateral;  ? LAPAROSCOPY N/A 11/22/2015  ? Procedure: LAPAROSCOPY DIAGNOSTIC with repair of serosal tear;  Surgeon: Everett Graff, MD;  Location: [facility];  Service: Gynecology;  Laterality: N/A;  ?  RADIOACTIVE SEED GUIDED EXCISIONAL BREAST BIOPSY Left 09/26/2015  ? Procedure: LEFT RADIOACTIVE SEED GUIDED EXCISIONAL BREAST BIOPSY;  Surgeon: Stark Klein, MD;  Location: [facility];  Service: General;  Laterality: Left;  ? RADIOACTIVE SEED GUIDED EXCISIONAL BREAST BIOPSY Left 02/20/2020  ? Procedure: LEFT RADIOACTIVE SEED GUIDED EXCISIONAL BREAST BIOPSY;  Surgeon: Stark Klein, MD;  Location: [facility];  Service: General;  Laterality: Left;  RNFA  ? WISDOM TOOTH EXTRACTION    ? ?Social History:  reports that she quit smoking about 17 years ago. Her smoking use included cigarettes. She started smoking about 50 years ago. She has a 30.00 pack-year smoking history. She has never used smokeless tobacco. She reports that she does not drink alcohol and does not use drugs. ?Family History:  ?Family History  ?Problem Relation Age of Onset  ? Cancer Mother   ?     Breast   ? Hypertension Mother   ? Cancer Father   ?     Prostate  ? Hypertension Father   ? Cancer Sister   ?     Breast  ? ? ? ?HOME MEDICATIONS: ?Allergies as of 06/27/2021   ? ?   Reactions  ? Dilantin [phenytoin Sodium Extended] Rash  ? ?  ? ?  ?Medication List  ?  ? ?  ? Accurate as of June 27, 2021  1:14 PM. If you have any questions, ask your nurse or doctor.  ?  ?  ? ?  ? ?  calcium carbonate 1500 (600 Ca) MG Tabs tablet ?Commonly known as: OSCAL ?Take 600 mg of elemental calcium by mouth daily with breakfast. ?  ?calcium carbonate 500 MG chewable tablet ?Commonly known as: TUMS - dosed in mg elemental calcium ?Chew 4 tablets by mouth 2 (two) times daily as needed for indigestion or heartburn. ?  ?Combigan 0.2-0.5 % ophthalmic solution ?Generic drug: brimonidine-timolol ?Place 1 drop into the right eye 2 (two) times daily. ?  ?denosumab 60 MG/ML Sosy injection ?Commonly known as: PROLIA ?Inject 60 mg into the skin every 6 (six) months. ?  ?famotidine 40 MG tablet ?Commonly known as: PEPCID ?Take 40 mg by mouth 2 (two) times daily. ?  ?IRON 27  PO ?Take 1 tablet by mouth daily. ?  ?levothyroxine 88 MCG tablet ?Commonly known as: Synthroid ?Take 1 tablet (88 mcg total) by mouth daily before breakfast. ?  ?Magnesium 250 MG Tabs ?Take 250 mg by mouth daily. ?  ?pantoprazole 40 MG tablet ?Commonly known as: PROTONIX ?TAKE 1 TABLET BY MOUTH ONCE A DAY ?  ?simvastatin 40 MG tablet ?Commonly known as: ZOCOR ?TAKE 1 TABLET BY MOUTH EVERY DAY ?  ?solifenacin 5 MG tablet ?Commonly known as: VESICARE ?Take 5 mg by mouth daily. ?  ?temazepam 15 MG capsule ?Commonly known as: RESTORIL ?TAKE 1 CAPSULE BY MOUTH EVERY DAY AT BEDTIME AS NEEDED ?  ?terbinafine 250 MG tablet ?Commonly known as: LamISIL ?Take 1 tablet (250 mg total) by mouth daily. ?  ?valsartan-hydrochlorothiazide 80-12.5 MG tablet ?Commonly known as: DIOVAN-HCT ?TAKE 1 TABLET BY MOUTH EVERY DAY ?  ?Vitamin D3 125 MCG (5000 UT) Caps ?Take 5,000 Units by mouth daily. ?  ? ?  ? ? ? ? ?OBJECTIVE:  ? ?PHYSICAL EXAM: ?VS: BP 118/72 (BP Location: Left Arm, Patient Position: Sitting, Cuff Size: Small)   Pulse 60   Ht 5\' 4"  (1.626 m)   Wt 194 lb 9.6 oz (88.3 kg)   SpO2 94%   BMI 33.40 kg/m?  ? ? ?EXAM: ?General: Pt appears well and is in NAD  ?Neck: General: Supple without adenopathy. ?Thyroid: Thyroid size normal.  No goiter or nodules appreciated.   ?Lungs: Clear with good BS bilat with no rales, rhonchi, or wheezes  ?Heart: Auscultation: RRR.  ?Abdomen: Normoactive bowel sounds, soft, nontender, without masses or organomegaly palpable  ?Extremities:  ?BL LE: No pretibial edema normal ROM and strength.  ?Mental Status: Judgment, insight: Intact ?Orientation: Oriented to time, place, and person ?Memory: Intact for recent and remote events ?Mood and affect: No depression, anxiety, or agitation  ? ? ? ?DATA REVIEWED: ? ? Latest Reference Range & Units 06/27/21 13:27  ?TSH 0.35 - 5.50 uIU/mL 4.94  ?T4,Free(Direct) 0.60 - 1.60 ng/dL 0.98  ? ? ? ? ?ASSESSMENT / PLAN / RECOMMENDATIONS:  ? ?Hypothyroidism: ? ? ?-Pt  is cliniclaly euthyroid ? ? ? ?Medications  ? ? ?Start Synthroid 88 MCG daily ? ? ? ?Labs in 8 weeks ? ?Signed electronically by: ?Abby Nena Jordan, MD ? ?[facility]  ?[facility] ?[address] ?[place], [place] [address] ?Phone: [phone] ?FAX: [phone]  ? ? ? ? ?CC: ?Glendale Chard, MD ?[address] ?[place] [address] ?Phone: [phone]  ?Fax: [phone] ? ? ?Return to Endocrinology clinic as below: ?Future Appointments  ?Date Time Provider [place]  ?09/29/2021 11:40 AM Glendale Chard, MD [facility] None  ?04/23/2022  2:00 PM [facility] None  ?  ? ?

## 2021-06-27 NOTE — Patient Instructions (Signed)

## 2021-06-30 MED ORDER — LEVOTHYROXINE SODIUM 100 MCG PO TABS: 100.0000 ug | ORAL_TABLET | Freq: Every day | ORAL | 3 refills | Status: DC

## 2021-07-19 ENCOUNTER — Other Ambulatory Visit: Payer: Self-pay | Admitting: Internal Medicine

## 2021-08-04 ENCOUNTER — Telehealth: Payer: Self-pay

## 2021-08-04 NOTE — Telephone Encounter (Signed)
I called the patient because Dr Baird Cancer wanted to know if the pt feels groggy upon awakening and if so then the patient will need to change her sleep med to [place].  ?

## 2021-08-05 ENCOUNTER — Telehealth: Payer: Self-pay

## 2021-08-05 NOTE — Telephone Encounter (Signed)
I called the patient because Dr Baird Cancer wanted to know if the pt feels groggy upon awakening and if so then the patient will need to change her sleep med to [place].  ? ?Patient stated she is not feel groggy when she wakes up in the morning. Sh reported she is doing well with the temazepam. YL,RMA ?

## 2021-08-31 ENCOUNTER — Other Ambulatory Visit: Payer: Self-pay | Admitting: Internal Medicine

## 2021-09-19 ENCOUNTER — Other Ambulatory Visit: Payer: Self-pay | Admitting: Internal Medicine

## 2021-09-29 ENCOUNTER — Ambulatory Visit (INDEPENDENT_AMBULATORY_CARE_PROVIDER_SITE_OTHER): Payer: Medicare HMO | Admitting: Internal Medicine

## 2021-09-29 ENCOUNTER — Encounter: Payer: Self-pay | Admitting: Internal Medicine

## 2021-09-29 VITALS — BP 122/84 | HR 86 | Temp 98.0°F | Ht 61.8 in | Wt 192.2 lb

## 2021-09-29 DIAGNOSIS — N644 Mastodynia: Secondary | ICD-10-CM | POA: Diagnosis not present

## 2021-09-29 DIAGNOSIS — I129 Hypertensive chronic kidney disease with stage 1 through stage 4 chronic kidney disease, or unspecified chronic kidney disease: Secondary | ICD-10-CM | POA: Diagnosis not present

## 2021-09-29 DIAGNOSIS — F5101 Primary insomnia: Secondary | ICD-10-CM | POA: Diagnosis not present

## 2021-09-29 DIAGNOSIS — N1831 Chronic kidney disease, stage 3a: Secondary | ICD-10-CM

## 2021-09-29 DIAGNOSIS — E039 Hypothyroidism, unspecified: Secondary | ICD-10-CM

## 2021-09-29 DIAGNOSIS — E66812 Obesity, class 2: Secondary | ICD-10-CM

## 2021-09-29 DIAGNOSIS — Z6835 Body mass index (BMI) 35.0-35.9, adult: Secondary | ICD-10-CM

## 2021-09-29 DIAGNOSIS — R7309 Other abnormal glucose: Secondary | ICD-10-CM

## 2021-09-29 MED ORDER — TEMAZEPAM 15 MG PO CAPS: ORAL_CAPSULE | ORAL | 1 refills | Status: DC

## 2021-09-29 NOTE — Progress Notes (Signed)
Rich Brave [place],acting as a Education administrator for Maximino Greenland, MD.,have documented all relevant documentation on the behalf of Maximino Greenland, MD,as directed by  Maximino Greenland, MD while in the presence of Maximino Greenland, MD.  This visit occurred during the SARS-CoV-2 public health emergency.  Safety protocols were in place, including screening questions prior to the visit, additional usage of staff PPE, and extensive cleaning of exam room while observing appropriate contact time as indicated for disinfecting solutions.  Subjective:     Patient ID: Rachel [name] , female    DOB: January 30, 1951 , 71 y.o.   MRN: [id number]   Chief Complaint  Patient presents with   Hypertension    HPI  The patient is here today for a blood pressure and thyroid f/u.  She reports compliance with meds. She denies headaches, chest pain and shortness of breath. Patient reports she was recently diagnosed with a UTI and is currently on antibiotics.   Hypertension This is a chronic problem. The current episode started more than 1 year ago. The problem has been gradually improving since onset. The problem is controlled. Pertinent negatives include no blurred vision, chest pain, palpitations or shortness of breath. Risk factors for coronary artery disease include dyslipidemia, obesity, post-menopausal state and sedentary lifestyle. The current treatment provides moderate improvement. Identifiable causes of hypertension include a thyroid problem.  Thyroid Problem Presents for follow-up visit. Patient reports no constipation, palpitations or visual change. The symptoms have been stable.     Past Medical History:  Diagnosis Date   Abnormal mammogram    Brain aneurysm 2005   GERD (gastroesophageal reflux disease)    Hypertension    Hypothyroidism    Osteoporosis    Thyroid disease      Family History  Problem Relation Age of Onset   Cancer Mother        Breast    Hypertension Mother    Cancer Father         Prostate   Hypertension Father    Cancer Sister        Breast     Current Outpatient Medications:    calcium carbonate (TUMS - DOSED IN MG ELEMENTAL CALCIUM) 500 MG chewable tablet, Chew 4 tablets by mouth 2 (two) times daily as needed for indigestion or heartburn., Disp: , Rfl:    Cholecalciferol (VITAMIN D3) 5000 units CAPS, Take 5,000 Units by mouth daily. , Disp: , Rfl:    COMBIGAN 0.2-0.5 % ophthalmic solution, Place 1 drop into the right eye 2 (two) times daily., Disp: , Rfl: 6   denosumab (PROLIA) 60 MG/ML SOSY injection, Inject 60 mg into the skin every 6 (six) months., Disp: 60 mL, Rfl: 1   famotidine (PEPCID) 40 MG tablet, Take 40 mg by mouth 2 (two) times daily., Disp: , Rfl:    Ferrous Gluconate (IRON 27 PO), Take 1 tablet by mouth daily., Disp: , Rfl:    levothyroxine (SYNTHROID) 100 MCG tablet, Take 1 tablet (100 mcg total) by mouth daily before breakfast., Disp: 90 tablet, Rfl: 3   Magnesium 250 MG TABS, Take 250 mg by mouth daily., Disp: , Rfl:    simvastatin (ZOCOR) 40 MG tablet, TAKE 1 TABLET BY MOUTH EVERY DAY, Disp: 90 tablet, Rfl: 2   solifenacin (VESICARE) 5 MG tablet, Take 5 mg by mouth daily., Disp: , Rfl:    valsartan-hydrochlorothiazide (DIOVAN-HCT) 80-12.5 MG tablet, TAKE 1 TABLET BY MOUTH EVERY DAY, Disp: 90 tablet, Rfl: 1   calcium carbonate (  OSCAL) 1500 (600 Ca) MG TABS tablet, Take 600 mg of elemental calcium by mouth daily with breakfast. (Patient not taking: Reported on 09/29/2021), Disp: , Rfl:    cefpodoxime (VANTIN) 100 MG tablet, Take 100 mg by mouth 2 (two) times daily., Disp: , Rfl:    temazepam (RESTORIL) 15 MG capsule, TAKE 1 CAPSULE BY MOUTH EVERY DAY AT BEDTIME AS NEEDED, Disp: 30 capsule, Rfl: 1   terbinafine (LAMISIL) 250 MG tablet, Take 1 tablet (250 mg total) by mouth daily. (Patient not taking: Reported on 09/29/2021), Disp: 90 tablet, Rfl: 0   Allergies  Allergen Reactions   Dilantin [Phenytoin Sodium Extended] Rash     Review of Systems   Constitutional: Negative.   Eyes:  Negative for blurred vision.  Respiratory: Negative.  Negative for shortness of breath.   Cardiovascular: Negative.  Negative for chest pain and palpitations.  Gastrointestinal:  Negative for constipation.  Genitourinary:        Patient reports her left breast feels a little bit sore in one area. She reports she noticed the soreness last Saturday when doing her self breast exam.   Neurological: Negative.   Psychiatric/Behavioral: Negative.       Today's Vitals   09/29/21 1134  BP: 122/84  Pulse: 86  Temp: 98 F (36.7 C)  Weight: 192 lb 3.2 oz (87.2 kg)  Height: 5' 1.8" (1.57 m)  PainSc: 0-No pain   Body mass index is 35.38 kg/m.   Wt Readings from Last 3 Encounters:  09/29/21 192 lb 3.2 oz (87.2 kg)  06/27/21 194 lb 9.6 oz (88.3 kg)  04/09/21 188 lb (85.3 kg)     Objective:  Physical Exam Vitals and nursing note reviewed.  Constitutional:      Appearance: Normal appearance.  HENT:     Head: Normocephalic and atraumatic.  Eyes:     Extraocular Movements: Extraocular movements intact.  Cardiovascular:     Rate and Rhythm: Normal rate and regular rhythm.     Heart sounds: Normal heart sounds.  Pulmonary:     Effort: Pulmonary effort is normal.     Breath sounds: Normal breath sounds.  Chest:  Breasts:    Tanner Score is 5.     Right: Normal.     Left: Tenderness present. No nipple discharge.  Musculoskeletal:     Cervical back: Normal range of motion.  Skin:    General: Skin is warm.  Neurological:     General: No focal deficit present.     Mental Status: She is alert.  Psychiatric:        Mood and Affect: Mood normal.        Behavior: Behavior normal.      Assessment And Plan:     1. Hypertensive nephropathy Comments: Chronic, well controlled. She is encouraged to follow low sodium diet. No med changes. She will f/u in Nov 2023 for her next physical exam.   2. Stage 3a chronic kidney disease ([place]) Comments:  Chronic, encouraged to avoid NSAIDS, stay hydrated and keep Bp controlled to decrease risk of CKD progression.   3. Mastodynia of left breast Comments: I think her sx are due to chest wall pain. She will let me know if it persists.   4. Primary insomnia Comments: Chronic,encouraged to develop bedtime routine.  She was also given refill temazepam to use nightly prn.  - temazepam (RESTORIL) 15 MG capsule; TAKE 1 CAPSULE BY MOUTH EVERY DAY AT BEDTIME AS NEEDED  Dispense: 30 capsule; Refill: 1  5. Acquired hypothyroidism Comments: I will check thyroid panel and adjust meds as needed.  - TSH + free T4  6. Other abnormal glucose Comments: Her a1c has been elevated in the past. I will recheck this today. She is encouraged to limit her intake of sweetened beverages and foods.  - Hemoglobin A1c - Insulin, random(561)  7. Class 2 severe obesity due to excess calories with serious comorbidity and body mass index (BMI) of 35.0 to 35.9 in adult [facility])  She is encouraged to strive for BMI less than 30 to decrease cardiac risk. Advised to aim for at least 150 minutes of exercise per week.   Patient was given opportunity to ask questions. Patient verbalized understanding of the plan and was able to repeat key elements of the plan. All questions were answered to their satisfaction.   I, Maximino Greenland, MD, have reviewed all documentation for this visit. The documentation on 09/29/21 for the exam, diagnosis, procedures, and orders are all accurate and complete.   IF YOU HAVE BEEN REFERRED TO A SPECIALIST, IT MAY TAKE 1-2 WEEKS TO SCHEDULE/PROCESS THE REFERRAL. IF YOU HAVE NOT HEARD FROM US/SPECIALIST IN TWO WEEKS, PLEASE GIVE [place] A CALL AT [phone].   THE PATIENT IS ENCOURAGED TO PRACTICE SOCIAL DISTANCING DUE TO THE COVID-19 PANDEMIC.

## 2021-09-29 NOTE — Patient Instructions (Signed)
Hypertension, Adult ?Hypertension is another name for high blood pressure. High blood pressure forces your heart to work harder to pump blood. This can cause problems over time. ?There are two numbers in a blood pressure reading. There is a top number (systolic) over a bottom number (diastolic). It is best to have a blood pressure that is below 120/80. ?What are the causes? ?The cause of this condition is not known. Some other conditions can lead to high blood pressure. ?What increases the risk? ?Some lifestyle factors can make you more likely to develop high blood pressure: ?Smoking. ?Not getting enough exercise or physical activity. ?Being overweight. ?Having too much fat, sugar, calories, or salt (sodium) in your diet. ?Drinking too much alcohol. ?Other risk factors include: ?Having any of these conditions: ?Heart disease. ?Diabetes. ?High cholesterol. ?Kidney disease. ?Obstructive sleep apnea. ?Having a family history of high blood pressure and high cholesterol. ?Age. The risk increases with age. ?Stress. ?What are the signs or symptoms? ?High blood pressure may not cause symptoms. Very high blood pressure (hypertensive crisis) may cause: ?Headache. ?Fast or uneven heartbeats (palpitations). ?Shortness of breath. ?Nosebleed. ?Vomiting or feeling like you may vomit (nauseous). ?Changes in how you see. ?Very bad chest pain. ?Feeling dizzy. ?Seizures. ?How is this treated? ?This condition is treated by making healthy lifestyle changes, such as: ?Eating healthy foods. ?Exercising more. ?Drinking less alcohol. ?Your doctor may prescribe medicine if lifestyle changes do not help enough and if: ?Your top number is above 130. ?Your bottom number is above 80. ?Your personal target blood pressure may vary. ?Follow these instructions at home: ?Eating and drinking ? ?If told, follow the DASH eating plan. To follow this plan: ?Fill one half of your plate at each meal with fruits and vegetables. ?Fill one fourth of your plate  at each meal with whole grains. Whole grains include whole-wheat pasta, brown rice, and whole-grain bread. ?Eat or drink low-fat dairy products, such as skim milk or low-fat yogurt. ?Fill one fourth of your plate at each meal with low-fat (lean) proteins. Low-fat proteins include fish, chicken without skin, eggs, beans, and tofu. ?Avoid fatty meat, cured and processed meat, or chicken with skin. ?Avoid pre-made or processed food. ?Limit the amount of salt in your diet to less than 1,500 mg each day. ?Do not drink alcohol if: ?Your doctor tells you not to drink. ?You are pregnant, may be pregnant, or are planning to become pregnant. ?If you drink alcohol: ?Limit how much you have to: ?0-1 drink a day for women. ?0-2 drinks a day for men. ?Know how much alcohol is in your drink. In the [place] one drink equals one 12 oz bottle of beer (355 mL), one 5 oz glass of wine (148 mL), or one 1? oz glass of hard liquor (44 mL). ?Lifestyle ? ?Work with your doctor to stay at a healthy weight or to lose weight. Ask your doctor what the best weight is for you. ?Get at least 30 minutes of exercise that causes your heart to beat faster (aerobic exercise) most days of the week. This may include walking, swimming, or biking. ?Get at least 30 minutes of exercise that strengthens your muscles (resistance exercise) at least 3 days a week. This may include lifting weights or doing Pilates. ?Do not smoke or use any products that contain nicotine or tobacco. If you need help quitting, ask your doctor. ?Check your blood pressure at home as told by your doctor. ?Keep all follow-up visits. ?Medicines ?Take over-the-counter and prescription medicines   only as told by your doctor. Follow directions carefully. ?Do not skip doses of blood pressure medicine. The medicine does not work as well if you skip doses. Skipping doses also puts you at risk for problems. ?Ask your doctor about side effects or reactions to medicines that you should watch  for. ?Contact a doctor if: ?You think you are having a reaction to the medicine you are taking. ?You have headaches that keep coming back. ?You feel dizzy. ?You have swelling in your ankles. ?You have trouble with your vision. ?Get help right away if: ?You get a very bad headache. ?You start to feel mixed up (confused). ?You feel weak or numb. ?You feel faint. ?You have very bad pain in your: ?Chest. ?Belly (abdomen). ?You vomit more than once. ?You have trouble breathing. ?These symptoms may be an emergency. Get help right away. Call 911. ?Do not wait to see if the symptoms will go away. ?Do not drive yourself to the hospital. ?Summary ?Hypertension is another name for high blood pressure. ?High blood pressure forces your heart to work harder to pump blood. ?For most people, a normal blood pressure is less than 120/80. ?Making healthy choices can help lower blood pressure. If your blood pressure does not get lower with healthy choices, you may need to take medicine. ?This information is not intended to replace advice given to you by your health care provider. Make sure you discuss any questions you have with your health care provider. ?Document Revised: 01/30/2021 Document Reviewed: 01/30/2021 ?Elsevier Patient Education ? 2023 [employer] ? ?

## 2021-10-01 LAB — HEMOGLOBIN A1C
Est. average glucose Bld gHb Est-mCnc: 126 mg/dL
Hgb A1c MFr Bld: 6 % — ABNORMAL HIGH (ref 4.8–5.6)

## 2021-10-01 LAB — INSULIN, RANDOM: INSULIN: 12.8 u[IU]/mL (ref 2.6–24.9)

## 2021-10-01 LAB — TSH+FREE T4
Free T4: 1.76 ng/dL (ref 0.82–1.77)
TSH: 0.667 u[IU]/mL (ref 0.450–4.500)

## 2021-10-02 ENCOUNTER — Other Ambulatory Visit: Payer: Self-pay | Admitting: Nephrology

## 2021-10-02 DIAGNOSIS — I129 Hypertensive chronic kidney disease with stage 1 through stage 4 chronic kidney disease, or unspecified chronic kidney disease: Secondary | ICD-10-CM

## 2021-10-02 DIAGNOSIS — R7303 Prediabetes: Secondary | ICD-10-CM

## 2021-10-02 DIAGNOSIS — N1831 Chronic kidney disease, stage 3a: Secondary | ICD-10-CM

## 2021-10-02 DIAGNOSIS — N390 Urinary tract infection, site not specified: Secondary | ICD-10-CM

## 2021-10-02 DIAGNOSIS — R3129 Other microscopic hematuria: Secondary | ICD-10-CM

## 2021-10-02 DIAGNOSIS — M8580 Other specified disorders of bone density and structure, unspecified site: Secondary | ICD-10-CM

## 2021-10-03 ENCOUNTER — Other Ambulatory Visit: Payer: Self-pay

## 2021-10-03 DIAGNOSIS — E039 Hypothyroidism, unspecified: Secondary | ICD-10-CM

## 2021-10-06 ENCOUNTER — Ambulatory Visit
Admission: RE | Admit: 2021-10-06 | Discharge: 2021-10-06 | Disposition: A | Discharge status: Home / Self Care | Payer: Medicare HMO | Source: Ambulatory Visit | Attending: Nephrology | Admitting: Nephrology

## 2021-10-06 ENCOUNTER — Encounter: Payer: Self-pay | Admitting: Internal Medicine

## 2021-10-06 DIAGNOSIS — R3129 Other microscopic hematuria: Secondary | ICD-10-CM

## 2021-10-06 DIAGNOSIS — I129 Hypertensive chronic kidney disease with stage 1 through stage 4 chronic kidney disease, or unspecified chronic kidney disease: Secondary | ICD-10-CM

## 2021-10-06 DIAGNOSIS — R7303 Prediabetes: Secondary | ICD-10-CM

## 2021-10-06 DIAGNOSIS — N390 Urinary tract infection, site not specified: Secondary | ICD-10-CM

## 2021-10-06 DIAGNOSIS — M8580 Other specified disorders of bone density and structure, unspecified site: Secondary | ICD-10-CM

## 2021-10-06 DIAGNOSIS — N1831 Chronic kidney disease, stage 3a: Secondary | ICD-10-CM

## 2021-10-13 ENCOUNTER — Ambulatory Visit (INDEPENDENT_AMBULATORY_CARE_PROVIDER_SITE_OTHER): Payer: Medicare HMO

## 2021-10-13 VITALS — BP 122/68 | HR 71 | Temp 98.3°F | Ht 61.0 in | Wt 194.0 lb

## 2021-10-13 DIAGNOSIS — M8580 Other specified disorders of bone density and structure, unspecified site: Secondary | ICD-10-CM

## 2021-10-13 MED ORDER — DENOSUMAB 60 MG/ML ~~LOC~~ SOSY
60.0000 mg | PREFILLED_SYRINGE | Freq: Once | SUBCUTANEOUS | Status: AC
  Administered 2021-10-13: 60 mg via SUBCUTANEOUS

## 2021-10-13 NOTE — Patient Instructions (Signed)
Osteopenia  Osteopenia is a loss of thickness (density) inside the bones. Another name for osteopenia is low bone mass. Mild osteopenia is a normal part of aging. It is not a disease, and it does not cause symptoms. However, if you have osteopenia and continue to lose bone mass, you could develop a condition that causes the bones to become thin and break more easily (osteoporosis). Osteoporosis can cause you to lose some height, have back pain, and have a stooped posture. Although osteopenia is not a disease, making changes to your lifestyle and diet can help to prevent osteopenia from developing into osteoporosis. What are the causes? Osteopenia is caused by loss of calcium in the bones. Bones are constantly changing. Old bone cells are continually being replaced with new bone cells. This process builds new bone. The mineral calcium is needed to build new bone and maintain bone density. Bone density is usually highest around age 35. After that, most people's bodies cannot replace all the bone they have lost with new bone. What increases the risk? You are more likely to develop this condition if: You are older than age 50. You are a woman who went through menopause early. You have a long illness that keeps you in bed. You do not get enough exercise. You lack certain nutrients (malnutrition). You have an overactive thyroid gland (hyperthyroidism). You use products that contain nicotine or tobacco, such as cigarettes, e-cigarettes and chewing tobacco, or you drink a lot of alcohol. You are taking medicines that weaken the bones, such as steroids. What are the signs or symptoms? This condition does not cause any symptoms. You may have a slightly higher risk for bone breaks (fractures), so getting fractures more easily than normal may be an indication of osteopenia. How is this diagnosed? This condition may be diagnosed based on an X-ray exam that measures bone density (dual-energy X-ray  absorptiometry, or DEXA). This test can measure bone density in your hips, spine, and wrists. Osteopenia has no symptoms, so this condition is usually diagnosed after a routine bone density screening test is done for osteoporosis. This routine screening is usually done for: Women who are age 65 or older. Men who are age 70 or older. If you have risk factors for osteopenia, you may have the screening test at an earlier age. How is this treated? Making dietary and lifestyle changes can lower your risk for osteoporosis. If you have severe osteopenia that is close to becoming osteoporosis, this condition can be treated with medicines and dietary supplements such as calcium and vitamin D. These supplements help to rebuild bone density. Follow these instructions at home: Eating and drinking Eat a diet that is high in calcium and vitamin D. Calcium is found in dairy products, beans, salmon, and leafy green vegetables like spinach and broccoli. Look for foods that have vitamin D and calcium added to them (fortified foods), such as orange juice, cereal, and bread.  Lifestyle Do 30 minutes or more of a weight-bearing exercise every day, such as walking, jogging, or playing a sport. These types of exercises strengthen the bones. Do not use any products that contain nicotine or tobacco, such as cigarettes, e-cigarettes, and chewing tobacco. If you need help quitting, ask your health care provider. Do not drink alcohol if: Your health care provider tells you not to drink. You are pregnant, may be pregnant, or are planning to become pregnant. If you drink alcohol: Limit how much you use to: 0-1 drink a day for women. 0-2   drinks a day for men. Be aware of how much alcohol is in your drink. In the [place] one drink equals one 12 oz bottle of beer (355 mL), one 5 oz glass of wine (148 mL), or one 1 oz glass of hard liquor (44 mL). General instructions Take over-the-counter and prescription medicines only as  told by your health care provider. These include vitamins and supplements. Take precautions at home to lower your risk of falling, such as: Keeping rooms well-lit and free of clutter, such as cords. Installing safety rails on stairs. Using rubber mats in the bathroom or other areas that are often wet or slippery. Keep all follow-up visits. This is important. Contact a health care provider if: You have not had a bone density screening for osteoporosis and you are: A woman who is age 65 or older. A man who is age 70 or older. You are a postmenopausal woman who has not had a bone density screening for osteoporosis. You are older than age 50 and you want to know if you should have bone density screening for osteoporosis. Summary Osteopenia is a loss of thickness (density) inside the bones. Another name for osteopenia is low bone mass. Osteopenia is not a disease, but it may increase your risk for a condition that causes the bones to become thin and break more easily (osteoporosis). You may be at risk for osteopenia if you are older than age 50 or if you are a woman who went through early menopause. Osteopenia does not cause any symptoms, but it can be diagnosed with a bone density screening test. Dietary and lifestyle changes are the first treatment for osteopenia. These may lower your risk for osteoporosis. This information is not intended to replace advice given to you by your health care provider. Make sure you discuss any questions you have with your health care provider. Document Revised: 09/28/2019 Document Reviewed: 09/28/2019 Elsevier Patient Education  2023 [employer]  

## 2021-10-13 NOTE — Progress Notes (Signed)
Patient presents today for Prolia injection.

## 2021-10-20 ENCOUNTER — Other Ambulatory Visit: Payer: Self-pay | Admitting: Internal Medicine

## 2021-10-20 DIAGNOSIS — I10 Essential (primary) hypertension: Secondary | ICD-10-CM

## 2021-11-05 ENCOUNTER — Other Ambulatory Visit: Payer: Self-pay | Admitting: Urology

## 2021-11-13 ENCOUNTER — Ambulatory Visit (HOSPITAL_COMMUNITY): Payer: Medicare HMO | Admitting: Certified Registered Nurse Anesthetist

## 2021-11-13 ENCOUNTER — Ambulatory Visit (HOSPITAL_BASED_OUTPATIENT_CLINIC_OR_DEPARTMENT_OTHER): Payer: Medicare HMO | Admitting: Certified Registered Nurse Anesthetist

## 2021-11-13 ENCOUNTER — Other Ambulatory Visit: Payer: Self-pay | Admitting: Urology

## 2021-11-13 ENCOUNTER — Other Ambulatory Visit: Payer: Self-pay | Admitting: Internal Medicine

## 2021-11-13 ENCOUNTER — Ambulatory Visit (HOSPITAL_COMMUNITY): Payer: Medicare HMO

## 2021-11-13 ENCOUNTER — Encounter (HOSPITAL_COMMUNITY): Payer: Self-pay | Admitting: Urology

## 2021-11-13 ENCOUNTER — Encounter (HOSPITAL_COMMUNITY)
Admission: RE | Disposition: A | Discharge status: Home / Self Care | Payer: Self-pay | Source: Ambulatory Visit | Attending: Urology

## 2021-11-13 ENCOUNTER — Ambulatory Visit (HOSPITAL_COMMUNITY)
Admission: RE | Admit: 2021-11-13 | Discharge: 2021-11-13 | Disposition: A | Discharge status: Home / Self Care | Payer: Medicare HMO | Source: Ambulatory Visit | Attending: Urology | Admitting: Urology

## 2021-11-13 DIAGNOSIS — N1330 Unspecified hydronephrosis: Secondary | ICD-10-CM | POA: Diagnosis not present

## 2021-11-13 DIAGNOSIS — Z87440 Personal history of urinary (tract) infections: Secondary | ICD-10-CM | POA: Insufficient documentation

## 2021-11-13 DIAGNOSIS — N132 Hydronephrosis with renal and ureteral calculous obstruction: Secondary | ICD-10-CM | POA: Diagnosis present

## 2021-11-13 DIAGNOSIS — Z8679 Personal history of other diseases of the circulatory system: Secondary | ICD-10-CM | POA: Diagnosis not present

## 2021-11-13 DIAGNOSIS — I129 Hypertensive chronic kidney disease with stage 1 through stage 4 chronic kidney disease, or unspecified chronic kidney disease: Secondary | ICD-10-CM | POA: Diagnosis not present

## 2021-11-13 DIAGNOSIS — K219 Gastro-esophageal reflux disease without esophagitis: Secondary | ICD-10-CM | POA: Insufficient documentation

## 2021-11-13 DIAGNOSIS — N200 Calculus of kidney: Secondary | ICD-10-CM

## 2021-11-13 DIAGNOSIS — I700 Atherosclerosis of aorta: Secondary | ICD-10-CM | POA: Diagnosis not present

## 2021-11-13 DIAGNOSIS — K449 Diaphragmatic hernia without obstruction or gangrene: Secondary | ICD-10-CM | POA: Insufficient documentation

## 2021-11-13 DIAGNOSIS — N201 Calculus of ureter: Secondary | ICD-10-CM | POA: Diagnosis not present

## 2021-11-13 DIAGNOSIS — N1831 Chronic kidney disease, stage 3a: Secondary | ICD-10-CM | POA: Diagnosis not present

## 2021-11-13 DIAGNOSIS — Z8249 Family history of ischemic heart disease and other diseases of the circulatory system: Secondary | ICD-10-CM | POA: Insufficient documentation

## 2021-11-13 DIAGNOSIS — E039 Hypothyroidism, unspecified: Secondary | ICD-10-CM | POA: Diagnosis not present

## 2021-11-13 DIAGNOSIS — Z87891 Personal history of nicotine dependence: Secondary | ICD-10-CM | POA: Insufficient documentation

## 2021-11-13 HISTORY — PX: CYSTOSCOPY W/ URETERAL STENT PLACEMENT: SHX1429

## 2021-11-13 LAB — BASIC METABOLIC PANEL
Anion gap: 6 (ref 5–15)
BUN: 26 mg/dL — ABNORMAL HIGH (ref 8–23)
CO2: 24 mmol/L (ref 22–32)
Calcium: 9.3 mg/dL (ref 8.9–10.3)
Chloride: 109 mmol/L (ref 98–111)
Creatinine, Ser: 1.23 mg/dL — ABNORMAL HIGH (ref 0.44–1.00)
GFR, Estimated: 47 mL/min — ABNORMAL LOW (ref >60)
Glucose, Bld: 100 mg/dL — ABNORMAL HIGH (ref 70–99)
Potassium: 4.1 mmol/L (ref 3.5–5.1)
Sodium: 139 mmol/L (ref 135–145)

## 2021-11-13 LAB — CBC WITH DIFFERENTIAL/PLATELET
Abs Immature Granulocytes: 0.01 10*3/uL (ref 0.00–0.07)
Basophils Absolute: 0 10*3/uL (ref 0.0–0.1)
Basophils Relative: 1 %
Eosinophils Absolute: 0.2 10*3/uL (ref 0.0–0.5)
Eosinophils Relative: 4 %
HCT: 43.9 % (ref 36.0–46.0)
Hemoglobin: 14 g/dL (ref 12.0–15.0)
Immature Granulocytes: 0 %
Lymphocytes Relative: 30 %
Lymphs Abs: 1.7 10*3/uL (ref 0.7–4.0)
MCH: 32.7 pg (ref 26.0–34.0)
MCHC: 31.9 g/dL (ref 30.0–36.0)
MCV: 102.6 fL — ABNORMAL HIGH (ref 80.0–100.0)
Monocytes Absolute: 0.6 10*3/uL (ref 0.1–1.0)
Monocytes Relative: 11 %
Neutro Abs: 3.1 10*3/uL (ref 1.7–7.7)
Neutrophils Relative %: 54 %
Platelets: 234 10*3/uL (ref 150–400)
RBC: 4.28 MIL/uL (ref 3.87–5.11)
RDW: 14.6 % (ref 11.5–15.5)
WBC: 5.6 10*3/uL (ref 4.0–10.5)
nRBC: 0 % (ref 0.0–0.2)

## 2021-11-13 SURGERY — CYSTOSCOPY, WITH RETROGRADE PYELOGRAM AND URETERAL STENT INSERTION
Anesthesia: General | Laterality: Left

## 2021-11-13 MED ORDER — FENTANYL CITRATE (PF) 100 MCG/2ML IJ SOLN
INTRAMUSCULAR | Status: DC | PRN
  Administered 2021-11-13: 25 ug via INTRAVENOUS

## 2021-11-13 MED ORDER — FENTANYL CITRATE (PF) 100 MCG/2ML IJ SOLN
INTRAMUSCULAR | Status: AC
  Filled 2021-11-13: qty 2

## 2021-11-13 MED ORDER — ONDANSETRON HCL 4 MG/2ML IJ SOLN
INTRAMUSCULAR | Status: AC
  Filled 2021-11-13: qty 2

## 2021-11-13 MED ORDER — DEXAMETHASONE SODIUM PHOSPHATE 10 MG/ML IJ SOLN
INTRAMUSCULAR | Status: DC | PRN
  Administered 2021-11-13: 5 mg via INTRAVENOUS

## 2021-11-13 MED ORDER — PROPOFOL 10 MG/ML IV BOLUS
INTRAVENOUS | Status: DC | PRN
  Administered 2021-11-13: 50 mg via INTRAVENOUS
  Administered 2021-11-13: 100 mg via INTRAVENOUS

## 2021-11-13 MED ORDER — LIDOCAINE HCL (PF) 2 % IJ SOLN
INTRAMUSCULAR | Status: AC
  Filled 2021-11-13: qty 5

## 2021-11-13 MED ORDER — DEXAMETHASONE SODIUM PHOSPHATE 10 MG/ML IJ SOLN
INTRAMUSCULAR | Status: AC
  Filled 2021-11-13: qty 1

## 2021-11-13 MED ORDER — EPHEDRINE 5 MG/ML INJ
INTRAVENOUS | Status: AC
  Filled 2021-11-13: qty 10

## 2021-11-13 MED ORDER — ONDANSETRON HCL 4 MG/2ML IJ SOLN
INTRAMUSCULAR | Status: DC | PRN
  Administered 2021-11-13: 4 mg via INTRAVENOUS

## 2021-11-13 MED ORDER — GENTAMICIN SULFATE 40 MG/ML IJ SOLN
5.0000 mg/kg | INTRAVENOUS | Status: AC
  Administered 2021-11-13: 440 mg via INTRAVENOUS
  Filled 2021-11-13: qty 11

## 2021-11-13 MED ORDER — OXYCODONE HCL 5 MG PO TABS: 5.0000 mg | ORAL_TABLET | Freq: Once | ORAL | Status: DC | PRN

## 2021-11-13 MED ORDER — LACTATED RINGERS IV SOLN: INTRAVENOUS | Status: DC

## 2021-11-13 MED ORDER — EPHEDRINE SULFATE-NACL 50-0.9 MG/10ML-% IV SOSY
PREFILLED_SYRINGE | INTRAVENOUS | Status: DC | PRN
  Administered 2021-11-13 (×2): 10 mg via INTRAVENOUS
  Administered 2021-11-13: 5 mg via INTRAVENOUS

## 2021-11-13 MED ORDER — LIDOCAINE 2% (20 MG/ML) 5 ML SYRINGE
INTRAMUSCULAR | Status: DC | PRN
  Administered 2021-11-13: 100 mg via INTRAVENOUS

## 2021-11-13 MED ORDER — CHLORHEXIDINE GLUCONATE 0.12 % MT SOLN
15.0000 mL | Freq: Once | OROMUCOSAL | Status: AC
  Administered 2021-11-13: 15 mL via OROMUCOSAL

## 2021-11-13 MED ORDER — ONDANSETRON HCL 4 MG/2ML IJ SOLN: 4.0000 mg | Freq: Four times a day (QID) | INTRAMUSCULAR | Status: DC | PRN

## 2021-11-13 MED ORDER — CIPROFLOXACIN HCL 500 MG PO TABS: 500.0000 mg | ORAL_TABLET | Freq: Two times a day (BID) | ORAL | 0 refills | Status: AC

## 2021-11-13 MED ORDER — OXYCODONE HCL 5 MG/5ML PO SOLN: 5.0000 mg | Freq: Once | ORAL | Status: DC | PRN

## 2021-11-13 MED ORDER — PROPOFOL 10 MG/ML IV BOLUS
INTRAVENOUS | Status: AC
  Filled 2021-11-13: qty 20

## 2021-11-13 MED ORDER — STERILE WATER FOR IRRIGATION IR SOLN
Status: DC | PRN
  Administered 2021-11-13: 3000 mL

## 2021-11-13 MED ORDER — IOHEXOL 300 MG/ML  SOLN
INTRAMUSCULAR | Status: DC | PRN
  Administered 2021-11-13: 10 mL

## 2021-11-13 MED ORDER — FENTANYL CITRATE PF 50 MCG/ML IJ SOSY: 25.0000 ug | PREFILLED_SYRINGE | INTRAMUSCULAR | Status: DC | PRN

## 2021-11-13 SURGICAL SUPPLY — 14 items
BAG URO CATCHER STRL LF (MISCELLANEOUS) ×3 IMPLANT
CATH URETL OPEN 5X70 (CATHETERS) IMPLANT
CLOTH BEACON ORANGE TIMEOUT ST (SAFETY) ×3 IMPLANT
GLOVE BIOGEL M 7.0 STRL (GLOVE) ×3 IMPLANT
GOWN STRL REUS W/ TWL XL LVL3 (GOWN DISPOSABLE) ×2 IMPLANT
GOWN STRL REUS W/TWL XL LVL3 (GOWN DISPOSABLE) ×2
GUIDEWIRE STR DUAL SENSOR (WIRE) ×3 IMPLANT
GUIDEWIRE ZIPWRE .038 STRAIGHT (WIRE) IMPLANT
MANIFOLD NEPTUNE II (INSTRUMENTS) ×3 IMPLANT
PACK CYSTO (CUSTOM PROCEDURE TRAY) ×3 IMPLANT
STENT URET 6FRX28 CONTOUR (STENTS) ×1 IMPLANT
SYR 10ML LL (SYRINGE) ×3 IMPLANT
TUBING CONNECTING 10 (TUBING) ×3 IMPLANT
TUBING UROLOGY SET (TUBING) IMPLANT

## 2021-11-13 NOTE — Anesthesia Procedure Notes (Signed)
Procedure Name: LMA Insertion Date/Time: 11/13/2021 3:54 PM  Performed by: West Pugh, CRNAPre-anesthesia Checklist: Patient identified, Emergency Drugs available, Suction available, Patient being monitored and Timeout performed Patient Re-evaluated:Patient Re-evaluated prior to induction Oxygen Delivery Method: Circle system utilized Preoxygenation: Pre-oxygenation with 100% oxygen Induction Type: IV induction LMA: LMA with gastric port inserted LMA Size: 4.0 Number of attempts: 1 Placement Confirmation: positive ETCO2 Tube secured with: Tape Dental Injury: Teeth and Oropharynx as per pre-operative assessment

## 2021-11-13 NOTE — Op Note (Signed)
Operative Note  Preoperative diagnosis:  1.  Left UPJ stone  Postoperative diagnosis: 1.  Left UPJ stone  Procedure(s): 1.  Cystoscopy 2. Left retrograde pyelogram with interpretation 3. Left ureteral stent placement 4. Fluoroscopy <1 hour with intraoperative interpretation  Surgeon: Rexene Alberts, MD  Assistants:  None  Anesthesia:  General  Complications:  None  EBL:  Minimal  Specimens: 1.  ID Type Source Tests Collected by Time Destination  A : Left renal pelvic urine culture Urine PATH Cytology Urine URINE CULTURE Janith Lima, MD 11/13/2021 1606     Drains/Catheters: 1.  Left 6Fr x 28cm ureteral stent  Intraoperative findings:   Cystoscopy demonstrated no suspicious lesions, masses, stones or other pathology. Left retrograde pyelogram demonstrated severe left hydronephrosis. Successful left ureteral stent placement with curl in the renal pelvis and bladder respectively.  Indication:  Rachel [name] is a 71 y.o. female with a left UPJ stone and lower pole stone. After reviewing the management options for treatment, she elected to proceed with the above surgical procedure(s). We have discussed the potential benefits and risks of the procedure, side effects of the proposed treatment, the likelihood of the patient achieving the goals of the procedure, and any potential problems that might occur during the procedure or recuperation. Informed consent has been obtained.  Description of procedure: The patient was taken to the operating room and general anesthesia was induced.  The patient was placed in the dorsal lithotomy position, prepped and draped in the usual sterile fashion, and preoperative antibiotics were administered. A preoperative time-out was performed.   Cystourethroscopy was performed.  The patient's urethra was examined and was normal. The bladder was then systematically examined in its entirety. There was no evidence for any bladder tumors, stones, or other  mucosal pathology.    Attention then turned to the left ureteral orifice. A 0.038 zip wire was passed through the left orifice and over the wire a 5 Fr open ended catheter was inserted and passed up to the level of the renal pelvis. Aspirate was obtained and sent off as left renal pelvis urine for culture. Omnipaque contrast was injected through the ureteral catheter and a retrograde pyelogram was performed with findings as dictated above. The wire was then replaced and the open ended catheter was removed.   A 6Fr x 28cm ureteral stent was advance over the wire. The stent was positioned appropriately under fluoroscopic and cystoscopic guidance.  The wire was then removed with an adequate stent curl noted in the renal pelvis as well as in the bladder.  The bladder was then emptied and the procedure ended.  The patient appeared to tolerate the procedure well and without complications.  The patient was able to be awakened and transferred to the recovery unit in satisfactory condition.    [facility]  Pager: [phone]

## 2021-11-13 NOTE — Anesthesia Preprocedure Evaluation (Signed)
Anesthesia Evaluation  Patient identified by MRN, date of birth, ID band Patient awake    Reviewed: Allergy & Precautions, H&P , NPO status , Patient's Chart, lab work & pertinent test results  Airway Mallampati: II   Neck ROM: full    Dental   Pulmonary former smoker,    breath sounds clear to auscultation       Cardiovascular hypertension,  Rhythm:regular Rate:Normal     Neuro/Psych H/o cerebral aneurysm repaired 2005.    GI/Hepatic GERD  ,  Endo/Other  Hypothyroidism   Renal/GU stones     Musculoskeletal   Abdominal   Peds  Hematology   Anesthesia Other Findings   Reproductive/Obstetrics                             Anesthesia Physical Anesthesia Plan  ASA: 2  Anesthesia Plan: General   Post-op Pain Management:    Induction: Intravenous  PONV Risk Score and Plan: 3 and Ondansetron, Dexamethasone and Treatment may vary due to age or medical condition  Airway Management Planned: LMA  Additional Equipment:   Intra-op Plan:   Post-operative Plan: Extubation in OR  Informed Consent: I have reviewed the patients History and Physical, chart, labs and discussed the procedure including the risks, benefits and alternatives for the proposed anesthesia with the patient or authorized representative who has indicated his/her understanding and acceptance.     Dental advisory given  Plan Discussed with: CRNA, Anesthesiologist and Surgeon  Anesthesia Plan Comments:         Anesthesia Quick Evaluation

## 2021-11-13 NOTE — Discharge Instructions (Signed)
[facility] [phone] Post Stent Instructions  Definitions:  Ureter: The duct that transports urine from the kidney to the bladder. Stent:   A plastic hollow tube that is placed into the ureter, from the kidney to the bladder to prevent the ureter from swelling shut.  GENERAL INSTRUCTIONS:  Despite the fact that no skin incisions were used, the area around the ureter and bladder is raw and irritated. The stent is a foreign body which will further irritate the bladder wall. This irritation is manifested by increased frequency of urination, both day and night, and by an increase in the urge to urinate. In some, the urge to urinate is present almost always. Sometimes the urge is strong enough that you may not be able to stop yourself from urinating. The only real cure is to remove the stent and then give time for the bladder wall to heal which can't be done until the danger of the ureter swelling shut has passed, which varies.  You may see some blood in your urine while the stent is in place and a few days afterwards. Do not be alarmed, even if the urine was clear for a while. Get off your feet and drink lots of fluids until clearing occurs. If you start to pass clots or don't improve, call us.  DIET: You may return to your normal diet immediately. Because of the raw surface of your bladder, alcohol, spicy foods, acid type foods and drinks with caffeine may cause irritation or frequency and should be used in moderation. To keep your urine flowing freely and to avoid constipation, drink plenty of fluids during the day ( 8-10 glasses ). Tip: Avoid cranberry juice because it is very acidic.  ACTIVITY: Your physical activity doesn't need to be restricted. However, if you are very active, you may see some blood in your urine. We suggest that you reduce your activity under these circumstances until the bleeding has stopped.  BOWELS: It is important to keep your bowels regular during  the postoperative period. Straining with bowel movements can cause bleeding. A bowel movement every other day is reasonable. Use a mild laxative if needed, such as Milk of Magnesia 2-3 tablespoons, or 2 Dulcolax tablets. Call if you continue to have problems. If you have been taking narcotics for pain, before, during or after your surgery, you may be constipated. Take a laxative if necessary.   MEDICATION: You should resume your pre-surgery medications unless told not to. In addition you will often be given an antibiotic to prevent infection. These should be taken as prescribed until the bottles are finished unless you are having an unusual reaction to one of the drugs.  PROBLEMS YOU SHOULD REPORT TO US: Fevers over 100.5 Fahrenheit. Heavy bleeding, or clots ( See above notes about blood in urine ). Inability to urinate. Drug reactions ( hives, rash, nausea, vomiting, diarrhea ). Severe burning or pain with urination that is not improving.  FOLLOW-UP: You will need a follow-up appointment to monitor your progress. Call for this appointment at the number listed above. Usually the first appointment will be about three to fourteen days after your surgery.  

## 2021-11-13 NOTE — H&P (Signed)
Office Visit Report     11/10/2021   --------------------------------------------------------------------------------   Rachel [name]  MRN: [id number]  DOB: [birth year], 71 year old Female  SSN:    PRIMARY CARE:  Glendale Chard, MD  REFERRING:  Janith Lima, MD  PROVIDER:  Rexene Alberts, M.D.  LOCATION:  [facility] [phone]     --------------------------------------------------------------------------------   CC/HPI: Rachel [name] is a 71 year old female who is seen in follow-up today with urolithiasis and filling defects within her left collecting system.   #1. Bilateral hydronephrosis:  -She has a history of stage IIIa chronic kidney disease. Creatinine on 06/17/2021 was 1.1 with EGFR 51. Due to history of CKD, she underwent renal ultrasound 10/07/2021 that incidentally revealed mild right hydronephrosis and moderate left hydronephrosis. She was also found to have a 1.5 cm nonobstructing stone in the lower pole the left kidney. Previous ultrasounds have demonstrated mild parenchymal atrophy bilaterally.  CT A/P 10/29/2021 with moderate to severe left-sided hydronephrosis secondary to a 1.3 cm left UPJ stone. She also had multiple filling defects in the left renal collecting system and left ureter favored to represent blood clots. She also had an 8 mm nonobstructing stone in the left lower pole. There is no stones in the right side or right-sided hydronephrosis  -She denies abdominal pain or flank pain. She denies gross hematuria.  -Urinalysis today is without microscopic hematuria.   #2. Urolithiasis: Renal ultrasound 09/2021 demonstrates 1.5 cm nonobstructing stone in the lower left pole of the kidney. Of note, she had CT A/P in 2017 demonstrating 7 mm left lower pole stone. CT A/P 10/29/2021 with 1.3 cm left UPJ stone and 8 mm left lower pole stone. She denies passing stones. She denies surgical intervention for stones. She denies abdominal pain or flank pain.   She does  states she has had 2 UTIs over the past 4 months. She denies other prior history of recurrent urinary tract infections. She denies hospitalizations for UTIs. She denies pyelonephritis.   Patient currently denies fever, chills, sweats, nausea, vomiting, abdominal or flank pain, gross hematuria or dysuria.   She has a past medical history of hypertension, prediabetes, hypothyroidism, osteoporosis, history of brain aneurysm, GERD.   Her brother-in-law is Dr. Gerrit Friends, MD.     ALLERGIES: Dilantin    MEDICATIONS: Levothyroxine Sodium 100 mcg tablet  Simvastatin  Calcium Carbonate  Combigan 0.2 %-0.5 % drops  Famotidine  Iron  Magnesium  Prolia 60 mg/ml syringe  Solifenacin Succinate 5 mg tablet  Temazepam 15 mg capsule  Valsartan-Hydrochlorothiazide  Vitamin D3     GU PSH: Locm 300-399Mg/Ml Iodine,1Ml - 10/29/2021     NON-GU PSH: Brain Aneurysm Repr; Simple - 2005 Partial Hysterectomy     GU PMH: Hydronephrosis - 10/29/2021, - 10/23/2021 Renal calculus - 10/23/2021    NON-GU PMH: GERD Glaucoma Hypercholesterolemia Hypertension    FAMILY HISTORY: 1 son - Runs in Family Alzheimer's Disease - Father Aortic Aneurysm - Mother Breast Cancer - Sister, Mother Prostate Cancer - Father   SOCIAL HISTORY: Marital Status: Married Ethnicity: Not Hispanic Or Latino; Race: Black or African American Current Smoking Status: Patient does not smoke anymore. Has not smoked since 09/26/2003.   Tobacco Use Assessment Completed: Used Tobacco in last 30 days? Has never drank.  Drinks 1 caffeinated drink per day.    REVIEW OF SYSTEMS:    GU Review Female:   Patient denies frequent urination, hard to postpone urination, burning /pain with urination, get up at night to urinate,  leakage of urine, stream starts and stops, trouble starting your stream, have to strain to urinate, and being pregnant.  Gastrointestinal (Upper):   Patient denies nausea, vomiting, and indigestion/ heartburn.   Gastrointestinal (Lower):   Patient denies diarrhea and constipation.  Constitutional:   Patient denies fever, fatigue, night sweats, and weight loss.  Skin:   Patient denies skin rash/ lesion and itching.  Eyes:   Patient denies blurred vision and double vision.  Ears/ Nose/ Throat:   Patient denies sore throat and sinus problems.  Hematologic/Lymphatic:   Patient denies swollen glands and easy bruising.  Cardiovascular:   Patient denies leg swelling and chest pains.  Respiratory:   Patient denies cough and shortness of breath.  Endocrine:   Patient denies excessive thirst.  Musculoskeletal:   Patient denies back pain and joint pain.  Neurological:   Patient denies headaches and dizziness.  Psychologic:   Patient denies depression and anxiety.   VITAL SIGNS: None   MULTI-SYSTEM PHYSICAL EXAMINATION:    Constitutional: Well-nourished. No physical deformities. Normally developed. Good grooming.  Respiratory: No labored breathing, no use of accessory muscles.   Cardiovascular: Normal temperature, normal extremity pulses, no swelling, no varicosities.  Gastrointestinal: No mass, no tenderness, no rigidity, non obese abdomen.     Complexity of Data:  Source Of History:  Patient, Medical Record Summary  Records Review:   Previous Doctor Records, Previous Patient Records  Urine Test Review:   Urinalysis  X-Ray Review: C.T. Abdomen/Pelvis: Reviewed Films. Reviewed Report. Discussed With Patient.    Notes:                     LINICAL DATA: Evaluate hydronephrosis.   EXAM:  CT ABDOMEN AND PELVIS WITHOUT AND WITH CONTRAST   TECHNIQUE:  Multidetector CT imaging of the abdomen and pelvis was performed  following the standard protocol before and following the bolus  administration of intravenous contrast.   RADIATION DOSE REDUCTION: This exam was performed according to the  departmental dose-optimization program which includes automated  exposure control, adjustment of the mA and/or kV  according to  patient size and/or use of iterative reconstruction technique.   CONTRAST: 125 cc of Omnipaque 300   COMPARISON: Ultrasound 10/06/2021.   FINDINGS:  Lower chest: No acute abnormality. Chronic peripheral predominant  interstitial reticulation is identified.   Hepatobiliary: Low-density structure in lateral right lobe of liver  is too small to characterize measuring 6 mm, image 18/5. Small  stones or sludge noted within the dependent portion of the  gallbladder. Focal area of enhancement with peripheral calcification  is noted involving the gallbladder fundus, image 34/601. Focal area  of cystic changes noted within this area. No signs of gallbladder  wall inflammation or bile duct dilatation.   Pancreas: Unremarkable. No pancreatic ductal dilatation or  surrounding inflammatory changes.   Spleen: Normal in size without focal abnormality.   Adrenals/Urinary Tract: Normal adrenal glands. Small bilateral too  small to reliably characterize kidney cysts are noted bilaterally.   There are multiple parapelvic cysts within the right kidney. No  right-sided hydronephrosis, nephrolithiasis or hydroureter.   Moderate to severe left hydronephrosis is identified. Obstructing  stone at the left UPJ measures 1.3 cm, image 74/601. On the delayed  images there are several filling defects identified within the  dilated collecting systems, which are favored to represent blood  clots. There is asymmetric left-sided urothelial enhancement  involving the left renal collecting system and left ureter.  Nonobstructing stone is noted  within the inferior pole of the left  kidney measuring 0.8 cm.   Urinary bladder is unremarkable.   Stomach/Bowel: Moderate to large hiatal hernia. No bowel wall  thickening, inflammation, or distension. Sigmoid diverticulosis  without signs of acute diverticulitis.   Vascular/Lymphatic: Aortic atherosclerosis. No aneurysm. No upper  abdominal  adenopathy.   Reproductive: The uterus appears retroflexed towards the right  posterior pelvis. No adnexal mass identified.   Other: No free fluid or fluid collections.   Musculoskeletal: No acute or significant osseous findings.   IMPRESSION:  1. Moderate to severe left-sided hydronephrosis secondary to 1.3 cm  left UPJ calculus.  2. Multiple filling defects identified within the left renal  collecting system and left ureter which are favored to represent  blood clots.  3. Moderate to large hiatal hernia.  4. Focal area of enhancement with peripheral calcification and  cystic change involving the gallbladder fundus is noted. This is  favored to represent a benign abnormality such as adenomyomatosis.  Advise follow-up imaging with nonemergent, contrast enhanced MRI of  the abdomen.  5. Chronic peripheral predominant interstitial reticulation within  the lung bases.  6. Aortic Atherosclerosis (ICD10-I70.0).    Electronically Signed  By: Kerby Moors M.D.  On: 10/30/2021 07:32   PROCEDURES:          Urinalysis w/Scope - 81001 Dipstick Dipstick Cont'd Micro  Color: Yellow Bilirubin: Neg WBC/hpf: >60/hpf  Appearance: Cloudy Ketones: Neg RBC/hpf: 3 - 10/hpf  Specific Gravity: 1.020 Blood: 3+ Bacteria: Moderate (26-50/hpf)  pH: 5.5 Protein: Trace Cystals: NS (Not Seen)  Glucose: Neg Urobilinogen: 0.2 Casts: NS (Not Seen)    Nitrites: Neg Trichomonas: Not Present    Leukocyte Esterase: 3+ Mucous: Not Present      Epithelial Cells: 0 - 5/hpf      Yeast: NS (Not Seen)      Sperm: Not Present    ASSESSMENT:      ICD-10 Details  1 GU:   Renal calculus - N20.0   2   Hydronephrosis - N13.0    PLAN:           Orders Labs Urine Cytology, CULTURE, URINE          Document Letter(s):  Created for Patient: Clinical Summary         Notes:   #1. Urolithiasis:  CT A/P 10/29/2021 again reviewed today with evidence of 1.3 cm left UPJ stone and 8 mm left lower pole stone. She is  currently scheduled for cystoscopy, left ureteroscopy with laser lithotripsy and basket extraction of stones with possible biopsy on 12/05/2021. This may require staged procedure. Discussed risk and benefits.  -We will obtain preoperative urine for culture today.  -Will send urine for cytology.   We discussed the options for management of kidney stones, including observation, ESWL, ureteroscopy with laser lithotripsy, and PCNL. The risks and benefits of each option were discussed.  For observation I described the risks which include but are not limited to silent renal damage, life-threatening infection, need for emergent surgery, failure to pass stone, and pain.   ESWL: risks and benefits of ESWL were outlined including infection, bleeding, pain, steinstrasse, kidney injury, need for ancillary treatments, and global anesthesia risks including but not limited to CVA, MI, DVT, PE, pneumonia, and death.   Ureteroscopy: risks and benefits of ureteroscopy were outlined, including infection, bleeding, pain, temporary ureteral stent and associated stent bother, ureteral injury, ureteral stricture, need for ancillary treatments, and global anesthesia risks including but not limited to  CVA, MI, DVT, PE, pneumonia, and death.   PCNL: risks and benefits of PCNL were outlined including infection, bleeding, blood transfusion, pain, pneumothorax, bowel injury, persistent urine leak, positioning injury, inability to clear stone burden, renal laceration, arterial venous fistula or malformation, need for ancillary treatments, and global anesthesia risks including but not limited to CVA, MI, DVT, PE, pneumonia, and death.    We discussed dietary methods for stone prevention including the following: increased water intake to 2-3 liters per day, add lemon or lemon concentrate to water to increase citrate which is beneficial for stone prevention, limiting dietary sodium to less than 2000 mg per day, limiting animal protein  to less than 2 servings (16 ounces/day), and limiting foods high in oxalate content (spinach, beans, chocolate, etc.).   I gave her a copy of her CT report. I did discuss that there is an incidental finding of focal area of enhancement with peripheral calcification and cystic change involving the gallbladder fundus. I discussed that this is favored to represent benign abnormality. I advised follow-up with her PCP to obtain elective MRI to further evaluate.   CC: Cyndee Brightly, MD           Next Appointment:      Next Appointment: 12/05/2021 01:30 PM    Appointment Type: Surgery     Location: [facility] [phone]    Provider: Rexene Alberts, M.D.    Reason for Visit: NE/OP CYSTO, LT RPG, LT URS LL L STENT    Urology Preoperative H&P   Chief Complaint: Left UPJ stone  History of Present Illness: Rachel [name] is a 71 y.o. female with a left UPJ stone here for left ureteral stent placement. Ucx 7/17 with >100K  Klebsiella. Planning on stent and antibiotics before definitive treatment of stone.    Past Medical History:  Diagnosis Date   Abnormal mammogram    Brain aneurysm 2005   GERD (gastroesophageal reflux disease)    Hypertension    Hypothyroidism    Osteoporosis    Thyroid disease     Past Surgical History:  Procedure Laterality Date   BRAIN SURGERY     2005    DR CABBELL    CLOSED REDUCTION NASAL FRACTURE N/A 06/21/2020   Procedure: CLOSED REDUCTION NASAL FRACTURE;  Surgeon: Jason Coop, DO;  Location: [facility];  Service: ENT;  Laterality: N/A;   COLONOSCOPY     LAPAROSCOPIC BILATERAL SALPINGO OOPHERECTOMY Bilateral 11/12/2015   Procedure: LAPAROSCOPIC BILATERAL SALPINGO OOPHORECTOMY;  Surgeon: Everett Graff, MD;  Location: [facility];  Service: Gynecology;  Laterality: Bilateral;   LAPAROSCOPY N/A 11/22/2015   Procedure: LAPAROSCOPY DIAGNOSTIC with repair of serosal tear;  Surgeon: Everett Graff, MD;  Location: [facility];  Service: Gynecology;   Laterality: N/A;   RADIOACTIVE SEED GUIDED EXCISIONAL BREAST BIOPSY Left 09/26/2015   Procedure: LEFT RADIOACTIVE SEED GUIDED EXCISIONAL BREAST BIOPSY;  Surgeon: Stark Klein, MD;  Location: [facility];  Service: General;  Laterality: Left;   RADIOACTIVE SEED GUIDED EXCISIONAL BREAST BIOPSY Left 02/20/2020   Procedure: LEFT RADIOACTIVE SEED GUIDED EXCISIONAL BREAST BIOPSY;  Surgeon: Stark Klein, MD;  Location: [facility];  Service: General;  Laterality: Left;  RNFA   WISDOM TOOTH EXTRACTION      Allergies:  Allergies  Allergen Reactions   Dilantin [Phenytoin Sodium Extended] Rash    Family History  Problem Relation Age of Onset   Cancer Mother        Breast    Hypertension Mother  Cancer Father        Prostate   Hypertension Father    Cancer Sister        Breast    Social History:  reports that she quit smoking about 17 years ago. Her smoking use included cigarettes. She started smoking about 50 years ago. She has a 30.00 pack-year smoking history. She has never used smokeless tobacco. She reports that she does not drink alcohol and does not use drugs.  ROS: A complete review of systems was performed.  All systems are negative except for pertinent findings as noted.  Physical Exam:  Vital signs in last 24 hours: Temp:  [97.6 F (36.4 C)] 97.6 F (36.4 C) (07/20 1351) Pulse Rate:  [64] 64 (07/20 1351) Resp:  [17] 17 (07/20 1351) BP: (108)/(70) 108/70 (07/20 1351) SpO2:  [94 %] 94 % (07/20 1351) Weight:  [87.2 kg] 87.2 kg (07/20 1405) Constitutional:  Alert and oriented, No acute distress Cardiovascular: Regular rate and rhythm Respiratory: Normal respiratory effort, Lungs clear bilaterally GI: Abdomen is soft, nontender, nondistended, no abdominal masses GU: No CVA tenderness Lymphatic: No lymphadenopathy Neurologic: Grossly intact, no focal deficits Psychiatric: Normal mood and affect  Laboratory Data:  No results for input(s): "WBC", "HGB", "HCT", "PLT"  in the last 72 hours.  No results for input(s): "NA", "K", "CL", "GLUCOSE", "BUN", "CALCIUM", "CREATININE" in the last 72 hours.  Invalid input(s): "CO3"   No results found for this or any previous visit (from the past 24 hour(s)). No results found for this or any previous visit (from the past 240 hour(s)).  Renal Function: No results for input(s): "CREATININE" in the last 168 hours. CrCl cannot be calculated (Patient's most recent lab result is older than the maximum 21 days allowed.).  Radiologic Imaging: No results found.  I independently reviewed the above imaging studies.  Assessment and Plan JOZLIN BENTLY is a 71 y.o. female with left UPJ stone here for left ureteral stent placement.    -The risks, benefits and alternatives of cystoscopy with left JJ stent placement was discussed with the patient.  Risks include, but are not limited to: bleeding, urinary tract infection, ureteral injury, ureteral stricture disease, chronic pain, urinary symptoms, bladder injury, stent migration, the need for nephrostomy tube placement, MI, CVA, DVT, PE and the inherent risks with general anesthesia.  The patient voices understanding and wishes to proceed.    Matt R. Dovid Bartko MD 11/13/2021, 2:34 PM  [facility] Pager: [phone]): [phone]

## 2021-11-13 NOTE — Transfer of Care (Signed)
Immediate Anesthesia Transfer of Care Note  Patient: Rachel [name]  Procedure(s) Performed: CYSTOSCOPY WITH RETROGRADE PYELOGRAM/URETERAL STENT PLACEMENT (Left)  Patient Location: PACU  Anesthesia Type:General  Level of Consciousness: drowsy  Airway & Oxygen Therapy: Patient Spontanous Breathing and Patient connected to face mask oxygen  Post-op Assessment: Report given to RN and Post -op Vital signs reviewed and stable  Post vital signs: Reviewed and stable  Last Vitals:  Vitals Value Taken Time  BP 100/71 11/13/21 1623  Temp    Pulse 62 11/13/21 1626  Resp 12 11/13/21 1626  SpO2 100 % 11/13/21 1626  Vitals shown include unvalidated device data.  Last Pain:  Vitals:   11/13/21 1410  TempSrc:   PainSc: 0-No pain         Complications: No notable events documented.

## 2021-11-14 ENCOUNTER — Encounter (HOSPITAL_COMMUNITY): Payer: Self-pay | Admitting: Urology

## 2021-11-15 LAB — URINE CULTURE: Culture: 60000 — AB

## 2021-11-17 NOTE — Anesthesia Postprocedure Evaluation (Signed)
Anesthesia Post Note  Patient: Rachel [name]  Procedure(s) Performed: CYSTOSCOPY WITH RETROGRADE PYELOGRAM/URETERAL STENT PLACEMENT (Left)     Patient location during evaluation: PACU Anesthesia Type: General Level of consciousness: awake and alert Pain management: pain level controlled Vital Signs Assessment: post-procedure vital signs reviewed and stable Respiratory status: spontaneous breathing, nonlabored ventilation, respiratory function stable and patient connected to nasal cannula oxygen Cardiovascular status: blood pressure returned to baseline and stable Postop Assessment: no apparent nausea or vomiting Anesthetic complications: no   No notable events documented.  Last Vitals:  Vitals:   11/13/21 1645 11/13/21 1700  BP: 98/76 107/73  Pulse: 70 72  Resp: 18 20  Temp:  (!) 36.4 C  SpO2: 96% 98%    Last Pain:  Vitals:   11/13/21 1700  TempSrc:   PainSc: 0-No pain                 Elan Brainerd S

## 2021-11-28 ENCOUNTER — Encounter (HOSPITAL_BASED_OUTPATIENT_CLINIC_OR_DEPARTMENT_OTHER): Payer: Self-pay | Admitting: Urology

## 2021-12-01 ENCOUNTER — Other Ambulatory Visit: Payer: Medicare HMO

## 2021-12-01 DIAGNOSIS — E039 Hypothyroidism, unspecified: Secondary | ICD-10-CM

## 2021-12-02 ENCOUNTER — Encounter (HOSPITAL_BASED_OUTPATIENT_CLINIC_OR_DEPARTMENT_OTHER): Payer: Self-pay | Admitting: Urology

## 2021-12-02 LAB — TSH: TSH: 0.555 u[IU]/mL (ref 0.450–4.500)

## 2021-12-02 LAB — HM MAMMOGRAPHY: HM Mammogram: ABNORMAL — AB (ref 0–4)

## 2021-12-02 NOTE — Progress Notes (Signed)
Spoke w/ via phone for pre-op interview--- pt Lab needs dos----  [employer] results------ current ekg in epic/ chart COVID test -----patient states asymptomatic no test needed Arrive at ------- 1130 on 12-05-2021 NPO after MN NO Solid Food.  Clear liquids from MN until--- 1030 Med rec completed Medications to take morning of surgery ----- synthroid, pepcid Diabetic medication ----- n/a Patient instructed no nail polish to be worn day of surgery Patient instructed to bring photo id and insurance card day of surgery Patient aware to have Driver (ride ) / caregiver for 24 hours after surgery -- husband, Rachel [name] Patient Special Instructions ----- n/a Pre-Op special Istructions ----- n/a Patient verbalized understanding of instructions that were given at this phone interview. Patient denies shortness of breath, chest pain, fever, cough at this phone interview.

## 2021-12-05 ENCOUNTER — Ambulatory Visit (HOSPITAL_BASED_OUTPATIENT_CLINIC_OR_DEPARTMENT_OTHER): Payer: Medicare HMO | Admitting: Anesthesiology

## 2021-12-05 ENCOUNTER — Ambulatory Visit (HOSPITAL_BASED_OUTPATIENT_CLINIC_OR_DEPARTMENT_OTHER)
Admission: RE | Admit: 2021-12-05 | Discharge: 2021-12-05 | Disposition: A | Discharge status: Home / Self Care | Payer: Medicare HMO | Attending: Urology | Admitting: Urology

## 2021-12-05 ENCOUNTER — Encounter (HOSPITAL_BASED_OUTPATIENT_CLINIC_OR_DEPARTMENT_OTHER): Payer: Self-pay | Admitting: Urology

## 2021-12-05 ENCOUNTER — Encounter (HOSPITAL_BASED_OUTPATIENT_CLINIC_OR_DEPARTMENT_OTHER)
Admission: RE | Disposition: A | Discharge status: Home / Self Care | Payer: Self-pay | Source: Home / Self Care | Attending: Urology

## 2021-12-05 ENCOUNTER — Other Ambulatory Visit: Payer: Self-pay

## 2021-12-05 DIAGNOSIS — N200 Calculus of kidney: Secondary | ICD-10-CM | POA: Diagnosis present

## 2021-12-05 DIAGNOSIS — Z79899 Other long term (current) drug therapy: Secondary | ICD-10-CM | POA: Insufficient documentation

## 2021-12-05 DIAGNOSIS — M810 Age-related osteoporosis without current pathological fracture: Secondary | ICD-10-CM | POA: Diagnosis not present

## 2021-12-05 DIAGNOSIS — Z01818 Encounter for other preprocedural examination: Secondary | ICD-10-CM

## 2021-12-05 DIAGNOSIS — Z87440 Personal history of urinary (tract) infections: Secondary | ICD-10-CM | POA: Insufficient documentation

## 2021-12-05 DIAGNOSIS — I129 Hypertensive chronic kidney disease with stage 1 through stage 4 chronic kidney disease, or unspecified chronic kidney disease: Secondary | ICD-10-CM | POA: Diagnosis not present

## 2021-12-05 DIAGNOSIS — R7303 Prediabetes: Secondary | ICD-10-CM | POA: Diagnosis not present

## 2021-12-05 DIAGNOSIS — K219 Gastro-esophageal reflux disease without esophagitis: Secondary | ICD-10-CM | POA: Diagnosis not present

## 2021-12-05 DIAGNOSIS — N1831 Chronic kidney disease, stage 3a: Secondary | ICD-10-CM | POA: Insufficient documentation

## 2021-12-05 DIAGNOSIS — Z87891 Personal history of nicotine dependence: Secondary | ICD-10-CM | POA: Diagnosis not present

## 2021-12-05 DIAGNOSIS — E039 Hypothyroidism, unspecified: Secondary | ICD-10-CM | POA: Insufficient documentation

## 2021-12-05 HISTORY — DX: Overactive bladder: N32.81

## 2021-12-05 HISTORY — DX: Unspecified glaucoma: H40.9

## 2021-12-05 HISTORY — DX: Iron deficiency anemia, unspecified: D50.9

## 2021-12-05 HISTORY — DX: Mastodynia: N64.4

## 2021-12-05 HISTORY — DX: Presence of dental prosthetic device (complete) (partial): Z97.2

## 2021-12-05 HISTORY — DX: Presence of spectacles and contact lenses: Z97.3

## 2021-12-05 HISTORY — DX: Personal history of peptic ulcer disease: Z87.11

## 2021-12-05 HISTORY — DX: Hypertensive chronic kidney disease with stage 1 through stage 4 chronic kidney disease, or unspecified chronic kidney disease: I12.9

## 2021-12-05 HISTORY — DX: Diaphragmatic hernia without obstruction or gangrene: K44.9

## 2021-12-05 HISTORY — PX: CYSTOSCOPY/URETEROSCOPY/HOLMIUM LASER/STENT PLACEMENT: SHX6546

## 2021-12-05 HISTORY — DX: Insomnia, unspecified: G47.00

## 2021-12-05 HISTORY — DX: Calculus of kidney: N20.0

## 2021-12-05 HISTORY — DX: Chronic kidney disease, stage 3 unspecified: N18.30

## 2021-12-05 LAB — POCT I-STAT, CHEM 8
BUN: 35 mg/dL — ABNORMAL HIGH (ref 8–23)
Calcium, Ion: 1.07 mmol/L — ABNORMAL LOW (ref 1.15–1.40)
Chloride: 109 mmol/L (ref 98–111)
Creatinine, Ser: 1.1 mg/dL — ABNORMAL HIGH (ref 0.44–1.00)
Glucose, Bld: 102 mg/dL — ABNORMAL HIGH (ref 70–99)
HCT: 47 % — ABNORMAL HIGH (ref 36.0–46.0)
Hemoglobin: 16 g/dL — ABNORMAL HIGH (ref 12.0–15.0)
Potassium: 5.2 mmol/L — ABNORMAL HIGH (ref 3.5–5.1)
Sodium: 139 mmol/L (ref 135–145)
TCO2: 22 mmol/L (ref 22–32)

## 2021-12-05 SURGERY — CYSTOSCOPY/URETEROSCOPY/HOLMIUM LASER/STENT PLACEMENT
Anesthesia: General | Site: Renal | Laterality: Left

## 2021-12-05 MED ORDER — EPHEDRINE SULFATE-NACL 50-0.9 MG/10ML-% IV SOSY
PREFILLED_SYRINGE | INTRAVENOUS | Status: DC | PRN
  Administered 2021-12-05: 15 mg via INTRAVENOUS
  Administered 2021-12-05: 10 mg via INTRAVENOUS

## 2021-12-05 MED ORDER — CEFAZOLIN SODIUM-DEXTROSE 2-4 GM/100ML-% IV SOLN
INTRAVENOUS | Status: AC
  Filled 2021-12-05: qty 100

## 2021-12-05 MED ORDER — AMISULPRIDE (ANTIEMETIC) 5 MG/2ML IV SOLN: 10.0000 mg | Freq: Once | INTRAVENOUS | Status: DC | PRN

## 2021-12-05 MED ORDER — PROPOFOL 10 MG/ML IV BOLUS
INTRAVENOUS | Status: DC | PRN
  Administered 2021-12-05: 40 mg via INTRAVENOUS
  Administered 2021-12-05: 160 mg via INTRAVENOUS

## 2021-12-05 MED ORDER — FENTANYL CITRATE (PF) 100 MCG/2ML IJ SOLN
INTRAMUSCULAR | Status: AC
  Filled 2021-12-05: qty 2

## 2021-12-05 MED ORDER — ONDANSETRON HCL 4 MG/2ML IJ SOLN
INTRAMUSCULAR | Status: AC
  Filled 2021-12-05: qty 2

## 2021-12-05 MED ORDER — ACETAMINOPHEN 500 MG PO TABS
1000.0000 mg | ORAL_TABLET | Freq: Once | ORAL | Status: AC
  Administered 2021-12-05: 1000 mg via ORAL

## 2021-12-05 MED ORDER — IOHEXOL 300 MG/ML  SOLN
INTRAMUSCULAR | Status: DC | PRN
  Administered 2021-12-05: 10 mL

## 2021-12-05 MED ORDER — SODIUM CHLORIDE 0.9 % IV SOLN: INTRAVENOUS | Status: DC

## 2021-12-05 MED ORDER — CEFAZOLIN SODIUM-DEXTROSE 2-4 GM/100ML-% IV SOLN
2.0000 g | Freq: Once | INTRAVENOUS | Status: AC
  Administered 2021-12-05: 2 g via INTRAVENOUS

## 2021-12-05 MED ORDER — LIDOCAINE HCL (PF) 2 % IJ SOLN
INTRAMUSCULAR | Status: AC
  Filled 2021-12-05: qty 5

## 2021-12-05 MED ORDER — EPHEDRINE 5 MG/ML INJ
INTRAVENOUS | Status: AC
  Filled 2021-12-05: qty 5

## 2021-12-05 MED ORDER — FENTANYL CITRATE (PF) 100 MCG/2ML IJ SOLN: 25.0000 ug | INTRAMUSCULAR | Status: DC | PRN

## 2021-12-05 MED ORDER — FENTANYL CITRATE (PF) 100 MCG/2ML IJ SOLN
INTRAMUSCULAR | Status: DC | PRN
  Administered 2021-12-05 (×2): 50 ug via INTRAVENOUS

## 2021-12-05 MED ORDER — ONDANSETRON HCL 4 MG/2ML IJ SOLN
INTRAMUSCULAR | Status: DC | PRN
  Administered 2021-12-05: 4 mg via INTRAVENOUS

## 2021-12-05 MED ORDER — DEXAMETHASONE SODIUM PHOSPHATE 10 MG/ML IJ SOLN
INTRAMUSCULAR | Status: AC
  Filled 2021-12-05: qty 1

## 2021-12-05 MED ORDER — SODIUM CHLORIDE 0.9 % IR SOLN
Status: DC | PRN
  Administered 2021-12-05 (×2): 3000 mL

## 2021-12-05 MED ORDER — LIDOCAINE 2% (20 MG/ML) 5 ML SYRINGE
INTRAMUSCULAR | Status: DC | PRN
  Administered 2021-12-05: 60 mg via INTRAVENOUS

## 2021-12-05 MED ORDER — DEXAMETHASONE SODIUM PHOSPHATE 10 MG/ML IJ SOLN
INTRAMUSCULAR | Status: DC | PRN
  Administered 2021-12-05: 5 mg via INTRAVENOUS

## 2021-12-05 MED ORDER — PROPOFOL 10 MG/ML IV BOLUS
INTRAVENOUS | Status: AC
  Filled 2021-12-05: qty 20

## 2021-12-05 MED ORDER — ACETAMINOPHEN 500 MG PO TABS
ORAL_TABLET | ORAL | Status: AC
  Filled 2021-12-05: qty 2

## 2021-12-05 SURGICAL SUPPLY — 29 items
BAG DRAIN URO-CYSTO SKYTR STRL (DRAIN) ×3 IMPLANT
BAG DRN UROCATH (DRAIN) ×1
BASKET LASER NITINOL 1.9FR (BASKET) ×1 IMPLANT
BASKET ZERO TIP NITINOL 2.4FR (BASKET) IMPLANT
BSKT STON RTRVL 120 1.9FR (BASKET) ×1
BSKT STON RTRVL ZERO TP 2.4FR (BASKET)
CATH SET URETHRAL DILATOR (CATHETERS) IMPLANT
CATH URETL OPEN 5X70 (CATHETERS) ×3 IMPLANT
CLOTH BEACON ORANGE TIMEOUT ST (SAFETY) ×3 IMPLANT
FIBER LASER FLEXIVA 365 (UROLOGICAL SUPPLIES) IMPLANT
GLOVE BIO SURGEON STRL SZ7 (GLOVE) ×3 IMPLANT
GOWN STRL REUS W/TWL LRG LVL3 (GOWN DISPOSABLE) ×3 IMPLANT
GUIDEWIRE STR DUAL SENSOR (WIRE) ×4 IMPLANT
GUIDEWIRE ZIPWRE .038 STRAIGHT (WIRE) IMPLANT
IV NS 1000ML (IV SOLUTION) ×2
IV NS 1000ML BAXH (IV SOLUTION) ×2 IMPLANT
IV NS IRRIG 3000ML ARTHROMATIC (IV SOLUTION) ×4 IMPLANT
KIT TURNOVER CYSTO (KITS) ×3 IMPLANT
MANIFOLD NEPTUNE II (INSTRUMENTS) ×3 IMPLANT
NS IRRIG 500ML POUR BTL (IV SOLUTION) ×3 IMPLANT
PACK CYSTO (CUSTOM PROCEDURE TRAY) ×3 IMPLANT
SHEATH URETERAL 12FRX35CM (MISCELLANEOUS) ×1 IMPLANT
STENT URET 6FRX28 CONTOUR (STENTS) ×1 IMPLANT
SYR 10ML LL (SYRINGE) ×3 IMPLANT
TRACTIP FLEXIVA PULS ID 200XHI (Laser) IMPLANT
TRACTIP FLEXIVA PULSE ID 200 (Laser) ×2
TUBE CONNECTING 12X1/4 (SUCTIONS) ×3 IMPLANT
TUBE FEEDING 8FR 16IN STR KANG (MISCELLANEOUS) IMPLANT
TUBING UROLOGY SET (TUBING) ×3 IMPLANT

## 2021-12-05 NOTE — Discharge Instructions (Signed)
May take Tylenol as needed for pain beginning at 6:30 PM this evening.  [facility] [phone] Post Ureteroscopy With Stent Instructions  Definitions:  Ureter: The duct that transports urine from the kidney to the bladder. Stent:   A plastic hollow tube that is placed into the ureter, from the kidney to the bladder to prevent the ureter from swelling shut.  GENERAL INSTRUCTIONS:  Despite the fact that no skin incisions were used, the area around the ureter and bladder is raw and irritated. The stent is a foreign body which will further irritate the bladder wall. This irritation is manifested by increased frequency of urination, both day and night, and by an increase in the urge to urinate. In some, the urge to urinate is present almost always. Sometimes the urge is strong enough that you may not be able to stop yourself from urinating. The only real cure is to remove the stent and then give time for the bladder wall to heal which can't be done until the danger of the ureter swelling shut has passed, which varies.  You may see some blood in your urine while the stent is in place and a few days afterwards. Do not be alarmed, even if the urine was clear for a while. Get off your feet and drink lots of fluids until clearing occurs. If you start to pass clots or don't improve, call us.  DIET: You may return to your normal diet immediately. Because of the raw surface of your bladder, alcohol, spicy foods, acid type foods and drinks with caffeine may cause irritation or frequency and should be used in moderation. To keep your urine flowing freely and to avoid constipation, drink plenty of fluids during the day ( 8-10 glasses ). Tip: Avoid cranberry juice because it is very acidic.  ACTIVITY: Your physical activity doesn't need to be restricted. However, if you are very active, you may see some blood in your urine. We suggest that you reduce your activity under these circumstances  until the bleeding has stopped.  BOWELS: It is important to keep your bowels regular during the postoperative period. Straining with bowel movements can cause bleeding. A bowel movement every other day is reasonable. Use a mild laxative if needed, such as Milk of Magnesia 2-3 tablespoons, or 2 Dulcolax tablets. Call if you continue to have problems. If you have been taking narcotics for pain, before, during or after your surgery, you may be constipated. Take a laxative if necessary.   MEDICATION: You should resume your pre-surgery medications unless told not to. In addition you will often be given an antibiotic to prevent infection. These should be taken as prescribed until the bottles are finished unless you are having an unusual reaction to one of the drugs.  PROBLEMS YOU SHOULD REPORT TO [place]: Fevers over 100.5 Fahrenheit. Heavy bleeding, or clots ( See above notes about blood in urine ). Inability to urinate. Drug reactions ( hives, rash, nausea, vomiting, diarrhea ). Severe burning or pain with urination that is not improving.  FOLLOW-UP: You will need a follow-up appointment to monitor your progress. Call for this appointment at the number listed above. Usually the first appointment will be about three to fourteen days after your surgery.     Post Anesthesia Home Care Instructions  Activity: Get plenty of rest for the remainder of the day. A responsible individual must stay with you for 24 hours following the procedure.  For the next 24 hours, DO NOT: -Drive a car -  Operate machinery -Drink alcoholic beverages -Take any medication unless instructed by your physician -Make any legal decisions or sign important papers.  Meals: Start with liquid foods such as gelatin or soup. Progress to regular foods as tolerated. Avoid greasy, spicy, heavy foods. If nausea and/or vomiting occur, drink only clear liquids until the nausea and/or vomiting subsides. Call your physician if vomiting  continues.  Special Instructions/Symptoms: Your throat may feel dry or sore from the anesthesia or the breathing tube placed in your throat during surgery. If this causes discomfort, gargle with warm salt water. The discomfort should disappear within 24 hours.

## 2021-12-05 NOTE — Op Note (Signed)
Operative Note  Preoperative diagnosis:  1.  Left renal stones  Postoperative diagnosis: 1.  Left renal stones  Procedure(s): 1.  Cystoscopy 2. Left ureteroscopy with laser lithotripsy and basket extraction of stones 3. Left retrograde pyelogram 4. Left ureteral stent exchange 5. Fluoroscopy with intraoperative interpretation  Surgeon: Rexene Alberts, MD  Assistants:  None  Anesthesia:  General  Complications:  None  EBL:  Minimal  Specimens: 1. Stones for stone analysis (to be done at [facility])  Drains/Catheters: 1.  6Fr x 28cm ureteral stent without a tether string  Intraoperative findings:   Cystoscopy demonstrated no suspicious bladder lesions. Left ureteroscopy demonstrated 1.3 cm left renal pelvis stone and 70m left lower pole stone, most of which was dusted and all large fragments removed.  Left retrograde pyelogram with no hydronephrosis. Successful stent placement.  Indication:  SDESHANNON SEIDEis a 71y.o. female with left renal stones measuring 1.3cm in the renal pelvis and 875min the left lower pole. She is being brought for definitive management. She understands that this may be a staged procedure.  Description of procedure: After informed consent was obtained from the patient, the patient was identified and taken to the operating room and placed in the supine position.  General anesthesia was administered as well as perioperative IV antibiotics.  At the beginning of the case, a time-out was performed to properly identify the patient, the surgery to be performed, and the surgical site.  Sequential compression devices were applied to the lower extremities at the beginning of the case for DVT prophylaxis.  The patient was then placed in the dorsal lithotomy supine position, prepped and draped in sterile fashion.  Preliminary scout fluoroscopy revealed that there was a 1.3cm calcification area at the left renal pelvis and smaller calcification at left lower  pole, which corresponds to the  stone found on the preoperative CT scan. We then passed the 21-French rigid cystoscope through the urethra and into the bladder under vision without any difficulty, noting a normal urethra without strictures.  A systematic evaluation of the bladder revealed no evidence of any suspicious bladder lesions.  Ureteral orifices were in normal position.    The distal aspect of the ureteral stent was seen protruding from the left ureteral orifice.  We then used the alligator-tooth forceps and grasped the distal end of the ureteral stent and brought it out the urethral meatus while watching the proximal coil straighten out nicely on fluoroscopy. Through the ureteral stent, we then passed a 0.038 sensor wire up to the level of the renal pelvis.  The ureteral stent was then removed, leaving the sensor wire up the left ureter.    Under cystoscopic and flouroscopic guidance, we cannulated the left ureteral orifice with a 5-French open-ended ureteral catheter and a gentle retrograde pyelogram was performed, revealing a normal caliber ureter without any filling defects. There was no hydronephrosis of the collecting system.   A semi-rigid ureteroscope was passed alongside the wire up the distal ureter which appeared normal. A second 0.038 sensor wire was passed under direct vision and the semirigid scope was removed. A 12/14 Fr ureteral access sheath was carefully advanced up the ureter to the level of the UPJ over this wire under fluoroscopic guidance. The flexible ureteroscope was advanced into the collecting system via the access sheath. The collecting system was inspected. The calculus was identified at the renal pelvis and left lower pole. Using the 200 micron holmium laser fiber, the stone was mostly dusted. A  1.9 Fr escape basket was used to remove the fragments under visual guidance. These were sent for chemical analysis. With the ureteroscope in the kidney, a gentle pyelogram was  performed to delineate the calyceal system and we evaluated the calyces systematically. We encountered a no further large stone fragments. The rest of the stone fragments were very tiny and these were  irrigated away gently. The calyces were re-inspected and there were no significant stone fragment residual.   We then withdrew the ureteroscope back down the ureter along with the access sheath, noting no evidence of any stones along the course of the ureter.  Prior to removing the ureteroscope, we did pass the Glidewire back up to the ureter to the renal pelvis.  Once the ureteroscope was removed, we then used the Glidewire under fluoroscopic guidance and passed up a 6-French x 28 cm double-pigtail ureteral stent up the ureter, making sure that the proximal and distal ends coiled within the kidney and bladder respectively.  The cystoscope was then advanced back into the bladder under vision.  We were able to see the distal stent coiling nicely within the bladder.  The bladder was then emptied with irrigation solution.  The cystoscope was then removed.    The patient tolerated the procedure well and there was no complication. Patient was awoken from anesthesia and taken to the recovery room in stable condition. I was present and scrubbed for the entirety of the case.  Plan:  Patient will be discharged home.  Follow up with me in 7 to 10 days for stent removal in the office.   Matt R. [facility]  Pager: [phone]

## 2021-12-05 NOTE — Anesthesia Preprocedure Evaluation (Signed)
Anesthesia Evaluation  Patient identified by MRN, date of birth, ID band Patient awake    Reviewed: Allergy & Precautions, NPO status , Patient's Chart, lab work & pertinent test results  Airway Mallampati: II  TM Distance: >3 FB Neck ROM: Full    Dental  (+) Dental Advisory Given   Pulmonary former smoker,    breath sounds clear to auscultation       Cardiovascular hypertension, Pt. on medications  Rhythm:Regular Rate:Normal     Neuro/Psych Hx cerebral aneurysm bleed s/p craniotomy and clipping.  Neuromuscular disease    GI/Hepatic Neg liver ROS, hiatal hernia, GERD  ,  Endo/Other  Hypothyroidism   Renal/GU CRFRenal disease     Musculoskeletal   Abdominal   Peds  Hematology negative hematology ROS (+)   Anesthesia Other Findings   Reproductive/Obstetrics                             Anesthesia Physical Anesthesia Plan  ASA: 2  Anesthesia Plan: General   Post-op Pain Management: Tylenol PO (pre-op)*   Induction: Intravenous  PONV Risk Score and Plan: 3 and Dexamethasone, Ondansetron and Treatment may vary due to age or medical condition  Airway Management Planned: LMA  Additional Equipment: None  Intra-op Plan:   Post-operative Plan: Extubation in OR  Informed Consent: I have reviewed the patients History and Physical, chart, labs and discussed the procedure including the risks, benefits and alternatives for the proposed anesthesia with the patient or authorized representative who has indicated his/her understanding and acceptance.     Dental advisory given  Plan Discussed with: CRNA  Anesthesia Plan Comments:         Anesthesia Quick Evaluation

## 2021-12-05 NOTE — Anesthesia Procedure Notes (Signed)
Procedure Name: LMA Insertion Date/Time: 12/05/2021 1:23 PM  Performed by: Mechele Claude, CRNAPre-anesthesia Checklist: Patient identified, Emergency Drugs available, Suction available and Patient being monitored Patient Re-evaluated:Patient Re-evaluated prior to induction Oxygen Delivery Method: Circle system utilized Preoxygenation: Pre-oxygenation with 100% oxygen Induction Type: IV induction Ventilation: Mask ventilation without difficulty LMA: LMA inserted LMA Size: 4.0 Number of attempts: 1 Airway Equipment and Method: Bite block Placement Confirmation: positive ETCO2 Tube secured with: Tape Dental Injury: Teeth and Oropharynx as per pre-operative assessment

## 2021-12-05 NOTE — Transfer of Care (Signed)
Immediate Anesthesia Transfer of Care Note  Patient: Rachel [name]  Procedure(s) Performed: CYSTOSCOPY/ RETROGRADE/URETEROSCOPY/HOLMIUM LASER/STENT PLACEMENT (Left: Renal)  Patient Location: PACU  Anesthesia Type:General  Level of Consciousness: sedated  Airway & Oxygen Therapy: Patient Spontanous Breathing and Patient connected to nasal cannula oxygen  Post-op Assessment: Report given to RN  Post vital signs: Reviewed and stable  Last Vitals:  Vitals Value Taken Time  BP 130/84 12/05/21 1448  Temp    Pulse 69 12/05/21 1450  Resp 11 12/05/21 1450  SpO2 99 % 12/05/21 1450  Vitals shown include unvalidated device data.  Last Pain:  Vitals:   12/05/21 1205  TempSrc: Oral  PainSc: 0-No pain      Patients Stated Pain Goal: 5 (25/42/70 6237)  Complications: No notable events documented.

## 2021-12-05 NOTE — H&P (Signed)
Office Visit Report     11/10/2021    CC/HPI: Rachel [name] is a 71 year old female who is seen in follow-up today with urolithiasis and filling defects within her left collecting system.   #1. Bilateral hydronephrosis:  -She has a history of stage IIIa chronic kidney disease. Creatinine on 06/17/2021 was 1.1 with EGFR 51. Due to history of CKD, she underwent renal ultrasound 10/07/2021 that incidentally revealed mild right hydronephrosis and moderate left hydronephrosis. She was also found to have a 1.5 cm nonobstructing stone in the lower pole the left kidney. Previous ultrasounds have demonstrated mild parenchymal atrophy bilaterally.  CT A/P 10/29/2021 with moderate to severe left-sided hydronephrosis secondary to a 1.3 cm left UPJ stone. She also had multiple filling defects in the left renal collecting system and left ureter favored to represent blood clots. She also had an 8 mm nonobstructing stone in the left lower pole. There is no stones in the right side or right-sided hydronephrosis  -She denies abdominal pain or flank pain. She denies gross hematuria.  -Urinalysis today is without microscopic hematuria.   #2. Urolithiasis: Renal ultrasound 09/2021 demonstrates 1.5 cm nonobstructing stone in the lower left pole of the kidney. Of note, she had CT A/P in 2017 demonstrating 7 mm left lower pole stone. CT A/P 10/29/2021 with 1.3 cm left UPJ stone and 8 mm left lower pole stone. She denies passing stones. She denies surgical intervention for stones. She denies abdominal pain or flank pain.   She does states she has had 2 UTIs over the past 4 months. She denies other prior history of recurrent urinary tract infections. She denies hospitalizations for UTIs. She denies pyelonephritis.   Patient currently denies fever, chills, sweats, nausea, vomiting, abdominal or flank pain, gross hematuria or dysuria.   She has a past medical history of hypertension, prediabetes, hypothyroidism, osteoporosis, history  of brain aneurysm, GERD.   Her brother-in-law is Dr. Gerrit Friends, MD.     ALLERGIES: Dilantin    MEDICATIONS: Levothyroxine Sodium 100 mcg tablet  Simvastatin  Calcium Carbonate  Combigan 0.2 %-0.5 % drops  Famotidine  Iron  Magnesium  Prolia 60 mg/ml syringe  Solifenacin Succinate 5 mg tablet  Temazepam 15 mg capsule  Valsartan-Hydrochlorothiazide  Vitamin D3     GU PSH: Locm 300-399Mg/Ml Iodine,1Ml - 10/29/2021     NON-GU PSH: Brain Aneurysm Repr; Simple - 2005 Partial Hysterectomy     GU PMH: Hydronephrosis - 10/29/2021, - 10/23/2021 Renal calculus - 10/23/2021    NON-GU PMH: GERD Glaucoma Hypercholesterolemia Hypertension    FAMILY HISTORY: 1 son - Runs in Family Alzheimer's Disease - Father Aortic Aneurysm - Mother Breast Cancer - Sister, Mother Prostate Cancer - Father   SOCIAL HISTORY: Marital Status: Married Ethnicity: Not Hispanic Or Latino; Race: Black or African American Current Smoking Status: Patient does not smoke anymore. Has not smoked since 09/26/2003.   Tobacco Use Assessment Completed: Used Tobacco in last 30 days? Has never drank.  Drinks 1 caffeinated drink per day.    REVIEW OF SYSTEMS:    GU Review Female:   Patient denies frequent urination, hard to postpone urination, burning /pain with urination, get up at night to urinate, leakage of urine, stream starts and stops, trouble starting your stream, have to strain to urinate, and being pregnant.  Gastrointestinal (Upper):   Patient denies nausea, vomiting, and indigestion/ heartburn.  Gastrointestinal (Lower):   Patient denies diarrhea and constipation.  Constitutional:   Patient denies fever, fatigue, night sweats, and weight loss.  Skin:   Patient denies skin rash/ lesion and itching.  Eyes:   Patient denies blurred vision and double vision.  Ears/ Nose/ Throat:   Patient denies sore throat and sinus problems.  Hematologic/Lymphatic:   Patient denies swollen glands and easy bruising.   Cardiovascular:   Patient denies leg swelling and chest pains.  Respiratory:   Patient denies cough and shortness of breath.  Endocrine:   Patient denies excessive thirst.  Musculoskeletal:   Patient denies back pain and joint pain.  Neurological:   Patient denies headaches and dizziness.  Psychologic:   Patient denies depression and anxiety.   VITAL SIGNS: None   MULTI-SYSTEM PHYSICAL EXAMINATION:    Constitutional: Well-nourished. No physical deformities. Normally developed. Good grooming.  Respiratory: No labored breathing, no use of accessory muscles.   Cardiovascular: Normal temperature, normal extremity pulses, no swelling, no varicosities.  Gastrointestinal: No mass, no tenderness, no rigidity, non obese abdomen.     Complexity of Data:  Source Of History:  Patient, Medical Record Summary  Records Review:   Previous Doctor Records, Previous Patient Records  Urine Test Review:   Urinalysis  X-Ray Review: C.T. Abdomen/Pelvis: Reviewed Films. Reviewed Report. Discussed With Patient.    Notes:                     LINICAL DATA: Evaluate hydronephrosis.   EXAM:  CT ABDOMEN AND PELVIS WITHOUT AND WITH CONTRAST   TECHNIQUE:  Multidetector CT imaging of the abdomen and pelvis was performed  following the standard protocol before and following the bolus  administration of intravenous contrast.   RADIATION DOSE REDUCTION: This exam was performed according to the  departmental dose-optimization program which includes automated  exposure control, adjustment of the mA and/or kV according to  patient size and/or use of iterative reconstruction technique.   CONTRAST: 125 cc of Omnipaque 300   COMPARISON: Ultrasound 10/06/2021.   FINDINGS:  Lower chest: No acute abnormality. Chronic peripheral predominant  interstitial reticulation is identified.   Hepatobiliary: Low-density structure in lateral right lobe of liver  is too small to characterize measuring 6 mm, image 18/5. Small   stones or sludge noted within the dependent portion of the  gallbladder. Focal area of enhancement with peripheral calcification  is noted involving the gallbladder fundus, image 34/601. Focal area  of cystic changes noted within this area. No signs of gallbladder  wall inflammation or bile duct dilatation.   Pancreas: Unremarkable. No pancreatic ductal dilatation or  surrounding inflammatory changes.   Spleen: Normal in size without focal abnormality.   Adrenals/Urinary Tract: Normal adrenal glands. Small bilateral too  small to reliably characterize kidney cysts are noted bilaterally.   There are multiple parapelvic cysts within the right kidney. No  right-sided hydronephrosis, nephrolithiasis or hydroureter.   Moderate to severe left hydronephrosis is identified. Obstructing  stone at the left UPJ measures 1.3 cm, image 74/601. On the delayed  images there are several filling defects identified within the  dilated collecting systems, which are favored to represent blood  clots. There is asymmetric left-sided urothelial enhancement  involving the left renal collecting system and left ureter.  Nonobstructing stone is noted within the inferior pole of the left  kidney measuring 0.8 cm.   Urinary bladder is unremarkable.   Stomach/Bowel: Moderate to large hiatal hernia. No bowel wall  thickening, inflammation, or distension. Sigmoid diverticulosis  without signs of acute diverticulitis.   Vascular/Lymphatic: Aortic atherosclerosis. No aneurysm. No upper  abdominal adenopathy.  Reproductive: The uterus appears retroflexed towards the right  posterior pelvis. No adnexal mass identified.   Other: No free fluid or fluid collections.   Musculoskeletal: No acute or significant osseous findings.   IMPRESSION:  1. Moderate to severe left-sided hydronephrosis secondary to 1.3 cm  left UPJ calculus.  2. Multiple filling defects identified within the left renal  collecting system  and left ureter which are favored to represent  blood clots.  3. Moderate to large hiatal hernia.  4. Focal area of enhancement with peripheral calcification and  cystic change involving the gallbladder fundus is noted. This is  favored to represent a benign abnormality such as adenomyomatosis.  Advise follow-up imaging with nonemergent, contrast enhanced MRI of  the abdomen.  5. Chronic peripheral predominant interstitial reticulation within  the lung bases.  6. Aortic Atherosclerosis (ICD10-I70.0).    Electronically Signed  By: Kerby Moors M.D.  On: 10/30/2021 07:32   PROCEDURES:          Urinalysis w/Scope - 81001 Dipstick Dipstick Cont'd Micro  Color: Yellow Bilirubin: Neg WBC/hpf: >60/hpf  Appearance: Cloudy Ketones: Neg RBC/hpf: 3 - 10/hpf  Specific Gravity: 1.020 Blood: 3+ Bacteria: Moderate (26-50/hpf)  pH: 5.5 Protein: Trace Cystals: NS (Not Seen)  Glucose: Neg Urobilinogen: 0.2 Casts: NS (Not Seen)    Nitrites: Neg Trichomonas: Not Present    Leukocyte Esterase: 3+ Mucous: Not Present      Epithelial Cells: 0 - 5/hpf      Yeast: NS (Not Seen)      Sperm: Not Present    ASSESSMENT:      ICD-10 Details  1 GU:   Renal calculus - N20.0   2   Hydronephrosis - N13.0    PLAN:           Orders Labs Urine Cytology, CULTURE, URINE          Document Letter(s):  Created for Patient: Clinical Summary         Notes:   #1. Urolithiasis:  CT A/P 10/29/2021 again reviewed today with evidence of 1.3 cm left UPJ stone and 8 mm left lower pole stone. She is currently scheduled for cystoscopy, left ureteroscopy with laser lithotripsy and basket extraction of stones with possible biopsy on 12/05/2021. This may require staged procedure. Discussed risk and benefits.  -We will obtain preoperative urine for culture today.  -Will send urine for cytology.   We discussed the options for management of kidney stones, including observation, ESWL, ureteroscopy with laser lithotripsy, and  PCNL. The risks and benefits of each option were discussed.  For observation I described the risks which include but are not limited to silent renal damage, life-threatening infection, need for emergent surgery, failure to pass stone, and pain.   ESWL: risks and benefits of ESWL were outlined including infection, bleeding, pain, steinstrasse, kidney injury, need for ancillary treatments, and global anesthesia risks including but not limited to CVA, MI, DVT, PE, pneumonia, and death.   Ureteroscopy: risks and benefits of ureteroscopy were outlined, including infection, bleeding, pain, temporary ureteral stent and associated stent bother, ureteral injury, ureteral stricture, need for ancillary treatments, and global anesthesia risks including but not limited to CVA, MI, DVT, PE, pneumonia, and death.   PCNL: risks and benefits of PCNL were outlined including infection, bleeding, blood transfusion, pain, pneumothorax, bowel injury, persistent urine leak, positioning injury, inability to clear stone burden, renal laceration, arterial venous fistula or malformation, need for ancillary treatments, and global anesthesia risks including but not limited to  CVA, MI, DVT, PE, pneumonia, and death.    We discussed dietary methods for stone prevention including the following: increased water intake to 2-3 liters per day, add lemon or lemon concentrate to water to increase citrate which is beneficial for stone prevention, limiting dietary sodium to less than 2000 mg per day, limiting animal protein to less than 2 servings (16 ounces/day), and limiting foods high in oxalate content (spinach, beans, chocolate, etc.).   I gave her a copy of her CT report. I did discuss that there is an incidental finding of focal area of enhancement with peripheral calcification and cystic change involving the gallbladder fundus. I discussed that this is favored to represent benign abnormality. I advised follow-up with her PCP to obtain  elective MRI to further evaluate.   CC: Cyndee Brightly, MD   Urology Preoperative H&P   Chief Complaint: Left renal stone  History of Present Illness: Rachel [name] is a 71 y.o. female with left renal stones with 1.3 cm left UPJ stone and 8 mm left lower pole stone. She underwent left ureteral stent placement on 11/13/2021. She returns today for definitive management. She denies fevers, chills, dysuria. Ucx 11/24/2021 resulted no growth.    Past Medical History:  Diagnosis Date   CKD (chronic kidney disease), stage III [facility])    nephrologist--- dr l. foster   GERD (gastroesophageal reflux disease)    Glaucoma, right eye    Hiatal hernia    History of gastric ulcer    per egd 2019 non-bleeding   History of spontaneous subarachnoid intracranial hemorrhage due to cerebral aneurysm 03/2004   due to A-com region bilobed aneursym  s/p craniotomy with clipping;  per pt no residual and no issues since   Hypertension    followed by pcp   Hypertensive nephropathy    Hypothyroidism    endocrinologist--- dr Kelton Pillar   IDA (iron deficiency anemia)    Insomnia    Mastodynia of left breast    OAB (overactive bladder)    Osteoporosis    Renal calculus, left    Wears glasses    Wears partial dentures    lower    Past Surgical History:  Procedure Laterality Date   CEREBRAL ANEURYSM REPAIR  03/2004   '@[facility]'  by dr Loretha Stapler   ;   craniotomy w/ clipping to A-com region for bilobed aneurysm (right skull base)   CLOSED REDUCTION NASAL FRACTURE N/A 06/21/2020   Procedure: CLOSED REDUCTION NASAL FRACTURE;  Surgeon: Jason Coop, DO;  Location: [place];  Service: ENT;  Laterality: N/A;   COLONOSCOPY WITH ESOPHAGOGASTRODUODENOSCOPY (EGD)  03/16/2018   CYSTOSCOPY W/ URETERAL STENT PLACEMENT Left 11/13/2021   Procedure: CYSTOSCOPY WITH RETROGRADE PYELOGRAM/URETERAL STENT PLACEMENT;  Surgeon: Janith Lima, MD;  Location: [facility];  Service: Urology;  Laterality: Left;   LAPAROSCOPIC BILATERAL  SALPINGO OOPHERECTOMY Bilateral 11/12/2015   Procedure: LAPAROSCOPIC BILATERAL SALPINGO OOPHORECTOMY;  Surgeon: Everett Graff, MD;  Location: [facility];  Service: Gynecology;  Laterality: Bilateral;   LAPAROSCOPY N/A 11/22/2015   Procedure: LAPAROSCOPY DIAGNOSTIC with repair of serosal tear;  Surgeon: Everett Graff, MD;  Location: [facility];  Service: Gynecology;  Laterality: N/A;   RADIOACTIVE SEED GUIDED EXCISIONAL BREAST BIOPSY Left 09/26/2015   Procedure: LEFT RADIOACTIVE SEED GUIDED EXCISIONAL BREAST BIOPSY;  Surgeon: Stark Klein, MD;  Location: [place];  Service: General;  Laterality: Left;   RADIOACTIVE SEED GUIDED EXCISIONAL BREAST BIOPSY Left 02/20/2020   Procedure: LEFT RADIOACTIVE SEED GUIDED EXCISIONAL BREAST BIOPSY;  Surgeon: Barry Dienes,  Dorris Fetch, MD;  Location: [place];  Service: General;  Laterality: Left;  RNFA   WISDOM TOOTH EXTRACTION      Allergies:  Allergies  Allergen Reactions   Dilantin [Phenytoin Sodium Extended] Rash    Family History  Problem Relation Age of Onset   Cancer Mother        Breast    Hypertension Mother    Cancer Father        Prostate   Hypertension Father    Cancer Sister        Breast    Social History:  reports that she quit smoking about 17 years ago. Her smoking use included cigarettes. She started smoking about 50 years ago. She has a 30.00 pack-year smoking history. She has never used smokeless tobacco. She reports that she does not drink alcohol and does not use drugs.  ROS: A complete review of systems was performed.  All systems are negative except for pertinent findings as noted.  Physical Exam:  Vital signs in last 24 hours:   Constitutional:  Alert and oriented, No acute distress Cardiovascular: Regular rate and rhythm Respiratory: Normal respiratory effort, Lungs clear bilaterally GI: Abdomen is soft, nontender, nondistended, no abdominal masses GU: No CVA tenderness Lymphatic: No lymphadenopathy Neurologic:  Grossly intact, no focal deficits Psychiatric: Normal mood and affect  Laboratory Data:  No results for input(s): "WBC", "HGB", "HCT", "PLT" in the last 72 hours.  No results for input(s): "NA", "K", "CL", "GLUCOSE", "BUN", "CALCIUM", "CREATININE" in the last 72 hours.  Invalid input(s): "CO3"   No results found for this or any previous visit (from the past 24 hour(s)). No results found for this or any previous visit (from the past 240 hour(s)).  Renal Function: No results for input(s): "CREATININE" in the last 168 hours. CrCl cannot be calculated (Patient's most recent lab result is older than the maximum 21 days allowed.).  Radiologic Imaging: No results found.  I independently reviewed the above imaging studies.  Assessment and Plan TEMIKA SUTPHIN is a 71 y.o. female with left renal stones with 1.3 cm left UPJ stone and 8 mm left lower pole stone. She is here for L URS/LL.  -The risks, benefits and alternatives of cystoscopy with L URS/LL, L  JJ stent exchange was discussed with the patient.  Risks include, but are not limited to: bleeding, urinary tract infection, ureteral injury, ureteral stricture disease, chronic pain, urinary symptoms, bladder injury, stent migration, the need for nephrostomy tube placement, MI, CVA, DVT, PE and the inherent risks with general anesthesia.  The patient voices understanding and wishes to proceed.    Matt R. Kleo Dungee MD 12/05/2021, 10:22 AM  [facility] Pager: [phone]): [phone]

## 2021-12-08 ENCOUNTER — Encounter (HOSPITAL_BASED_OUTPATIENT_CLINIC_OR_DEPARTMENT_OTHER): Payer: Self-pay | Admitting: Urology

## 2021-12-08 NOTE — Anesthesia Postprocedure Evaluation (Signed)
Anesthesia Post Note  Patient: Rachel [name]  Procedure(s) Performed: CYSTOSCOPY/ RETROGRADE/URETEROSCOPY/HOLMIUM LASER/STENT PLACEMENT (Left: Renal)     Patient location during evaluation: PACU Anesthesia Type: General Level of consciousness: awake and alert Pain management: pain level controlled Vital Signs Assessment: post-procedure vital signs reviewed and stable Respiratory status: spontaneous breathing, nonlabored ventilation, respiratory function stable and patient connected to nasal cannula oxygen Cardiovascular status: blood pressure returned to baseline and stable Postop Assessment: no apparent nausea or vomiting Anesthetic complications: no   No notable events documented.  Last Vitals:  Vitals:   12/05/21 1615 12/05/21 1645  BP: 138/86 122/85  Pulse: 63 74  Resp: 17 (!) 22  Temp: (!) 35.8 C (!) 35.6 C  SpO2: 92% 94%    Last Pain:  Vitals:   12/05/21 1645  TempSrc:   PainSc: 0-No pain                 Tiajuana Amass

## 2022-01-11 ENCOUNTER — Other Ambulatory Visit: Payer: Self-pay | Admitting: Internal Medicine

## 2022-01-14 ENCOUNTER — Ambulatory Visit: Payer: Medicare HMO | Admitting: Internal Medicine

## 2022-01-14 ENCOUNTER — Ambulatory Visit: Payer: Medicare HMO

## 2022-03-12 LAB — HM MAMMOGRAPHY: HM Mammogram: NORMAL (ref 0–4)

## 2022-03-12 LAB — HM PAP SMEAR: HM Pap smear: NORMAL

## 2022-03-12 LAB — RESULTS CONSOLE HPV: CHL HPV: NEGATIVE

## 2022-03-16 ENCOUNTER — Other Ambulatory Visit: Payer: Self-pay | Admitting: Internal Medicine

## 2022-03-24 LAB — HM PAP SMEAR

## 2022-04-01 ENCOUNTER — Encounter: Payer: Self-pay | Admitting: Internal Medicine

## 2022-04-02 ENCOUNTER — Encounter: Payer: Self-pay | Admitting: Internal Medicine

## 2022-04-02 ENCOUNTER — Ambulatory Visit (INDEPENDENT_AMBULATORY_CARE_PROVIDER_SITE_OTHER): Payer: Medicare HMO | Admitting: Internal Medicine

## 2022-04-02 VITALS — BP 118/80 | HR 83 | Temp 98.0°F | Ht 61.6 in | Wt 194.8 lb

## 2022-04-02 DIAGNOSIS — I129 Hypertensive chronic kidney disease with stage 1 through stage 4 chronic kidney disease, or unspecified chronic kidney disease: Secondary | ICD-10-CM | POA: Diagnosis not present

## 2022-04-02 DIAGNOSIS — N1831 Chronic kidney disease, stage 3a: Secondary | ICD-10-CM

## 2022-04-02 DIAGNOSIS — Z6836 Body mass index (BMI) 36.0-36.9, adult: Secondary | ICD-10-CM

## 2022-04-02 DIAGNOSIS — Z0000 Encounter for general adult medical examination without abnormal findings: Secondary | ICD-10-CM | POA: Diagnosis not present

## 2022-04-02 DIAGNOSIS — Z23 Encounter for immunization: Secondary | ICD-10-CM | POA: Diagnosis not present

## 2022-04-02 DIAGNOSIS — R7309 Other abnormal glucose: Secondary | ICD-10-CM | POA: Diagnosis not present

## 2022-04-02 DIAGNOSIS — E2839 Other primary ovarian failure: Secondary | ICD-10-CM | POA: Diagnosis not present

## 2022-04-02 LAB — POCT URINALYSIS DIPSTICK
Bilirubin, UA: NEGATIVE
Glucose, UA: NEGATIVE
Ketones, UA: NEGATIVE
Nitrite, UA: NEGATIVE
Protein, UA: NEGATIVE
Spec Grav, UA: 1.025 (ref 1.010–1.025)
Urobilinogen, UA: 0.2 E.U./dL
pH, UA: 5.5 (ref 5.0–8.0)

## 2022-04-02 NOTE — Patient Instructions (Signed)

## 2022-04-02 NOTE — Progress Notes (Signed)
Subjective:     Patient ID: Rachel Rachel [name] , female    DOB: April 30, 1950 , 71 y.o.   MRN: [id number]   Chief Complaint  Patient presents with   Annual Exam   Hypertension    HPI  The patient is here today for a physical exam.  She reports compliance with meds. She denies headaches, chest pain and shortness of breath. She has no specfic concerns or complaints at this time.    Hypertension This is a chronic problem. The current episode started more than 1 year ago. The problem has been gradually improving since onset. The problem is controlled. Pertinent negatives include no blurred vision. Risk factors for coronary artery disease include dyslipidemia, obesity, post-menopausal state and sedentary lifestyle. The current treatment provides moderate improvement. Identifiable causes of hypertension include a thyroid problem.  Thyroid Problem Presents for follow-up visit. Patient reports no visual change. The symptoms have been stable.     Past Medical History:  Diagnosis Date   CKD (chronic kidney disease), stage III [facility])    nephrologist--- dr l. foster   GERD (gastroesophageal reflux disease)    Glaucoma, right eye    Hiatal hernia    History of gastric ulcer    per egd 2019 non-bleeding   History of spontaneous subarachnoid intracranial hemorrhage due to cerebral aneurysm 03/2004   due to A-com region bilobed aneursym  s/p craniotomy with clipping;  per pt no residual and no issues since   Hypertension    followed by pcp   Hypertensive nephropathy    Hypothyroidism    endocrinologist--- dr Kelton Pillar   IDA (iron deficiency anemia)    Insomnia    Mastodynia of left breast    OAB (overactive bladder)    Osteoporosis    Renal calculus, left    Wears glasses    Wears partial dentures    lower     Family History  Problem Relation Age of Onset   Cancer Mother        Breast    Hypertension Mother    Cancer Father        Prostate   Hypertension Father    Cancer Sister         Breast     Current Outpatient Medications:    calcium carbonate (TUMS - DOSED IN MG ELEMENTAL CALCIUM) 500 MG chewable tablet, Chew 4 tablets by mouth 2 (two) times daily as needed for indigestion or heartburn., Disp: , Rfl:    cholecalciferol (VITAMIN D3) 25 MCG (1000 UNIT) tablet, Take 1,000 Units by mouth daily., Disp: , Rfl:    COMBIGAN 0.2-0.5 % ophthalmic solution, Place 1 drop into the right eye 2 (two) times daily., Disp: , Rfl: 6   denosumab (PROLIA) 60 MG/ML SOSY injection, TO BE ADMINISTERED IN PHYSICIAN'S OFFICE. INJECT ONE SYRINGE SUBCUTANEOUSLY ONCE EVERY 6 MONTHS. REFRIGERATE. USE WITHIN 14 DAYS ONCE AT ROOM TEMPERATURE., Disp: 1 mL, Rfl: 2   famotidine (PEPCID) 40 MG tablet, Take 40 mg by mouth 2 (two) times daily., Disp: , Rfl:    Ferrous Gluconate (IRON 27 PO), Take 1 tablet by mouth at bedtime., Disp: , Rfl:    levothyroxine (SYNTHROID) 100 MCG tablet, Take 1 tablet (100 mcg total) by mouth daily before breakfast. (Patient taking differently: Take 100 mcg by mouth daily before breakfast.), Disp: 90 tablet, Rfl: 3   Magnesium 250 MG TABS, Take 250 mg by mouth at bedtime., Disp: , Rfl:    solifenacin (VESICARE) 5 MG tablet, Take  5 mg by mouth at bedtime., Disp: , Rfl:    temazepam (RESTORIL) 15 MG capsule, TAKE 1 CAPSULE BY MOUTH EVERY DAY AT BEDTIME AS NEEDED (Patient taking differently: Take 15 mg by mouth at bedtime as needed for Rachel [name]. TAKE 1 CAPSULE BY MOUTH EVERY DAY AT BEDTIME AS NEEDED), Disp: 30 capsule, Rfl: 1   valsartan-hydrochlorothiazide (DIOVAN-HCT) 80-12.5 MG tablet, TAKE 1 TABLET BY MOUTH EVERY DAY (Patient taking differently: Take 1 tablet by mouth daily.), Disp: 90 tablet, Rfl: 1   simvastatin (ZOCOR) 40 MG tablet, Take 1 tablet (40 mg total) by mouth at bedtime., Disp: 90 tablet, Rfl: 1   Allergies  Allergen Reactions   Dilantin [Phenytoin Sodium Extended] Rash    The patient states she uses post menopausal status for birth control. Last LMP was No  LMP recorded. Patient is postmenopausal.. Negative for Dysmenorrhea. Negative for: breast discharge, breast lump(s), breast pain and breast self exam. Associated symptoms include abnormal vaginal bleeding. Pertinent negatives include abnormal bleeding (hematology), anxiety, decreased libido, depression, difficulty falling Rachel [name], dyspareunia, history of infertility, nocturia, sexual dysfunction, Rachel [name] disturbances, urinary incontinence, urinary urgency, vaginal discharge and vaginal itching. Diet regular.The patient states her exercise level is  intermittent.  . The patient's tobacco use is:  Social History   Tobacco Use  Smoking Status Former   Packs/day: 1.00   Years: 30.00   Total pack years: 30.00   Types: Cigarettes   Start date: 78   Quit date: 04/02/2004   Years since quitting: 18.0  Smokeless Tobacco Never  . She has been exposed to passive smoke. The patient's alcohol use is:  Social History   Substance and Sexual Activity  Alcohol Use No   Review of Systems  Constitutional: Negative.   Eyes: Negative.  Negative for blurred vision.  Respiratory: Negative.    Cardiovascular: Negative.   Gastrointestinal: Negative.   Endocrine: Negative.   Genitourinary: Negative.   Musculoskeletal: Negative.   Skin: Negative.   Neurological: Negative.   Psychiatric/Behavioral: Negative.       Today's Vitals   04/02/22 1404  BP: 118/80  Pulse: 83  Temp: 98 F (36.7 C)  Weight: 194 lb 12.8 oz (88.4 kg)  Height: 5' 1.6" (1.565 m)  PainSc: 0-No pain   Body mass index is 36.09 kg/m.  Wt Readings from Last 3 Encounters:  04/02/22 194 lb 12.8 oz (88.4 kg)  12/05/21 193 lb 4.8 oz (87.7 kg)  11/13/21 192 lb 4 oz (87.2 kg)    Objective:  Physical Exam Vitals and nursing note reviewed.  Constitutional:      Appearance: Normal appearance.  HENT:     Head: Normocephalic and atraumatic.     Right Ear: Tympanic membrane, ear canal and external ear normal.     Left Ear: Tympanic  membrane, ear canal and external ear normal.     Nose:     Comments: Masked     Mouth/Throat:     Comments: Masked  Eyes:     Extraocular Movements: Extraocular movements intact.     Conjunctiva/sclera: Conjunctivae normal.     Pupils: Pupils are equal, round, and reactive to light.  Cardiovascular:     Rate and Rhythm: Normal rate and regular rhythm.     Pulses: Normal pulses.     Heart sounds: Normal heart sounds.  Pulmonary:     Effort: Pulmonary effort is normal.     Breath sounds: Normal breath sounds.  Chest:  Breasts:    Tanner Score is 5.  Right: Normal.     Left: Normal.  Abdominal:     General: Abdomen is flat. Bowel sounds are normal.     Palpations: Abdomen is soft.  Genitourinary:    Comments: deferred Musculoskeletal:        General: Normal range of motion.     Cervical back: Normal range of motion and neck supple.  Skin:    General: Skin is warm and dry.  Neurological:     General: No focal deficit present.     Mental Status: She is alert and oriented to person, place, and time.  Psychiatric:        Mood and Affect: Mood normal.        Behavior: Behavior normal.      Assessment And Plan:     1. Encounter for annual physical exam Comments: A full exam was performed. Importance of monthly self breast exams was discussed with the patient.  PATIENT IS ADVISED TO GET 30-45 MINUTES REGULAR EXERCISE NO LESS THAN FOUR TO FIVE DAYS PER WEEK - BOTH WEIGHTBEARING EXERCISES AND AEROBIC ARE RECOMMENDED.  PATIENT IS ADVISED TO FOLLOW A HEALTHY DIET WITH AT LEAST SIX FRUITS/VEGGIES PER DAY, DECREASE INTAKE OF RED MEAT, AND TO INCREASE FISH INTAKE TO TWO DAYS PER WEEK.  MEATS/FISH SHOULD NOT BE FRIED, BAKED OR BROILED IS PREFERABLE.  IT IS ALSO IMPORTANT TO CUT BACK ON YOUR SUGAR INTAKE. PLEASE AVOID ANYTHING WITH ADDED SUGAR, CORN SYRUP OR OTHER SWEETENERS. IF YOU MUST USE A SWEETENER, YOU CAN TRY STEVIA. IT IS ALSO IMPORTANT TO AVOID ARTIFICIALLY SWEETENERS AND DIET  BEVERAGES. LASTLY, I SUGGEST WEARING SPF 50 SUNSCREEN ON EXPOSED PARTS AND ESPECIALLY WHEN IN THE DIRECT SUNLIGHT FOR AN EXTENDED PERIOD OF TIME.  PLEASE AVOID FAST FOOD RESTAURANTS AND INCREASE YOUR WATER INTAKE.  2. Hypertensive nephropathy Comments: Chronic, well controlled.  EKG performed, NSR w/o acute changes. She is advised to follow low sodium diet. She will f/u in 6 months. She will c/w valsartan/hct. - POCT Urinalysis Dipstick (81002) - Microalbumin / creatinine urine ratio - EKG 12-Lead - CMP14+EGFR - CBC - Lipid panel  3. Stage 3a chronic kidney disease (HCC) Comments: Chronic, she is reminded to avoid NSAIDs, stay well hydrated and keep BP well controlled to decrease risk of CKD progression. - POCT Urinalysis Dipstick (81002) - Microalbumin / creatinine urine ratio - EKG 12-Lead  4. Other abnormal glucose Comments: Her a1c has been elevated in the past. I will recheck this today. She is encouraged to decrease her intake of sugary foods/beverages. - Hemoglobin A1c  5. Estrogen deficiency Comments: I will refer her to [facility] for bone density. Has h/o osteoporosis. Previously treated with Prolia, hasn't requested refill from pharmacy. Agrees to Rheum eval. - Vitamin D (25 hydroxy) - DG Bone Density; Future  6. Class 2 severe obesity due to excess calories with serious comorbidity and body mass index (BMI) of 36.0 to 36.9 in adult [facility]) Comments: She is encouraged to aim for at least 150 minutes of exercise per week, while striving for BMI<30 to decrease cardiac risk.  7. Immunization due Comments: She was given high dose flu. She was also given Boostrix/Tdap, billed via [employer]. - Flu Vaccine QUAD High Dose(Fluad) - Tdap vaccine greater than or equal to 7yo IM  Patient was given opportunity to ask questions. Patient verbalized understanding of the plan and was able to repeat key elements of the plan. All questions were answered to their satisfaction.   I, Maximino Greenland, MD,  have reviewed all documentation for this visit. The documentation on 04/02/22 for the exam, diagnosis, procedures, and orders are all accurate and complete.   THE PATIENT IS ENCOURAGED TO PRACTICE SOCIAL DISTANCING DUE TO THE COVID-19 PANDEMIC.

## 2022-04-03 LAB — CMP14+EGFR
ALT: 11 IU/L (ref 0–32)
AST: 19 IU/L (ref 0–40)
Albumin/Globulin Ratio: 1.5 (ref 1.2–2.2)
Albumin: 4.4 g/dL (ref 3.9–4.9)
Alkaline Phosphatase: 58 IU/L (ref 44–121)
BUN/Creatinine Ratio: 16 (ref 12–28)
BUN: 19 mg/dL (ref 8–27)
Bilirubin Total: 0.8 mg/dL (ref 0.0–1.2)
CO2: 25 mmol/L (ref 20–29)
Calcium: 10.1 mg/dL (ref 8.7–10.3)
Chloride: 105 mmol/L (ref 96–106)
Creatinine, Ser: 1.2 mg/dL — ABNORMAL HIGH (ref 0.57–1.00)
Globulin, Total: 2.9 g/dL (ref 1.5–4.5)
Glucose: 75 mg/dL (ref 70–99)
Potassium: 4.1 mmol/L (ref 3.5–5.2)
Sodium: 143 mmol/L (ref 134–144)
Total Protein: 7.3 g/dL (ref 6.0–8.5)
eGFR: 49 mL/min/{1.73_m2} — ABNORMAL LOW (ref >59)

## 2022-04-03 LAB — LIPID PANEL
Chol/HDL Ratio: 3.6 ratio (ref 0.0–4.4)
Cholesterol, Total: 182 mg/dL (ref 100–199)
HDL: 51 mg/dL (ref >39)
LDL Chol Calc (NIH): 117 mg/dL — ABNORMAL HIGH (ref 0–99)
Triglycerides: 78 mg/dL (ref 0–149)
VLDL Cholesterol Cal: 14 mg/dL (ref 5–40)

## 2022-04-03 LAB — CBC
Hematocrit: 45.5 % (ref 34.0–46.6)
Hemoglobin: 15.3 g/dL (ref 11.1–15.9)
MCH: 32.6 pg (ref 26.6–33.0)
MCHC: 33.6 g/dL (ref 31.5–35.7)
MCV: 97 fL (ref 79–97)
Platelets: 242 10*3/uL (ref 150–450)
RBC: 4.7 x10E6/uL (ref 3.77–5.28)
RDW: 12.1 % (ref 11.7–15.4)
WBC: 6.5 10*3/uL (ref 3.4–10.8)

## 2022-04-03 LAB — VITAMIN D 25 HYDROXY (VIT D DEFICIENCY, FRACTURES): Vit D, 25-Hydroxy: 57 ng/mL (ref 30.0–100.0)

## 2022-04-03 LAB — HEMOGLOBIN A1C
Est. average glucose Bld gHb Est-mCnc: 126 mg/dL
Hgb A1c MFr Bld: 6 % — ABNORMAL HIGH (ref 4.8–5.6)

## 2022-04-04 LAB — MICROALBUMIN / CREATININE URINE RATIO
Creatinine, Urine: 172.1 mg/dL
Microalb/Creat Ratio: 11 mg/g creat (ref 0–29)
Microalbumin, Urine: 19 ug/mL

## 2022-04-10 ENCOUNTER — Other Ambulatory Visit: Payer: Self-pay | Admitting: Internal Medicine

## 2022-04-12 ENCOUNTER — Other Ambulatory Visit: Payer: Self-pay | Admitting: Internal Medicine

## 2022-04-23 ENCOUNTER — Ambulatory Visit: Payer: Medicare HMO

## 2022-04-23 LAB — HM DEXA SCAN

## 2022-05-06 ENCOUNTER — Ambulatory Visit (INDEPENDENT_AMBULATORY_CARE_PROVIDER_SITE_OTHER): Payer: Medicare HMO

## 2022-05-06 VITALS — Ht 63.75 in | Wt 190.0 lb

## 2022-05-06 DIAGNOSIS — Z0000 Encounter for general adult medical examination without abnormal findings: Secondary | ICD-10-CM | POA: Diagnosis not present

## 2022-05-06 NOTE — Progress Notes (Signed)
I connected with Dalbert Garnet today by telephone and verified that I am speaking with the correct person using two identifiers. Location patient: home Location provider: work Persons participating in the virtual visit: Talibah, Colasurdo LPN.   I discussed the limitations, risks, security and privacy concerns of performing an evaluation and management service by telephone and the availability of in person appointments. I also discussed with the patient that there may be a patient responsible charge related to this service. The patient expressed understanding and verbally consented to this telephonic visit.    Interactive audio and video telecommunications were attempted between this provider and patient, however failed, due to patient having technical difficulties OR patient did not have access to video capability.  We continued and completed visit with audio only.     Vital signs may be patient reported or missing.  Subjective:   Rachel [name] is a 72 y.o. female who presents for Medicare Annual (Subsequent) preventive examination.  Review of Systems     Cardiac Risk Factors include: advanced age (>17mn, >>17women);hypertension;obesity (BMI >30kg/m2)     Objective:    Today's Vitals   05/06/22 1551  Weight: 190 lb (86.2 kg)  Height: 5' 3.75" (1.619 m)   Body mass index is 32.87 kg/m.     05/06/2022    3:56 PM 12/05/2021   12:00 PM 04/09/2021    2:09 PM 06/21/2020    7:28 AM 06/13/2020    4:37 PM 03/06/2020   11:06 AM 02/20/2020   10:59 AM  Advanced Directives  Does Patient Have a Medical Advance Directive? No No No No No No No  Does patient want to make changes to medical advance directive?     No - Patient declined    Would patient like information on creating a medical advance directive?  Yes (Inpatient - patient defers creating a medical advance directive at this time - Information given)  No - Patient declined No - Patient declined No - Patient declined  No - Patient declined    Current Medications (verified) Outpatient Encounter Medications as of 05/06/2022  Medication Sig   calcium carbonate (TUMS - DOSED IN MG ELEMENTAL CALCIUM) 500 MG chewable tablet Chew 4 tablets by mouth 2 (two) times daily as needed for indigestion or heartburn.   cholecalciferol (VITAMIN D3) 25 MCG (1000 UNIT) tablet Take 1,000 Units by mouth daily.   COMBIGAN 0.2-0.5 % ophthalmic solution Place 1 drop into the right eye 2 (two) times daily.   denosumab (PROLIA) 60 MG/ML SOSY injection TO BE ADMINISTERED IN PHYSICIAN'S OFFICE. INJECT ONE SYRINGE SUBCUTANEOUSLY ONCE EVERY 6 MONTHS. REFRIGERATE. USE WITHIN 14 DAYS ONCE AT ROOM TEMPERATURE.   famotidine (PEPCID) 40 MG tablet Take 40 mg by mouth 2 (two) times daily.   Ferrous Gluconate (IRON 27 PO) Take 1 tablet by mouth at bedtime.   levothyroxine (SYNTHROID) 100 MCG tablet Take 1 tablet (100 mcg total) by mouth daily before breakfast. (Patient taking differently: Take 100 mcg by mouth daily before breakfast.)   Magnesium 250 MG TABS Take 250 mg by mouth at bedtime.   simvastatin (ZOCOR) 40 MG tablet Take 1 tablet (40 mg total) by mouth at bedtime.   solifenacin (VESICARE) 5 MG tablet Take 5 mg by mouth at bedtime.   temazepam (RESTORIL) 15 MG capsule TAKE 1 CAPSULE BY MOUTH EVERY DAY AT BEDTIME AS NEEDED (Patient taking differently: Take 15 mg by mouth at bedtime as needed for sleep. TAKE 1 CAPSULE BY MOUTH EVERY DAY  AT BEDTIME AS NEEDED)   valsartan-hydrochlorothiazide (DIOVAN-HCT) 80-12.5 MG tablet TAKE 1 TABLET BY MOUTH EVERY DAY (Patient taking differently: Take 1 tablet by mouth daily.)   No facility-administered encounter medications on file as of 05/06/2022.    Allergies (verified) Dilantin [phenytoin sodium extended]   History: Past Medical History:  Diagnosis Date   CKD (chronic kidney disease), stage III [facility])    nephrologist--- dr l. foster   GERD (gastroesophageal reflux disease)    Glaucoma, right eye     Hiatal hernia    History of gastric ulcer    per egd 2019 non-bleeding   History of spontaneous subarachnoid intracranial hemorrhage due to cerebral aneurysm 03/2004   due to A-com region bilobed aneursym  s/p craniotomy with clipping;  per pt no residual and no issues since   Hypertension    followed by pcp   Hypertensive nephropathy    Hypothyroidism    endocrinologist--- dr Kelton Pillar   IDA (iron deficiency anemia)    Insomnia    Mastodynia of left breast    OAB (overactive bladder)    Osteoporosis    Renal calculus, left    Wears glasses    Wears partial dentures    lower   Past Surgical History:  Procedure Laterality Date   CEREBRAL ANEURYSM REPAIR  03/2004   '@[facility]'$  by dr Loretha Stapler   ;   craniotomy w/ clipping to A-com region for bilobed aneurysm (right skull base)   CLOSED REDUCTION NASAL FRACTURE N/A 06/21/2020   Procedure: CLOSED REDUCTION NASAL FRACTURE;  Surgeon: Jason Coop, DO;  Location: [place];  Service: ENT;  Laterality: N/A;   COLONOSCOPY WITH ESOPHAGOGASTRODUODENOSCOPY (EGD)  03/16/2018   CYSTOSCOPY W/ URETERAL STENT PLACEMENT Left 11/13/2021   Procedure: CYSTOSCOPY WITH RETROGRADE PYELOGRAM/URETERAL STENT PLACEMENT;  Surgeon: Janith Lima, MD;  Location: [facility];  Service: Urology;  Laterality: Left;   CYSTOSCOPY/URETEROSCOPY/HOLMIUM LASER/STENT PLACEMENT Left 12/05/2021   Procedure: CYSTOSCOPY/ RETROGRADE/URETEROSCOPY/HOLMIUM LASER/STENT PLACEMENT;  Surgeon: Janith Lima, MD;  Location: [facility];  Service: Urology;  Laterality: Left;   LAPAROSCOPIC BILATERAL SALPINGO OOPHERECTOMY Bilateral 11/12/2015   Procedure: LAPAROSCOPIC BILATERAL SALPINGO OOPHORECTOMY;  Surgeon: Everett Graff, MD;  Location: [facility];  Service: Gynecology;  Laterality: Bilateral;   LAPAROSCOPY N/A 11/22/2015   Procedure: LAPAROSCOPY DIAGNOSTIC with repair of serosal tear;  Surgeon: Everett Graff, MD;  Location: [facility];  Service: Gynecology;  Laterality: N/A;    RADIOACTIVE SEED GUIDED EXCISIONAL BREAST BIOPSY Left 09/26/2015   Procedure: LEFT RADIOACTIVE SEED GUIDED EXCISIONAL BREAST BIOPSY;  Surgeon: Stark Klein, MD;  Location: [place];  Service: General;  Laterality: Left;   RADIOACTIVE SEED GUIDED EXCISIONAL BREAST BIOPSY Left 02/20/2020   Procedure: LEFT RADIOACTIVE SEED GUIDED EXCISIONAL BREAST BIOPSY;  Surgeon: Stark Klein, MD;  Location: [place];  Service: General;  Laterality: Left;  RNFA   WISDOM TOOTH EXTRACTION     Family History  Problem Relation Age of Onset   Cancer Mother        Breast    Hypertension Mother    Cancer Father        Prostate   Hypertension Father    Cancer Sister        Breast   Social History   Socioeconomic History   Marital status: Married    Spouse name: Not on file   Number of children: Not on file   Years of education: Not on file   Highest education level: Not on file  Occupational  History   Occupation: retired  Tobacco Use   Smoking status: Former    Packs/day: 1.00    Years: 30.00    Total pack years: 30.00    Types: Cigarettes    Start date: 29    Quit date: 04/02/2004    Years since quitting: 18.1   Smokeless tobacco: Never  Vaping Use   Vaping Use: Never used  Substance and Sexual Activity   Alcohol use: No   Drug use: Never   Sexual activity: Not on file  Other Topics Concern   Not on file  Social History Narrative   Not on file   Social Determinants of Health   Financial Resource Strain: Low Risk  (05/06/2022)   Overall Financial Resource Strain (CARDIA)    Difficulty of Paying Living Expenses: Not hard at all  Food Insecurity: No Food Insecurity (05/06/2022)   Hunger Vital Sign    Worried About Running Out of Food in the Last Year: Never true    Ran Out of Food in the Last Year: Never true  Transportation Needs: No Transportation Needs (05/06/2022)   PRAPARE - Hydrologist (Medical): No    Lack of Transportation  (Non-Medical): No  Physical Activity: Sufficiently Active (05/06/2022)   Exercise Vital Sign    Days of Exercise per Week: 5 days    Minutes of Exercise per Session: 30 min  Stress: No Stress Concern Present (05/06/2022)   [place]    Feeling of Stress : Not at all  Social Connections: Not on file    Tobacco Counseling Counseling given: Not Answered   Clinical Intake:  Pre-visit preparation completed: Yes  Pain : No/denies pain     Nutritional Status: BMI > 30  Obese Nutritional Risks: None Diabetes: No  How often do you need to have someone help you when you read instructions, pamphlets, or other written materials from your doctor or pharmacy?: 1 - Never  Diabetic? no  Interpreter Needed?: No  Information entered by :: NAllen LPN   Activities of Daily Living    05/06/2022    3:56 PM 12/05/2021   12:07 PM  In your present state of health, do you have any difficulty performing the following activities:  Hearing? 0 0  Vision? 0 0  Difficulty concentrating or making decisions? 0 0  Walking or climbing stairs? 0 0  Dressing or bathing? 0 0  Doing errands, shopping? 0   Preparing Food and eating ? N   Using the Toilet? N   In the past six months, have you accidently leaked urine? N   Do you have problems with loss of bowel control? N   Managing your Medications? N   Managing your Finances? N   Housekeeping or managing your Housekeeping? N     Patient Care Team: Glendale Chard, MD as PCP - General (Internal Medicine)  Indicate any recent Medical Services you may have received from other than [facility] providers in the past year (date may be approximate).     Assessment:   This is a routine wellness examination for Liza.  Hearing/Vision screen Vision Screening - Comments:: Regular eye exams, [facility]  Dietary issues and exercise activities discussed: Current Exercise Habits: Home  exercise routine, Type of exercise: treadmill, Time (Minutes): 30, Frequency (Times/Week): 5, Weekly Exercise (Minutes/Week): 150   Goals Addressed             This Visit's Progress  Patient Stated       05/06/2022, wants to lose weight       Depression Screen    05/06/2022    3:56 PM 04/09/2021    2:12 PM 03/26/2021   10:59 AM 03/06/2020   11:07 AM 03/01/2019   11:59 AM 08/31/2018   11:34 AM 02/25/2018   11:08 AM  PHQ 2/9 Scores  PHQ - 2 Score 0 0 0 0 0 0 0  PHQ- 9 Score   0        Fall Risk    05/06/2022    3:56 PM 04/09/2021    2:10 PM 08/20/2020   10:34 AM 03/06/2020   11:07 AM 03/01/2019   11:59 AM  Fall Risk   Falls in the past year? 0 1 1 0 0  Comment  fell on uneven pavement     Number falls in past yr: 0 0 0  0  Comment   tripped on uneven pavement    Injury with Fall? 0 1 1    Comment  broke nose broke her nose    Risk for fall due to : Medication side effect Medication side effect  Medication side effect Medication side effect  Follow up Falls prevention discussed;Education provided;Falls evaluation completed Falls evaluation completed;Education provided;Falls prevention discussed  Falls evaluation completed;Education provided;Falls prevention discussed Falls evaluation completed;Education provided;Falls prevention discussed    FALL RISK PREVENTION PERTAINING TO THE HOME:  Any stairs in or around the home? Yes  If so, are there any without handrails? Yes  Home free of loose throw rugs in walkways, pet beds, electrical cords, etc? Yes  Adequate lighting in your home to reduce risk of falls? Yes   ASSISTIVE DEVICES UTILIZED TO PREVENT FALLS:  Life alert? No  Use of a cane, walker or w/c? No  Grab bars in the bathroom? No  Shower chair or bench in shower? No  Elevated toilet seat or a handicapped toilet? Yes   TIMED UP AND GO:  Was the test performed? No .       Cognitive Function:        05/06/2022    3:57 PM 04/09/2021    2:13 PM  03/06/2020   11:08 AM 03/01/2019   12:00 PM  6CIT Screen  What Year? 0 points 0 points 0 points 0 points  What month? 0 points 0 points 0 points 0 points  What time? 3 points 0 points 0 points 0 points  Count back from 20 0 points 0 points 0 points 0 points  Months in reverse 2 points 0 points 2 points 0 points  Repeat phrase 0 points 0 points 2 points 4 points  Total Score 5 points 0 points 4 points 4 points    Immunizations Immunization History  Administered Date(s) Administered   Fluad Quad(high Dose 65+) 03/06/2020, 03/26/2021, 04/02/2022   Influenza, High Dose Seasonal PF 02/25/2018, 03/01/2019   Influenza-Unspecified 01/25/2017   PFIZER Comirnaty(Gray Top)Covid-19 Tri-Sucrose Vaccine 10/02/2020   PFIZER(Purple Top)SARS-COV-2 Vaccination 06/03/2019, 06/28/2019, 01/30/2020, 10/02/2020, 09/01/2021   Pneumococcal Conjugate-13 03/22/2013   Pneumococcal Polysaccharide-23 03/12/2020   Tdap 04/02/2022   Zoster Recombinat (Shingrix) 09/01/2021    TDAP status: Up to date  Flu Vaccine status: Up to date  Pneumococcal vaccine status: Up to date  Covid-19 vaccine status: Completed vaccines  Qualifies for Shingles Vaccine? Yes   Zostavax completed Yes   Shingrix Completed?: needs second dose  Screening Tests Health Maintenance  Topic Date Due   Zoster Vaccines- Shingrix (  2 of 2) 10/27/2021   COVID-19 Vaccine (7 - 2023-24 season) 12/26/2021   PAP SMEAR-Modifier  01/23/2022   Medicare Annual Wellness (AWV)  04/09/2022   MAMMOGRAM  03/13/2023   COLONOSCOPY (Pts 45-70yr Insurance coverage will need to be confirmed)  03/16/2028   DTaP/Tdap/Td (2 - Td or Tdap) 04/02/2032   Pneumonia Vaccine 71 Years old  Completed   INFLUENZA VACCINE  Completed   DEXA SCAN  Completed   Hepatitis C Screening  Completed   HPV VACCINES  Aged Out    Health Maintenance  Health Maintenance Due  Topic Date Due   Zoster Vaccines- Shingrix (2 of 2) 10/27/2021   COVID-19 Vaccine (7 - 2023-24  season) 12/26/2021   PAP SMEAR-Modifier  01/23/2022   Medicare Annual Wellness (AWV)  04/09/2022    Colorectal cancer screening: Type of screening: Colonoscopy. Completed 03/16/2018. Repeat every 10 years  Mammogram status: Completed 03/12/2022. Repeat every year  Bone Density status: Completed 12/13/2018.   Lung Cancer Screening: (Low Dose CT Chest recommended if Age 71-80years, 30 pack-year currently smoking OR have quit w/in 15years.) does not qualify.   Lung Cancer Screening Referral: no  Additional Screening:  Hepatitis C Screening: does qualify; Completed 02/25/2018  Vision Screening: Recommended annual ophthalmology exams for early detection of glaucoma and other disorders of the eye. Is the patient up to date with their annual eye exam?  Yes  Who is the provider or what is the name of the office in which the patient attends annual eye exams? [facility] If pt is not established with a provider, would they like to be referred to a provider to establish care? No .   Dental Screening: Recommended annual dental exams for proper oral hygiene  Community Resource Referral / Chronic Care Management: CRR required this visit?  No   CCM required this visit?  No      Plan:     I have personally reviewed and noted the following in the patient's chart:   Medical and social history Use of alcohol, tobacco or illicit drugs  Current medications and supplements including opioid prescriptions. Patient is not currently taking opioid prescriptions. Functional ability and status Nutritional status Physical activity Advanced directives List of other physicians Hospitalizations, surgeries, and ER visits in previous 12 months Vitals Screenings to include cognitive, depression, and falls Referrals and appointments  In addition, I have reviewed and discussed with patient certain preventive protocols, quality metrics, and best practice recommendations. A written personalized care  plan for preventive services as well as general preventive health recommendations were provided to patient.     NKellie Simmering LPN   [id number]  Nurse Notes: none  Due to this being a virtual visit, the after visit summary with patients personalized plan was offered to patient via mail or my-chart.  Patient would like to access on my-chart

## 2022-05-06 NOTE — Patient Instructions (Signed)
Rachel [name] , Thank you for taking time to come for your Medicare Wellness Visit. I appreciate your ongoing commitment to your health goals. Please review the following plan we discussed and let me know if I can assist you in the future.   These are the goals we discussed:  Goals      Patient Stated     03/06/2020, no goals     Patient Stated     04/09/2021, continue to lose weight     Patient Stated     05/06/2022, wants to lose weight     Weight (lb) < 200 lb (90.7 kg)     03/01/2019, lose weight to feel better and get off some medications        This is a list of the screening recommended for you and due dates:  Health Maintenance  Topic Date Due   Zoster (Shingles) Vaccine (2 of 2) 10/27/2021   COVID-19 Vaccine (7 - 2023-24 season) 12/26/2021   Pap Smear  01/23/2022   Mammogram  03/13/2023   Medicare Annual Wellness Visit  05/07/2023   Colon Cancer Screening  03/16/2028   DTaP/Tdap/Td vaccine (2 - Td or Tdap) 04/02/2032   Pneumonia Vaccine  Completed   Flu Shot  Completed   DEXA scan (bone density measurement)  Completed   Hepatitis C Screening: USPSTF Recommendation to screen - Ages 42-79 yo.  Completed   HPV Vaccine  Aged Out    Advanced directives: Advance directive discussed with you today.   Conditions/risks identified: none  Next appointment: Follow up in one year for your annual wellness visit    Preventive Care 65 Years and Older, Female Preventive care refers to lifestyle choices and visits with your health care provider that can promote health and wellness. What does preventive care include? A yearly physical exam. This is also called an annual well check. Dental exams once or twice a year. Routine eye exams. Ask your health care provider how often you should have your eyes checked. Personal lifestyle choices, including: Daily care of your teeth and gums. Regular physical activity. Eating a healthy diet. Avoiding tobacco and drug use. Limiting  alcohol use. Practicing safe sex. Taking low-dose aspirin every day. Taking vitamin and mineral supplements as recommended by your health care provider. What happens during an annual well check? The services and screenings done by your health care provider during your annual well check will depend on your age, overall health, lifestyle risk factors, and family history of disease. Counseling  Your health care provider may ask you questions about your: Alcohol use. Tobacco use. Drug use. Emotional well-being. Home and relationship well-being. Sexual activity. Eating habits. History of falls. Memory and ability to understand (cognition). Work and work Statistician. Reproductive health. Screening  You may have the following tests or measurements: Height, weight, and BMI. Blood pressure. Lipid and cholesterol levels. These may be checked every 5 years, or more frequently if you are over 17 years old. Skin check. Lung cancer screening. You may have this screening every year starting at age 71 if you have a 30-pack-year history of smoking and currently smoke or have quit within the past 15 years. Fecal occult blood test (FOBT) of the stool. You may have this test every year starting at age 71. Flexible sigmoidoscopy or colonoscopy. You may have a sigmoidoscopy every 5 years or a colonoscopy every 10 years starting at age 71. Hepatitis C blood test. Hepatitis B blood test. Sexually transmitted disease (STD) testing. Diabetes screening.  This is done by checking your blood sugar (glucose) after you have not eaten for a while (fasting). You may have this done every 1-3 years. Bone density scan. This is done to screen for osteoporosis. You may have this done starting at age 71. Mammogram. This may be done every 1-2 years. Talk to your health care provider about how often you should have regular mammograms. Talk with your health care provider about your test results, treatment options, and if  necessary, the need for more tests. Vaccines  Your health care provider may recommend certain vaccines, such as: Influenza vaccine. This is recommended every year. Tetanus, diphtheria, and acellular pertussis (Tdap, Td) vaccine. You may need a Td booster every 10 years. Zoster vaccine. You may need this after age 71. Pneumococcal 13-valent conjugate (PCV13) vaccine. One dose is recommended after age 71. Pneumococcal polysaccharide (PPSV23) vaccine. One dose is recommended after age 71. Talk to your health care provider about which screenings and vaccines you need and how often you need them. This information is not intended to replace advice given to you by your health care provider. Make sure you discuss any questions you have with your health care provider. Document Released: 05/10/2015 Document Revised: 01/01/2016 Document Reviewed: 02/12/2015 Elsevier Interactive Patient Education  2017 [employer] Prevention in the Home Falls can cause injuries. They can happen to people of all ages. There are many things you can do to make your home safe and to help prevent falls. What can I do on the outside of my home? Regularly fix the edges of walkways and driveways and fix any cracks. Remove anything that might make you trip as you walk through a door, such as a raised step or threshold. Trim any bushes or trees on the path to your home. Use bright outdoor lighting. Clear any walking paths of anything that might make someone trip, such as rocks or tools. Regularly check to see if handrails are loose or broken. Make sure that both sides of any steps have handrails. Any raised decks and porches should have guardrails on the edges. Have any leaves, snow, or ice cleared regularly. Use sand or salt on walking paths during winter. Clean up any spills in your garage right away. This includes oil or grease spills. What can I do in the bathroom? Use night lights. Install grab bars by the toilet  and in the tub and shower. Do not use towel bars as grab bars. Use non-skid mats or decals in the tub or shower. If you need to sit down in the shower, use a plastic, non-slip stool. Keep the floor dry. Clean up any water that spills on the floor as soon as it happens. Remove soap buildup in the tub or shower regularly. Attach bath mats securely with double-sided non-slip rug tape. Do not have throw rugs and other things on the floor that can make you trip. What can I do in the bedroom? Use night lights. Make sure that you have a light by your bed that is easy to reach. Do not use any sheets or blankets that are too big for your bed. They should not hang down onto the floor. Have a firm chair that has side arms. You can use this for support while you get dressed. Do not have throw rugs and other things on the floor that can make you trip. What can I do in the kitchen? Clean up any spills right away. Avoid walking on wet floors. Keep items  that you use a lot in easy-to-reach places. If you need to reach something above you, use a strong step stool that has a grab bar. Keep electrical cords out of the way. Do not use floor polish or wax that makes floors slippery. If you must use wax, use non-skid floor wax. Do not have throw rugs and other things on the floor that can make you trip. What can I do with my stairs? Do not leave any items on the stairs. Make sure that there are handrails on both sides of the stairs and use them. Fix handrails that are broken or loose. Make sure that handrails are as long as the stairways. Check any carpeting to make sure that it is firmly attached to the stairs. Fix any carpet that is loose or worn. Avoid having throw rugs at the top or bottom of the stairs. If you do have throw rugs, attach them to the floor with carpet tape. Make sure that you have a light switch at the top of the stairs and the bottom of the stairs. If you do not have them, ask someone to add  them for you. What else can I do to help prevent falls? Wear shoes that: Do not have high heels. Have rubber bottoms. Are comfortable and fit you well. Are closed at the toe. Do not wear sandals. If you use a stepladder: Make sure that it is fully opened. Do not climb a closed stepladder. Make sure that both sides of the stepladder are locked into place. Ask someone to hold it for you, if possible. Clearly mark and make sure that you can see: Any grab bars or handrails. First and last steps. Where the edge of each step is. Use tools that help you move around (mobility aids) if they are needed. These include: Canes. Walkers. Scooters. Crutches. Turn on the lights when you go into a dark area. Replace any light bulbs as soon as they burn out. Set up your furniture so you have a clear path. Avoid moving your furniture around. If any of your floors are uneven, fix them. If there are any pets around you, be aware of where they are. Review your medicines with your doctor. Some medicines can make you feel dizzy. This can increase your chance of falling. Ask your doctor what other things that you can do to help prevent falls. This information is not intended to replace advice given to you by your health care provider. Make sure you discuss any questions you have with your health care provider. Document Released: 02/07/2009 Document Revised: 09/19/2015 Document Reviewed: 05/18/2014 Elsevier Interactive Patient Education  2017 [employer].

## 2022-05-22 ENCOUNTER — Encounter: Payer: Self-pay | Admitting: Internal Medicine

## 2022-05-26 ENCOUNTER — Ambulatory Visit (INDEPENDENT_AMBULATORY_CARE_PROVIDER_SITE_OTHER): Payer: Medicare HMO

## 2022-05-26 VITALS — BP 108/82 | HR 77 | Temp 98.1°F | Ht 63.0 in | Wt 190.0 lb

## 2022-05-26 DIAGNOSIS — M8580 Other specified disorders of bone density and structure, unspecified site: Secondary | ICD-10-CM | POA: Diagnosis not present

## 2022-05-26 MED ORDER — DENOSUMAB 60 MG/ML ~~LOC~~ SOSY
60.0000 mg | PREFILLED_SYRINGE | Freq: Once | SUBCUTANEOUS | Status: AC
  Administered 2022-05-26: 60 mg via SUBCUTANEOUS

## 2022-05-26 NOTE — Progress Notes (Signed)
Pt presents today for Prolia injection.

## 2022-05-28 ENCOUNTER — Other Ambulatory Visit: Payer: Self-pay | Admitting: Internal Medicine

## 2022-05-28 DIAGNOSIS — I10 Essential (primary) hypertension: Secondary | ICD-10-CM

## 2022-07-01 ENCOUNTER — Other Ambulatory Visit: Payer: Self-pay | Admitting: Internal Medicine

## 2022-07-01 ENCOUNTER — Ambulatory Visit (INDEPENDENT_AMBULATORY_CARE_PROVIDER_SITE_OTHER): Payer: Medicare HMO | Admitting: Internal Medicine

## 2022-07-01 ENCOUNTER — Encounter: Payer: Self-pay | Admitting: Internal Medicine

## 2022-07-01 VITALS — BP 124/80 | HR 66 | Ht 63.0 in | Wt 196.0 lb

## 2022-07-01 DIAGNOSIS — E039 Hypothyroidism, unspecified: Secondary | ICD-10-CM | POA: Diagnosis not present

## 2022-07-01 DIAGNOSIS — I10 Essential (primary) hypertension: Secondary | ICD-10-CM

## 2022-07-01 LAB — TSH: TSH: 0.76 u[IU]/mL (ref 0.35–5.50)

## 2022-07-01 NOTE — Progress Notes (Signed)
Name: Rachel [name]  MRN/ DOB: [id number], 1950/06/21    Age/ Sex: 72 y.o., female     PCP: Glendale Chard, MD   Reason for Endocrinology Evaluation: Hypothyroidism     Initial Endocrinology Clinic Visit: 08/16/2020    PATIENT IDENTIFIER: Rachel [name] is a 72 y.o., female with a past medical history of HTN and pulmonary fibrosis . She has followed with [facility] since 08/16/2020 for consultative assistance with management of her Hypothyroidism.   HISTORICAL SUMMARY:   She has been diagnosed with hypothyroidism years ago. Has been on Lt-4 ever since.    Sister with thyroid disease  SUBJECTIVE:     Today (07/01/2022):  Rachel [name] is here for hypothyroidism.  Pt has been noted with weight gain  Denies tremors or palpitations  Denies constipation or diarrhea  Denies local neck symptoms   Uses a pill box for synthroid and takes it appropriately  No biotin intake  Energy is stable  Synthroid 100 mcg daily     HISTORY:  Past Medical History:  Past Medical History:  Diagnosis Date   CKD (chronic kidney disease), stage III [facility])    nephrologist--- dr l. foster   GERD (gastroesophageal reflux disease)    Glaucoma, right eye    Hiatal hernia    History of gastric ulcer    per egd 2019 non-bleeding   History of spontaneous subarachnoid intracranial hemorrhage due to cerebral aneurysm 03/2004   due to A-com region bilobed aneursym  s/p craniotomy with clipping;  per pt no residual and no issues since   Hypertension    followed by pcp   Hypertensive nephropathy    Hypothyroidism    endocrinologist--- dr Kelton Pillar   IDA (iron deficiency anemia)    Insomnia    Mastodynia of left breast    OAB (overactive bladder)    Osteoporosis    Renal calculus, left    Wears glasses    Wears partial dentures    lower   Past Surgical History:  Past Surgical History:  Procedure Laterality Date   CEREBRAL ANEURYSM REPAIR  03/2004   '@[facility]'$  by dr Loretha Stapler    ;   craniotomy w/ clipping to A-com region for bilobed aneurysm (right skull base)   CLOSED REDUCTION NASAL FRACTURE N/A 06/21/2020   Procedure: CLOSED REDUCTION NASAL FRACTURE;  Surgeon: Jason Coop, DO;  Location: [facility];  Service: ENT;  Laterality: N/A;   COLONOSCOPY WITH ESOPHAGOGASTRODUODENOSCOPY (EGD)  03/16/2018   CYSTOSCOPY W/ URETERAL STENT PLACEMENT Left 11/13/2021   Procedure: CYSTOSCOPY WITH RETROGRADE PYELOGRAM/URETERAL STENT PLACEMENT;  Surgeon: Janith Lima, MD;  Location: [facility];  Service: Urology;  Laterality: Left;   CYSTOSCOPY/URETEROSCOPY/HOLMIUM LASER/STENT PLACEMENT Left 12/05/2021   Procedure: CYSTOSCOPY/ RETROGRADE/URETEROSCOPY/HOLMIUM LASER/STENT PLACEMENT;  Surgeon: Janith Lima, MD;  Location: [facility];  Service: Urology;  Laterality: Left;   LAPAROSCOPIC BILATERAL SALPINGO OOPHERECTOMY Bilateral 11/12/2015   Procedure: LAPAROSCOPIC BILATERAL SALPINGO OOPHORECTOMY;  Surgeon: Everett Graff, MD;  Location: [facility];  Service: Gynecology;  Laterality: Bilateral;   LAPAROSCOPY N/A 11/22/2015   Procedure: LAPAROSCOPY DIAGNOSTIC with repair of serosal tear;  Surgeon: Everett Graff, MD;  Location: [facility];  Service: Gynecology;  Laterality: N/A;   RADIOACTIVE SEED GUIDED EXCISIONAL BREAST BIOPSY Left 09/26/2015   Procedure: LEFT RADIOACTIVE SEED GUIDED EXCISIONAL BREAST BIOPSY;  Surgeon: Stark Klein, MD;  Location: [facility];  Service: General;  Laterality: Left;   RADIOACTIVE SEED GUIDED EXCISIONAL BREAST BIOPSY Left 02/20/2020   Procedure:  LEFT RADIOACTIVE SEED GUIDED EXCISIONAL BREAST BIOPSY;  Surgeon: Stark Klein, MD;  Location: [facility];  Service: General;  Laterality: Left;  RNFA   WISDOM TOOTH EXTRACTION     Social History:  reports that she quit smoking about 18 years ago. Her smoking use included cigarettes. She started smoking about 51 years ago. She has a 30.00 pack-year smoking history. She has never used smokeless tobacco.  She reports that she does not drink alcohol and does not use drugs. Family History:  Family History  Problem Relation Age of Onset   Cancer Mother        Breast    Hypertension Mother    Cancer Father        Prostate   Hypertension Father    Cancer Sister        Breast     HOME MEDICATIONS: Allergies as of 07/01/2022       Reactions   Dilantin [phenytoin Sodium Extended] Rash        Medication List        Accurate as of July 01, 2022  1:35 PM. If you have any questions, ask your nurse or doctor.          calcium carbonate 500 MG chewable tablet Commonly known as: TUMS - dosed in mg elemental calcium Chew 4 tablets by mouth 2 (two) times daily as needed for indigestion or heartburn.   cholecalciferol 25 MCG (1000 UNIT) tablet Commonly known as: VITAMIN D3 Take 1,000 Units by mouth daily.   Combigan 0.2-0.5 % ophthalmic solution Generic drug: brimonidine-timolol Place 1 drop into the right eye 2 (two) times daily.   famotidine 40 MG tablet Commonly known as: PEPCID Take 40 mg by mouth 2 (two) times daily.   IRON 27 PO Take 1 tablet by mouth at bedtime.   levothyroxine 100 MCG tablet Commonly known as: Synthroid Take 1 tablet (100 mcg total) by mouth daily before breakfast.   Magnesium 250 MG Tabs Take 250 mg by mouth at bedtime.   Prolia 60 MG/ML Sosy injection Generic drug: denosumab TO BE ADMINISTERED IN PHYSICIAN'S OFFICE. INJECT ONE SYRINGE SUBCUTANEOUSLY ONCE EVERY 6 MONTHS. REFRIGERATE. USE WITHIN 14 DAYS ONCE AT ROOM TEMPERATURE.   simvastatin 40 MG tablet Commonly known as: ZOCOR Take 1 tablet (40 mg total) by mouth at bedtime.   solifenacin 5 MG tablet Commonly known as: VESICARE Take 5 mg by mouth at bedtime.   temazepam 15 MG capsule Commonly known as: RESTORIL TAKE 1 CAPSULE BY MOUTH EVERY DAY AT BEDTIME AS NEEDED What changed:  how much to take how to take this when to take this reasons to take this    valsartan-hydrochlorothiazide 80-12.5 MG tablet Commonly known as: DIOVAN-HCT TAKE 1 TABLET BY MOUTH EVERY DAY          OBJECTIVE:   PHYSICAL EXAM: VS: BP 124/80 (BP Location: Left Arm, Patient Position: Sitting, Cuff Size: Large)   Pulse 66   Ht '5\' 3"'$  (1.6 m)   Wt 196 lb (88.9 kg)   SpO2 94%   BMI 34.72 kg/m    EXAM: General: Pt appears well and is in NAD  Neck: General: Supple without adenopathy. Thyroid: Thyroid size normal.  No goiter or nodules appreciated.   Lungs: Clear with good BS bilat with no rales, rhonchi, or wheezes  Heart: Auscultation: RRR.  Abdomen: Normoactive bowel sounds, soft, nontender, without masses or organomegaly palpable  Extremities:  BL LE: No pretibial edema normal ROM and strength.  Mental Status: Judgment, insight: Intact Orientation: Oriented to time, place, and person Memory: Intact for recent and remote events Mood and affect: No depression, anxiety, or agitation     DATA REVIEWED:    Latest Reference Range & Units 07/01/22 13:47  TSH 0.35 - 5.50 uIU/mL 0.76    ASSESSMENT / PLAN / RECOMMENDATIONS:   Hypothyroidism:   - Pt is cliniclaly euthyroid - NO local neck symptoms  - Pt educated extensively on the correct way to take levothyroxine (first thing in the morning with water, 30 minutes before eating or taking other medications). - Pt encouraged to double dose the following day if she were to miss a dose given long half-life of levothyroxine. -TSH remains within normal range, no change    Medications    Synthroid 88 MCG daily   F/U in 1 yr  Signed electronically by: Mack Guise, MD  [facility]  [facility] [address] [place], [place] [address] Phone: [phone] FAX: [phone]      CC: Glendale Chard, [address] [place] [address] Phone: [phone]  Fax: [phone]   Return to Endocrinology clinic as below: Future  Appointments  Date Time Provider Stuart  10/05/2022  2:40 PM Glendale Chard, MD [facility] None  04/05/2023  3:00 PM Glendale Chard, MD [facility] None  05/26/2023  2:30 PM [facility] None  05/26/2023  3:00 PM Glendale Chard, MD [facility] None

## 2022-07-01 NOTE — Patient Instructions (Signed)

## 2022-07-02 MED ORDER — SYNTHROID 100 MCG PO TABS: 100.0000 ug | ORAL_TABLET | Freq: Every day | ORAL | 3 refills | Status: DC

## 2022-07-08 ENCOUNTER — Other Ambulatory Visit: Payer: Self-pay | Admitting: Internal Medicine

## 2022-07-08 DIAGNOSIS — E2839 Other primary ovarian failure: Secondary | ICD-10-CM

## 2022-07-08 DIAGNOSIS — M8580 Other specified disorders of bone density and structure, unspecified site: Secondary | ICD-10-CM

## 2022-07-27 NOTE — Progress Notes (Signed)
Office Visit Note  Patient: Rachel [name]             Date of Birth: [birth year]           MRN: [id number]             PCP: Dorothyann Peng, MD Referring: Dorothyann Peng, MD Visit Date: 08/10/2022 Occupation: @GUAROCC @  Subjective:  Discuss osteoporosis treatment options   History of Present Illness: Rachel [name] is a 72 y.o. female who presents today for new patient consultation as requested by her PCP Dr. Allyne Gee.  Patient has been receiving Prolia 60 mg injections every 6 months ordered by her PCP Dr. Allyne Gee.  She is unsure of when she was initially started on Prolia.  She has been taking a vitamin D supplement daily.  She denies any recent falls or fractures.  She has no history of vertebral or stress fractures.  She is under the care of endocrinology for management of hypothyroidism.  She takes Synthroid as prescribed.  She denies any history of seizures.  She denies any history of GERD.  She has no personal history of breast cancer or radiation therapy.  No personal or family history of bone cancer.    Activities of Daily Living:  Patient reports morning stiffness for 0 minutes.   Patient Denies nocturnal pain.  Difficulty dressing/grooming: Denies Difficulty climbing stairs: Denies Difficulty getting out of chair: Denies Difficulty using hands for taps, buttons, cutlery, and/or writing: Denies  Review of Systems  Constitutional:  Negative for fatigue.  HENT:  Positive for mouth dryness. Negative for mouth sores.   Eyes:  Negative for dryness.  Respiratory:  Negative for shortness of breath.   Cardiovascular:  Negative for chest pain and palpitations.  Gastrointestinal:  Negative for blood in stool, constipation and diarrhea.  Endocrine: Negative for increased urination.  Genitourinary:  Negative for involuntary urination.  Musculoskeletal:  Negative for joint pain, gait problem, joint pain, joint swelling, myalgias, muscle weakness, morning stiffness, muscle  tenderness and myalgias.  Skin:  Negative for color change, rash, hair loss and sensitivity to sunlight.  Allergic/Immunologic: Negative for susceptible to infections.  Neurological:  Negative for dizziness and headaches.  Hematological:  Negative for swollen glands.  Psychiatric/Behavioral:  Negative for depressed mood and sleep disturbance. The patient is not nervous/anxious.     PMFS History:  Patient Active Problem List   Diagnosis Date Noted   Primary insomnia 10/12/2020   Osteopenia 10/12/2020   Stage 3a chronic kidney disease 10/12/2020   Acquired hypothyroidism 08/16/2020   Class 2 severe obesity due to excess calories with serious comorbidity and body mass index (BMI) of 36.0 to 36.9 in adult 02/25/2018   Primary hypothyroidism 02/25/2018   Other abnormal glucose 02/25/2018   Hypertensive nephropathy 02/25/2018   Chronic renal disease, stage II 02/25/2018   Postinflammatory pulmonary fibrosis 11/09/2016   Morbid obesity due to excess calories 12/17/2015   Small bowel obstruction 11/19/2015   Nausea with vomiting 11/18/2015   S/P vaginal hysterectomy 11/12/2015   Essential hypertension 04/03/2013    Past Medical History:  Diagnosis Date   CKD (chronic kidney disease), stage III    nephrologist--- dr l. foster   GERD (gastroesophageal reflux disease)    Glaucoma, right eye    Hiatal hernia    History of gastric ulcer    per egd 2019 non-bleeding   History of spontaneous subarachnoid intracranial hemorrhage due to cerebral aneurysm 03/2004   due to A-com region bilobed aneursym  s/p  craniotomy with clipping;  per pt no residual and no issues since   Hypertension    followed by pcp   Hypertensive nephropathy    Hypothyroidism    endocrinologist--- dr Lonzo Cloud   IDA (iron deficiency anemia)    Insomnia    Mastodynia of left breast    OAB (overactive bladder)    Osteoporosis    Renal calculus, left    Wears glasses    Wears partial dentures    lower    Family  History  Problem Relation Age of Onset   Cancer Mother        Breast    Hypertension Mother    Cancer Father        Prostate   Hypertension Father    Cancer Sister        Breast   Prostate cancer Brother    Healthy Son    Past Surgical History:  Procedure Laterality Date   CEREBRAL ANEURYSM REPAIR  03/2004   @[facility]  by dr Cephus Slater   ;   craniotomy w/ clipping to A-com region for bilobed aneurysm (right skull base)   CLOSED REDUCTION NASAL FRACTURE N/A 06/21/2020   Procedure: CLOSED REDUCTION NASAL FRACTURE;  Surgeon: Laren Boom, DO;  Location: [facility];  Service: ENT;  Laterality: N/A;   COLONOSCOPY WITH ESOPHAGOGASTRODUODENOSCOPY (EGD)  03/16/2018   CYSTOSCOPY W/ URETERAL STENT PLACEMENT Left 11/13/2021   Procedure: CYSTOSCOPY WITH RETROGRADE PYELOGRAM/URETERAL STENT PLACEMENT;  Surgeon: Jannifer Hick, MD;  Location: [facility];  Service: Urology;  Laterality: Left;   CYSTOSCOPY/URETEROSCOPY/HOLMIUM LASER/STENT PLACEMENT Left 12/05/2021   Procedure: CYSTOSCOPY/ RETROGRADE/URETEROSCOPY/HOLMIUM LASER/STENT PLACEMENT;  Surgeon: Jannifer Hick, MD;  Location: [facility];  Service: Urology;  Laterality: Left;   LAPAROSCOPIC BILATERAL SALPINGO OOPHERECTOMY Bilateral 11/12/2015   Procedure: LAPAROSCOPIC BILATERAL SALPINGO OOPHORECTOMY;  Surgeon: Osborn Coho, MD;  Location: [facility];  Service: Gynecology;  Laterality: Bilateral;   LAPAROSCOPY N/A 11/22/2015   Procedure: LAPAROSCOPY DIAGNOSTIC with repair of serosal tear;  Surgeon: Osborn Coho, MD;  Location: [facility];  Service: Gynecology;  Laterality: N/A;   RADIOACTIVE SEED GUIDED EXCISIONAL BREAST BIOPSY Left 09/26/2015   Procedure: LEFT RADIOACTIVE SEED GUIDED EXCISIONAL BREAST BIOPSY;  Surgeon: Almond Lint, MD;  Location: [facility];  Service: General;  Laterality: Left;   RADIOACTIVE SEED GUIDED EXCISIONAL BREAST BIOPSY Left 02/20/2020   Procedure: LEFT RADIOACTIVE SEED GUIDED EXCISIONAL BREAST BIOPSY;  Surgeon: Almond Lint, MD;  Location: [facility];  Service: General;  Laterality: Left;  RNFA   WISDOM TOOTH EXTRACTION     Social History   Social History Narrative   Not on file   Immunization History  Administered Date(s) Administered   COVID-19, mRNA, vaccine(Comirnaty)12 years and older 06/02/2022   Fluad Quad(high Dose 65+) 03/06/2020, 03/26/2021, 04/02/2022   Influenza, High Dose Seasonal PF 02/25/2018, 03/01/2019   Influenza-Unspecified 01/25/2017   PFIZER Comirnaty(Gray Top)Covid-19 Tri-Sucrose Vaccine 10/02/2020   PFIZER(Purple Top)SARS-COV-2 Vaccination 06/03/2019, 06/28/2019, 01/30/2020, 10/02/2020, 09/01/2021   Pfizer Covid-19 Vaccine Bivalent Booster 61yrs & up 06/02/2022   Pneumococcal Conjugate-13 03/22/2013   Pneumococcal Polysaccharide-23 03/12/2020   Rsv, Bivalent, Protein Subunit Rsvpref,pf Verdis Frederickson) 06/02/2022   Tdap 04/02/2022   Zoster Recombinat (Shingrix) 09/01/2021     Objective: Vital Signs: BP 116/80 (BP Location: Right Arm, Patient Position: Sitting, Cuff Size: Normal)   Pulse 71   Resp 14   Ht 5' 3.5" (1.613 m)   Wt 198 lb 3.2 oz (89.9 kg)   BMI 34.56 kg/m  Physical Exam Vitals and nursing note reviewed.  Constitutional:      Appearance: She is well-developed.  HENT:     Head: Normocephalic and atraumatic.  Eyes:     Conjunctiva/sclera: Conjunctivae normal.  Cardiovascular:     Rate and Rhythm: Normal rate and regular rhythm.     Heart sounds: Normal heart sounds.  Pulmonary:     Effort: Pulmonary effort is normal.     Breath sounds: Normal breath sounds.  Abdominal:     General: Bowel sounds are normal.     Palpations: Abdomen is soft.  Musculoskeletal:     Cervical back: Normal range of motion.  Skin:    General: Skin is warm and dry.     Capillary Refill: Capillary refill takes less than 2 seconds.  Neurological:     Mental Status: She is alert and oriented to person, place, and time.  Psychiatric:        Behavior: Behavior  normal.      Musculoskeletal Exam: C-spine, thoracic spine, lumbar spine have good range of motion.  No midline spinal tenderness.  Shoulder joints, elbow joints, wrist joints, MCPs, PIPs, DIPs have good range of motion with no synovitis.  Complete fist formation bilaterally.  Hip joints have good range of motion with no groin pain.  Knee joints have good range of motion with no warmth or effusion.  Ankle joints have good range of motion with no tenderness or joint swelling.  CDAI Exam: CDAI Score: -- Patient Global: --; Provider Global: -- Swollen: --; Tender: -- Joint Exam 08/10/2022   No joint exam has been documented for this visit   There is currently no information documented on the homunculus. Go to the Rheumatology activity and complete the homunculus joint exam.  Investigation: No additional findings.  Imaging: No results found.  Recent Labs: Lab Results  Component Value Date   WBC 6.5 04/02/2022   HGB 15.3 04/02/2022   PLT 242 04/02/2022   NA 143 04/02/2022   K 4.1 04/02/2022   CL 105 04/02/2022   CO2 25 04/02/2022   GLUCOSE 75 04/02/2022   BUN 19 04/02/2022   CREATININE 1.20 (H) 04/02/2022   BILITOT 0.8 04/02/2022   ALKPHOS 58 04/02/2022   AST 19 04/02/2022   ALT 11 04/02/2022   PROT 7.3 04/02/2022   ALBUMIN 4.4 04/02/2022   CALCIUM 10.1 04/02/2022   GFRAA 44 (L) 08/29/2019    Speciality Comments: No specialty comments available.  Procedures:  No procedures performed Allergies: Dilantin [phenytoin sodium extended]     Assessment / Plan:     Visit Diagnoses: Osteopenia of multiple sites - DEXA updated on 04/23/2022: Left femoral neck BMD 0.717 with T-score -2.1.  Statistically significant decrease in BMD of AP spine and right hip.  Statistically significant increase in BMD of the left hip. Previous DEXA 12/26/18: LFN BMD 0.688 T-score -1.9. Patient has been on Prolia 60 mg subcutaneous injections every 6 months as ordered by Dr. Allyne Gee.  It is unclear  of the duration that she has been on Prolia but it appears that she was initiated on Prolia in August 2017.  She has been taking vitamin D supplement daily but has been told to avoid a calcium supplement.  Vitamin D was 57 and Calcium was 10.1 on 04/02/22.  She has not tried any other prescription medications for osteoporosis management.  No history of vertebral or stress fractures.  No recent falls. No history of GERD.  No hx of seizures. No family  history of osteoporosis.  No history of breast cancer or bone cancer.  She has been walking on the treadmill 5 days a week.  Discussed the importance of performing resistive exercises/strength training exercises. Her last Prolia injection was administered on 05/26/2022 at Dr. Allyne Gee office.  She has been tolerating Prolia without any side effects or injection site reactions.  She previously was receiving Prolia every May and every November.  The patient was unsure why she was referred to [place] for management of osteoporosis. Reviewed her most recent DEXA results as well as her previous DEXA results today with her in detail.  All questions were addressed.  Discussed the risks of discontinuing Prolia including increased risk for a drop in BMD.  Reviewed the indications, contraindications, and potential side effects of Prolia today. Plan on reaching out to Dr. Allyne Gee office to clarify if her office would like [place] to take over the management of Prolia going forward. Also plan to reach out to [facility] for clarification of the increased risk decrease in BMD of the AP spine on her recent scan.   Estrogen deficiency  Stage 3a chronic kidney disease: Creatinine was 1.20 and GFR was 49 on 04/02/2022.  Recommend avoiding the use of bisphosphonates.   Other medical conditions are listed as follows:  Essential hypertension: Blood pressure is 116/80 today in the office.  Postinflammatory pulmonary fibrosis - Dr. Sherene Sires.  Small bowel obstruction  Acquired  hypothyroidism  Hypertensive nephropathy  Class 2 severe obesity due to excess calories with serious comorbidity and body mass index (BMI) of 36.0 to 36.9 in adult  Primary insomnia  S/P vaginal hysterectomy  Orders: No orders of the defined types were placed in this encounter.  No orders of the defined types were placed in this encounter.    Follow-Up Instructions: Return for Osteopenia.   Gearldine Bienenstock, PA-C  Note - This record has been created using Dragon software.  Chart creation errors have been sought, but may not always  have been located. Such creation errors do not reflect on  the standard of medical care.

## 2022-07-30 ENCOUNTER — Other Ambulatory Visit: Payer: Self-pay | Admitting: Internal Medicine

## 2022-07-30 ENCOUNTER — Other Ambulatory Visit: Payer: Self-pay

## 2022-07-30 DIAGNOSIS — F5101 Primary insomnia: Secondary | ICD-10-CM

## 2022-07-30 MED ORDER — TEMAZEPAM 15 MG PO CAPS: ORAL_CAPSULE | ORAL | 1 refills | Status: DC

## 2022-08-10 ENCOUNTER — Encounter: Payer: Self-pay | Admitting: Physician Assistant

## 2022-08-10 ENCOUNTER — Ambulatory Visit: Payer: Medicare HMO | Attending: Physician Assistant | Admitting: Physician Assistant

## 2022-08-10 VITALS — BP 116/80 | HR 71 | Resp 14 | Ht 63.5 in | Wt 198.2 lb

## 2022-08-10 DIAGNOSIS — N1831 Chronic kidney disease, stage 3a: Secondary | ICD-10-CM | POA: Diagnosis not present

## 2022-08-10 DIAGNOSIS — M8589 Other specified disorders of bone density and structure, multiple sites: Secondary | ICD-10-CM | POA: Diagnosis not present

## 2022-08-10 DIAGNOSIS — F5101 Primary insomnia: Secondary | ICD-10-CM

## 2022-08-10 DIAGNOSIS — E039 Hypothyroidism, unspecified: Secondary | ICD-10-CM

## 2022-08-10 DIAGNOSIS — Z6836 Body mass index (BMI) 36.0-36.9, adult: Secondary | ICD-10-CM

## 2022-08-10 DIAGNOSIS — I129 Hypertensive chronic kidney disease with stage 1 through stage 4 chronic kidney disease, or unspecified chronic kidney disease: Secondary | ICD-10-CM

## 2022-08-10 DIAGNOSIS — E2839 Other primary ovarian failure: Secondary | ICD-10-CM | POA: Diagnosis not present

## 2022-08-10 DIAGNOSIS — Z90710 Acquired absence of both cervix and uterus: Secondary | ICD-10-CM

## 2022-08-10 DIAGNOSIS — K56609 Unspecified intestinal obstruction, unspecified as to partial versus complete obstruction: Secondary | ICD-10-CM

## 2022-08-10 DIAGNOSIS — J8410 Pulmonary fibrosis, unspecified: Secondary | ICD-10-CM

## 2022-08-10 DIAGNOSIS — I10 Essential (primary) hypertension: Secondary | ICD-10-CM | POA: Diagnosis not present

## 2022-08-11 ENCOUNTER — Other Ambulatory Visit (HOSPITAL_COMMUNITY): Payer: Self-pay

## 2022-08-11 ENCOUNTER — Telehealth: Payer: Self-pay | Admitting: Pharmacist

## 2022-08-11 DIAGNOSIS — M8589 Other specified disorders of bone density and structure, multiple sites: Secondary | ICD-10-CM

## 2022-08-11 NOTE — Telephone Encounter (Addendum)
It appears Dr. Allyne Gee' office was shipping medication from [facility].  Unable to run test claim through pharmacy benefit b/c refill too soon (earliest fill date is 09/08/22). She is due 11/22/22 for next Prolia injection. Will run benefits in June 2024.  Chesley Mires, PharmD, MPH, BCPS, CPP Clinical Pharmacist (Rheumatology and Pulmonology)  ----- Message from Ellen Henri, CMA sent at 08/10/2022  4:20 PM EDT ----- Patient was seen as a new patient today by Ladona Ridgel and we will be taking over prolia. She will be due in July. Thanks!

## 2022-10-05 ENCOUNTER — Encounter: Payer: Self-pay | Admitting: Internal Medicine

## 2022-10-05 ENCOUNTER — Ambulatory Visit (INDEPENDENT_AMBULATORY_CARE_PROVIDER_SITE_OTHER): Payer: Medicare HMO | Admitting: Internal Medicine

## 2022-10-05 VITALS — BP 114/80 | Temp 98.4°F | Ht 63.5 in | Wt 195.6 lb

## 2022-10-05 DIAGNOSIS — N1831 Chronic kidney disease, stage 3a: Secondary | ICD-10-CM

## 2022-10-05 DIAGNOSIS — R7309 Other abnormal glucose: Secondary | ICD-10-CM | POA: Diagnosis not present

## 2022-10-05 DIAGNOSIS — I129 Hypertensive chronic kidney disease with stage 1 through stage 4 chronic kidney disease, or unspecified chronic kidney disease: Secondary | ICD-10-CM

## 2022-10-05 DIAGNOSIS — Z6834 Body mass index (BMI) 34.0-34.9, adult: Secondary | ICD-10-CM

## 2022-10-05 DIAGNOSIS — M85852 Other specified disorders of bone density and structure, left thigh: Secondary | ICD-10-CM

## 2022-10-05 DIAGNOSIS — L659 Nonscarring hair loss, unspecified: Secondary | ICD-10-CM | POA: Diagnosis not present

## 2022-10-05 DIAGNOSIS — E6609 Other obesity due to excess calories: Secondary | ICD-10-CM

## 2022-10-05 DIAGNOSIS — Z79899 Other long term (current) drug therapy: Secondary | ICD-10-CM

## 2022-10-05 NOTE — Progress Notes (Signed)
I,Yamilka Littleton,acting as a Neurosurgeon for Gwynneth Aliment, MD.,have documented all relevant documentation on the behalf of Gwynneth Aliment, MD,as directed by  Gwynneth Aliment, MD while in the presence of Gwynneth Aliment, MD.  Subjective:  Patient ID: Rachel [name] , female    DOB: [birth year] , 72 y.o.   MRN: [id number]  Chief Complaint  Patient presents with   Hypertension    HPI  The patient is here today for a blood pressure and thyroid f/u.  She reports compliance with meds. She denies headaches, chest pain and shortness of breath. Patient had a question about the medication Nutrafol for thinning hair.   Hypertension This is a chronic problem. The current episode started more than 1 year ago. The problem has been gradually improving since onset. The problem is controlled. Pertinent negatives include no blurred vision, chest pain or shortness of breath. Risk factors for coronary artery disease include dyslipidemia, obesity, post-menopausal state and sedentary lifestyle. The current treatment provides moderate improvement.     Past Medical History:  Diagnosis Date   CKD (chronic kidney disease), stage III [facility])    nephrologist--- dr l. foster   GERD (gastroesophageal reflux disease)    Glaucoma, right eye    Hiatal hernia    History of gastric ulcer    per egd 2019 non-bleeding   History of spontaneous subarachnoid intracranial hemorrhage due to cerebral aneurysm 03/2004   due to A-com region bilobed aneursym  s/p craniotomy with clipping;  per pt no residual and no issues since   Hypertension    followed by pcp   Hypertensive nephropathy    Hypothyroidism    endocrinologist--- dr Lonzo Cloud   IDA (iron deficiency anemia)    Insomnia    Mastodynia of left breast    OAB (overactive bladder)    Osteoporosis    Renal calculus, left    Wears glasses    Wears partial dentures    lower     Family History  Problem Relation Age of Onset   Cancer Mother        Breast     Hypertension Mother    Cancer Father        Prostate   Hypertension Father    Cancer Sister        Breast   Prostate cancer Brother    Healthy Son      Current Outpatient Medications:    calcium carbonate (TUMS - DOSED IN MG ELEMENTAL CALCIUM) 500 MG chewable tablet, Chew 4 tablets by mouth 2 (two) times daily as needed for indigestion or heartburn., Disp: , Rfl:    cholecalciferol (VITAMIN D3) 25 MCG (1000 UNIT) tablet, Take 1,000 Units by mouth daily., Disp: , Rfl:    COMBIGAN 0.2-0.5 % ophthalmic solution, Place 1 drop into the right eye 2 (two) times daily., Disp: , Rfl: 6   denosumab (PROLIA) 60 MG/ML SOSY injection, TO BE ADMINISTERED IN PHYSICIAN'S OFFICE. INJECT ONE SYRINGE SUBCUTANEOUSLY ONCE EVERY 6 MONTHS. REFRIGERATE. USE WITHIN 14 DAYS ONCE AT ROOM TEMPERATURE., Disp: 1 mL, Rfl: 2   famotidine (PEPCID) 40 MG tablet, Take 40 mg by mouth 2 (two) times daily., Disp: , Rfl:    Ferrous Gluconate (IRON 27 PO), Take 1 tablet by mouth at bedtime., Disp: , Rfl:    Magnesium 250 MG TABS, Take 250 mg by mouth at bedtime., Disp: , Rfl:    simvastatin (ZOCOR) 40 MG tablet, Take 1 tablet (40 mg total) by mouth at bedtime.,  Disp: 90 tablet, Rfl: 1   SYNTHROID 100 MCG tablet, Take 1 tablet (100 mcg total) by mouth daily before breakfast., Disp: 90 tablet, Rfl: 3   temazepam (RESTORIL) 15 MG capsule, TAKE 1 CAPSULE BY MOUTH EVERY DAY AT BEDTIME AS NEEDED, Disp: 30 capsule, Rfl: 1   TOLTERODINE TARTRATE PO, Take 2 mg by mouth daily at 12 noon., Disp: , Rfl:    valsartan-hydrochlorothiazide (DIOVAN-HCT) 80-12.5 MG tablet, Take 1 tablet by mouth daily., Disp: 90 tablet, Rfl: 1   Allergies  Allergen Reactions   Dilantin [Phenytoin Sodium Extended] Rash     Review of Systems  Constitutional: Negative.   Eyes:  Negative for blurred vision.  Respiratory: Negative.  Negative for shortness of breath.   Cardiovascular: Negative.  Negative for chest pain.  Gastrointestinal: Negative.    Musculoskeletal: Negative.   Skin: Negative.   Neurological: Negative.   Psychiatric/Behavioral: Negative.       Today's Vitals   10/05/22 1420  BP: 114/80  Temp: 98.4 F (36.9 C)  Weight: 195 lb 9.6 oz (88.7 kg)  Height: 5' 3.5" (1.613 m)  PainSc: 0-No pain   Body mass index is 34.11 kg/m.  Wt Readings from Last 3 Encounters:  10/05/22 195 lb 9.6 oz (88.7 kg)  08/10/22 198 lb 3.2 oz (89.9 kg)  07/01/22 196 lb (88.9 kg)    The 10-year ASCVD risk score (Arnett DK, et al., 2019) is: 17.8%   Values used to calculate the score:     Age: 15 years     Sex: Female     Is Non-Hispanic African American: Yes     Diabetic: No     Tobacco smoker: Yes     Systolic Blood Pressure: 114 mmHg     Is BP treated: Yes     HDL Cholesterol: 51 mg/dL     Total Cholesterol: 182 mg/dL  Objective:  Physical Exam Vitals and nursing note reviewed.  Constitutional:      Appearance: Normal appearance.  HENT:     Head: Normocephalic and atraumatic.  Eyes:     Extraocular Movements: Extraocular movements intact.  Cardiovascular:     Rate and Rhythm: Normal rate and regular rhythm.     Heart sounds: Normal heart sounds.  Pulmonary:     Effort: Pulmonary effort is normal.     Breath sounds: Normal breath sounds.  Musculoskeletal:     Cervical back: Normal range of motion.  Skin:    General: Skin is warm.  Neurological:     General: No focal deficit present.     Mental Status: She is alert.  Psychiatric:        Mood and Affect: Mood normal.        Behavior: Behavior normal.         Assessment And Plan:  1. Hypertensive nephropathy Comments: Chronic, well controlled. She will c/w valsartan/hct 80/12.5mg  daily. She is encouraged to follow low sodium diet. She will f/u in 4-6 months. - CMP14+EGFR  2. Stage 3a chronic kidney disease (HCC) Comments: Chronic, reminded to avoid NSAIDS, stay well hydrated and keep BP controlled to decrease risk of CKD progression. - CMP14+EGFR  3.  Other abnormal glucose Comments: Previous labs reviewed, her a1c has been elevated in the past. I will recheck an a1c today. Encouraged to decrease her intake of sugary beverages/foods. - Hemoglobin A1c  4. Hair thinning Comments: I will check labs as below. Pt advised that she may benefit from Nutrafol supplements. She declines Derm referral. -  ANA, IFA (with reflex) - Iron, TIBC and Ferritin Panel - CBC no Diff  5. Osteopenia of neck of left femur Comments: Bone density last performed Dec 2023 at [facility]. She is now followed by Rheumatology.  She is currently on Prolia.  6. Class 1 obesity due to excess calories with serious comorbidity and body mass index (BMI) of 34.0 to 34.9 in adult Comments: She is encouraged to strive to lose ten pounds before her physical in Dec 2024.  7. Polypharmacy - Vitamin B12    Return if symptoms worsen or fail to improve, for keep next appt as scheduled.  Patient was given opportunity to ask questions. Patient verbalized understanding of the plan and was able to repeat key elements of the plan. All questions were answered to their satisfaction.   I, Gwynneth Aliment, MD, have reviewed all documentation for this visit. The documentation on 10/05/22 for the exam, diagnosis, procedures, and orders are all accurate and complete.   IF YOU HAVE BEEN REFERRED TO A SPECIALIST, IT MAY TAKE 1-2 WEEKS TO SCHEDULE/PROCESS THE REFERRAL. IF YOU HAVE NOT HEARD FROM US/SPECIALIST IN TWO WEEKS, PLEASE GIVE [place] A CALL AT [phone].

## 2022-10-05 NOTE — Patient Instructions (Addendum)
Hair thinning - Nutrafol, women over 45+, hair growth supplement  Hypertension, Adult Hypertension is another name for high blood pressure. High blood pressure forces your heart to work harder to pump blood. This can cause problems over time. There are two numbers in a blood pressure reading. There is a top number (systolic) over a bottom number (diastolic). It is best to have a blood pressure that is below 120/80. What are the causes? The cause of this condition is not known. Some other conditions can lead to high blood pressure. What increases the risk? Some lifestyle factors can make you more likely to develop high blood pressure: Smoking. Not getting enough exercise or physical activity. Being overweight. Having too much fat, sugar, calories, or salt (sodium) in your diet. Drinking too much alcohol. Other risk factors include: Having any of these conditions: Heart disease. Diabetes. High cholesterol. Kidney disease. Obstructive sleep apnea. Having a family history of high blood pressure and high cholesterol. Age. The risk increases with age. Stress. What are the signs or symptoms? High blood pressure may not cause symptoms. Very high blood pressure (hypertensive crisis) may cause: Headache. Fast or uneven heartbeats (palpitations). Shortness of breath. Nosebleed. Vomiting or feeling like you may vomit (nauseous). Changes in how you see. Very bad chest pain. Feeling dizzy. Seizures. How is this treated? This condition is treated by making healthy lifestyle changes, such as: Eating healthy foods. Exercising more. Drinking less alcohol. Your doctor may prescribe medicine if lifestyle changes do not help enough and if: Your top number is above 130. Your bottom number is above 80. Your personal target blood pressure may vary. Follow these instructions at home: Eating and drinking  If told, follow the DASH eating plan. To follow this plan: Fill one half of your plate at  each meal with fruits and vegetables. Fill one fourth of your plate at each meal with whole grains. Whole grains include whole-wheat pasta, brown rice, and whole-grain bread. Eat or drink low-fat dairy products, such as skim milk or low-fat yogurt. Fill one fourth of your plate at each meal with low-fat (lean) proteins. Low-fat proteins include fish, chicken without skin, eggs, beans, and tofu. Avoid fatty meat, cured and processed meat, or chicken with skin. Avoid pre-made or processed food. Limit the amount of salt in your diet to less than 1,500 mg each day. Do not drink alcohol if: Your doctor tells you not to drink. You are pregnant, may be pregnant, or are planning to become pregnant. If you drink alcohol: Limit how much you have to: 0-1 drink a day for women. 0-2 drinks a day for men. Know how much alcohol is in your drink. In the [place] one drink equals one 12 oz bottle of beer (355 mL), one 5 oz glass of wine (148 mL), or one 1 oz glass of hard liquor (44 mL). Lifestyle  Work with your doctor to stay at a healthy weight or to lose weight. Ask your doctor what the best weight is for you. Get at least 30 minutes of exercise that causes your heart to beat faster (aerobic exercise) most days of the week. This may include walking, swimming, or biking. Get at least 30 minutes of exercise that strengthens your muscles (resistance exercise) at least 3 days a week. This may include lifting weights or doing Pilates. Do not smoke or use any products that contain nicotine or tobacco. If you need help quitting, ask your doctor. Check your blood pressure at home as told by your  doctor. Keep all follow-up visits. Medicines Take over-the-counter and prescription medicines only as told by your doctor. Follow directions carefully. Do not skip doses of blood pressure medicine. The medicine does not work as well if you skip doses. Skipping doses also puts you at risk for problems. Ask your doctor  about side effects or reactions to medicines that you should watch for. Contact a doctor if: You think you are having a reaction to the medicine you are taking. You have headaches that keep coming back. You feel dizzy. You have swelling in your ankles. You have trouble with your vision. Get help right away if: You get a very bad headache. You start to feel mixed up (confused). You feel weak or numb. You feel faint. You have very bad pain in your: Chest. Belly (abdomen). You vomit more than once. You have trouble breathing. These symptoms may be an emergency. Get help right away. Call 911. Do not wait to see if the symptoms will go away. Do not drive yourself to the hospital. Summary Hypertension is another name for high blood pressure. High blood pressure forces your heart to work harder to pump blood. For most people, a normal blood pressure is less than 120/80. Making healthy choices can help lower blood pressure. If your blood pressure does not get lower with healthy choices, you may need to take medicine. This information is not intended to replace advice given to you by your health care provider. Make sure you discuss any questions you have with your health care provider. Document Revised: 01/30/2021 Document Reviewed: 01/30/2021 Elsevier Patient Education  2024 [employer].

## 2022-10-08 LAB — CBC
Hematocrit: 46.5 % (ref 34.0–46.6)
Hemoglobin: 15.7 g/dL (ref 11.1–15.9)
MCH: 32.7 pg (ref 26.6–33.0)
MCHC: 33.8 g/dL (ref 31.5–35.7)
MCV: 97 fL (ref 79–97)
Platelets: 225 10*3/uL (ref 150–450)
RBC: 4.8 x10E6/uL (ref 3.77–5.28)
RDW: 12.4 % (ref 11.7–15.4)
WBC: 6.7 10*3/uL (ref 3.4–10.8)

## 2022-10-08 LAB — CMP14+EGFR
ALT: 11 IU/L (ref 0–32)
AST: 17 IU/L (ref 0–40)
Albumin/Globulin Ratio: 1.3
Albumin: 4.1 g/dL (ref 3.8–4.8)
Alkaline Phosphatase: 57 IU/L (ref 44–121)
BUN/Creatinine Ratio: 16 (ref 12–28)
BUN: 18 mg/dL (ref 8–27)
Bilirubin Total: 0.9 mg/dL (ref 0.0–1.2)
CO2: 24 mmol/L (ref 20–29)
Calcium: 10 mg/dL (ref 8.7–10.3)
Chloride: 104 mmol/L (ref 96–106)
Creatinine, Ser: 1.1 mg/dL — ABNORMAL HIGH (ref 0.57–1.00)
Globulin, Total: 3.2 g/dL (ref 1.5–4.5)
Glucose: 88 mg/dL (ref 70–99)
Potassium: 4.6 mmol/L (ref 3.5–5.2)
Sodium: 142 mmol/L (ref 134–144)
Total Protein: 7.3 g/dL (ref 6.0–8.5)
eGFR: 54 mL/min/{1.73_m2} — ABNORMAL LOW (ref >59)

## 2022-10-08 LAB — IRON,TIBC AND FERRITIN PANEL
Ferritin: 150 ng/mL (ref 15–150)
Iron Saturation: 27 % (ref 15–55)
Iron: 70 ug/dL (ref 27–139)
Total Iron Binding Capacity: 264 ug/dL (ref 250–450)
UIBC: 194 ug/dL (ref 118–369)

## 2022-10-08 LAB — VITAMIN B12: Vitamin B-12: 728 pg/mL (ref 232–1245)

## 2022-10-08 LAB — HEMOGLOBIN A1C
Est. average glucose Bld gHb Est-mCnc: 128 mg/dL
Hgb A1c MFr Bld: 6.1 % — ABNORMAL HIGH (ref 4.8–5.6)

## 2022-10-08 LAB — ANTINUCLEAR ANTIBODIES, IFA: ANA Titer 1: NEGATIVE

## 2022-10-16 ENCOUNTER — Other Ambulatory Visit: Payer: Self-pay | Admitting: Internal Medicine

## 2022-11-13 ENCOUNTER — Other Ambulatory Visit (HOSPITAL_COMMUNITY): Payer: Self-pay

## 2022-11-13 MED ORDER — PROLIA 60 MG/ML ~~LOC~~ SOSY
60.0000 mg | PREFILLED_SYRINGE | SUBCUTANEOUS | 0 refills | Status: DC
Start: 2022-11-13 — End: 2023-06-24
  Filled 2022-11-13 – 2022-11-16 (×2): qty 1, 180d supply, fill #0

## 2022-11-13 NOTE — Telephone Encounter (Signed)
Spoke with pt who states that she is VERY agreeable with copay and would like to proceed through pharmacy benefits. Informed her that I would reach out to [facility] to discuss and if she has not already been scheduled for an appointment then [facility] will reach out to her to do so.   Pt states that she does have an OV scheduled with Sherron Ales on Tuesday the 23rd.

## 2022-11-13 NOTE — Telephone Encounter (Signed)
Test claim revealed that insurance covers $1,602.59, leaving patient with a copay of $155.00. Attempted to call pt to discuss, left VoiceMail requesting a return call. Direct office number provided.

## 2022-11-13 NOTE — Telephone Encounter (Signed)
Rx for Prolia sent to [facility]. Appt on 11/17/22  Chesley Mires, PharmD, MPH, BCPS, CPP Clinical Pharmacist (Rheumatology and Pulmonology)

## 2022-11-16 ENCOUNTER — Other Ambulatory Visit: Payer: Self-pay

## 2022-11-16 ENCOUNTER — Other Ambulatory Visit (HOSPITAL_COMMUNITY): Payer: Self-pay

## 2022-11-16 NOTE — Telephone Encounter (Signed)
Prolia delivered to Rheum clinic on 7/22

## 2022-11-17 ENCOUNTER — Ambulatory Visit: Payer: Medicare HMO | Admitting: Physician Assistant

## 2022-11-23 NOTE — Progress Notes (Signed)
Office Visit Note  Patient: Rachel [name]             Date of Birth: 19-Jul-1950           MRN: [id number]             PCP: Dorothyann Peng, MD Referring: Dorothyann Peng, MD Visit Date: 11/26/2022 Occupation: @GUAROCC @  Subjective:  Prolia    History of Present Illness: Rachel [name] is a 72 y.o. female with history of osteopenia.  Patient presents today to receive her bi-annual prolia injection.  She has tolerated Prolia in the past without any side effects or injection site reactions.  Patient has been taking a vitamin D supplement as recommended.  She denies any recent falls or fractures.  She denies any joint pain or joint swelling at this time.  She denies any new medical conditions.    Activities of Daily Living:  Patient reports morning stiffness for 0  none .   Patient Denies nocturnal pain.  Difficulty dressing/grooming: Denies Difficulty climbing stairs: Denies Difficulty getting out of chair: Denies Difficulty using hands for taps, buttons, cutlery, and/or writing: Denies  Review of Systems  Constitutional:  Negative for fatigue.  HENT:  Negative for mouth sores and mouth dryness.   Eyes:  Positive for dryness.  Respiratory:  Negative for shortness of breath.   Cardiovascular:  Negative for chest pain and palpitations.  Gastrointestinal:  Negative for blood in stool, constipation and diarrhea.  Endocrine: Negative for increased urination.  Genitourinary:  Negative for involuntary urination.  Musculoskeletal:  Negative for joint pain, gait problem, joint pain, joint swelling, myalgias, muscle weakness, morning stiffness, muscle tenderness and myalgias.  Skin:  Positive for hair loss. Negative for color change, rash and sensitivity to sunlight.  Allergic/Immunologic: Negative for susceptible to infections.  Neurological:  Negative for dizziness and headaches.  Hematological:  Negative for swollen glands.  Psychiatric/Behavioral:  Negative for depressed mood and  sleep disturbance. The patient is not nervous/anxious.     PMFS History:  Patient Active Problem List   Diagnosis Date Noted   Hair thinning 10/05/2022   Primary insomnia 10/12/2020   Osteopenia 10/12/2020   Stage 3a chronic kidney disease (HCC) 10/12/2020   Acquired hypothyroidism 08/16/2020   Class 2 severe obesity due to excess calories with serious comorbidity and body mass index (BMI) of 36.0 to 36.9 in adult (HCC) 02/25/2018   Primary hypothyroidism 02/25/2018   Other abnormal glucose 02/25/2018   Hypertensive nephropathy 02/25/2018   Chronic renal disease, stage II 02/25/2018   Postinflammatory pulmonary fibrosis (HCC) 11/09/2016   Morbid obesity due to excess calories (HCC) 12/17/2015   Small bowel obstruction (HCC) 11/19/2015   Nausea with vomiting 11/18/2015   S/P vaginal hysterectomy 11/12/2015   Essential hypertension 04/03/2013    Past Medical History:  Diagnosis Date   CKD (chronic kidney disease), stage III [facility])    nephrologist--- dr l. foster   GERD (gastroesophageal reflux disease)    Glaucoma, right eye    Hiatal hernia    History of gastric ulcer    per egd 2019 non-bleeding   History of spontaneous subarachnoid intracranial hemorrhage due to cerebral aneurysm 03/2004   due to A-com region bilobed aneursym  s/p craniotomy with clipping;  per pt no residual and no issues since   Hypertension    followed by pcp   Hypertensive nephropathy    Hypothyroidism    endocrinologist--- dr Lonzo Cloud   IDA (iron deficiency anemia)    Insomnia  Mastodynia of left breast    OAB (overactive bladder)    Osteoporosis    Renal calculus, left    Wears glasses    Wears partial dentures    lower    Family History  Problem Relation Age of Onset   Cancer Mother        Breast    Hypertension Mother    Cancer Father        Prostate   Hypertension Father    Cancer Sister        Breast   Prostate cancer Brother    Healthy Son    Past Surgical History:   Procedure Laterality Date   CEREBRAL ANEURYSM REPAIR  03/2004   @[facility]  by dr Cephus Slater   ;   craniotomy w/ clipping to A-com region for bilobed aneurysm (right skull base)   CLOSED REDUCTION NASAL FRACTURE N/A 06/21/2020   Procedure: CLOSED REDUCTION NASAL FRACTURE;  Surgeon: Laren Boom, DO;  Location: [facility] OR;  Service: ENT;  Laterality: N/A;   COLONOSCOPY WITH ESOPHAGOGASTRODUODENOSCOPY (EGD)  03/16/2018   CYSTOSCOPY W/ URETERAL STENT PLACEMENT Left 11/13/2021   Procedure: CYSTOSCOPY WITH RETROGRADE PYELOGRAM/URETERAL STENT PLACEMENT;  Surgeon: Jannifer Hick, MD;  Location: [facility];  Service: Urology;  Laterality: Left;   CYSTOSCOPY/URETEROSCOPY/HOLMIUM LASER/STENT PLACEMENT Left 12/05/2021   Procedure: CYSTOSCOPY/ RETROGRADE/URETEROSCOPY/HOLMIUM LASER/STENT PLACEMENT;  Surgeon: Jannifer Hick, MD;  Location: [facility];  Service: Urology;  Laterality: Left;   LAPAROSCOPIC BILATERAL SALPINGO OOPHERECTOMY Bilateral 11/12/2015   Procedure: LAPAROSCOPIC BILATERAL SALPINGO OOPHORECTOMY;  Surgeon: Osborn Coho, MD;  Location: [facility];  Service: Gynecology;  Laterality: Bilateral;   LAPAROSCOPY N/A 11/22/2015   Procedure: LAPAROSCOPY DIAGNOSTIC with repair of serosal tear;  Surgeon: Osborn Coho, MD;  Location: [facility];  Service: Gynecology;  Laterality: N/A;   RADIOACTIVE SEED GUIDED EXCISIONAL BREAST BIOPSY Left 09/26/2015   Procedure: LEFT RADIOACTIVE SEED GUIDED EXCISIONAL BREAST BIOPSY;  Surgeon: Almond Lint, MD;  Location: [facility] OR;  Service: General;  Laterality: Left;   RADIOACTIVE SEED GUIDED EXCISIONAL BREAST BIOPSY Left 02/20/2020   Procedure: LEFT RADIOACTIVE SEED GUIDED EXCISIONAL BREAST BIOPSY;  Surgeon: Almond Lint, MD;  Location: [facility];  Service: General;  Laterality: Left;  RNFA   WISDOM TOOTH EXTRACTION     Social History   Social History Narrative   Not on file   Immunization History  Administered Date(s) Administered   COVID-19,  mRNA, vaccine(Comirnaty)12 years and older 06/02/2022   Fluad Quad(high Dose 65+) 03/06/2020, 03/26/2021, 04/02/2022   Influenza, High Dose Seasonal PF 02/25/2018, 03/01/2019   Influenza-Unspecified 01/25/2017   PFIZER Comirnaty(Gray Top)Covid-19 Tri-Sucrose Vaccine 10/02/2020   PFIZER(Purple Top)SARS-COV-2 Vaccination 06/03/2019, 06/28/2019, 01/30/2020, 10/02/2020, 09/01/2021   Pfizer Covid-19 Vaccine Bivalent Booster 46yrs & up 06/02/2022   Pneumococcal Conjugate-13 03/22/2013   Pneumococcal Polysaccharide-23 03/12/2020   Respiratory Syncytial Virus Vaccine,Recomb Aduvanted(Arexvy) 06/02/2022   Rsv, Bivalent, Protein Subunit Rsvpref,pf Verdis Frederickson) 06/02/2022   Tdap 04/02/2022   Zoster Recombinant(Shingrix) 01/27/2021, 09/01/2021     Objective: Vital Signs: BP 112/80 (BP Location: Left Arm, Patient Position: Sitting, Cuff Size: Normal)   Pulse 85   Resp 16   Ht 5\' 4"  (1.626 m)   Wt 198 lb (89.8 kg)   BMI 33.99 kg/m    Physical Exam Vitals and nursing note reviewed.  Constitutional:      Appearance: She is well-developed.  HENT:     Head: Normocephalic and atraumatic.  Eyes:     Conjunctiva/sclera: Conjunctivae normal.  Cardiovascular:  Rate and Rhythm: Normal rate and regular rhythm.     Heart sounds: Normal heart sounds.  Pulmonary:     Effort: Pulmonary effort is normal.     Breath sounds: Normal breath sounds.  Abdominal:     General: Bowel sounds are normal.     Palpations: Abdomen is soft.  Musculoskeletal:     Cervical back: Normal range of motion.  Lymphadenopathy:     Cervical: No cervical adenopathy.  Skin:    General: Skin is warm and dry.     Capillary Refill: Capillary refill takes less than 2 seconds.  Neurological:     Mental Status: She is alert and oriented to person, place, and time.  Psychiatric:        Behavior: Behavior normal.     Musculoskeletal Exam: C-spine, thoracic spine, lumbar spine have good range of motion.  Shoulder joints, elbow  joints, wrist joints, MCPs, PIPs, DIPs have good range of motion with no synovitis.  Complete fist formation bilaterally.  Hip joints have good range of motion with no groin pain.  Knee joints have good range of motion with no warmth or effusion.  Ankle joints have good range of motion with no tenderness or joint swelling.  CDAI Exam: CDAI Score: -- Patient Global: --; Provider Global: -- Swollen: --; Tender: -- Joint Exam 11/26/2022   No joint exam has been documented for this visit   There is currently no information documented on the homunculus. Go to the Rheumatology activity and complete the homunculus joint exam.  Investigation: No additional findings.  Imaging: No results found.  Recent Labs: Lab Results  Component Value Date   WBC 6.7 10/05/2022   HGB 15.7 10/05/2022   PLT 225 10/05/2022   NA 142 10/05/2022   K 4.6 10/05/2022   CL 104 10/05/2022   CO2 24 10/05/2022   GLUCOSE 88 10/05/2022   BUN 18 10/05/2022   CREATININE 1.10 (H) 10/05/2022   BILITOT 0.9 10/05/2022   ALKPHOS 57 10/05/2022   AST 17 10/05/2022   ALT 11 10/05/2022   PROT 7.3 10/05/2022   ALBUMIN 4.1 10/05/2022   CALCIUM 10.0 10/05/2022   GFRAA 44 (L) 08/29/2019    Speciality Comments: No specialty comments available.  Procedures:  No procedures performed Allergies: Dilantin [phenytoin sodium extended]      Assessment / Plan:     Visit Diagnoses: Osteopenia of multiple sites: DEXA updated on 04/23/2022: Left femoral neck BMD 0.717 with T-score -2.1.  Statistically significant decrease in BMD of AP spine and right hip.  Statistically significant increase in BMD of the left hip. Previous DEXA 12/26/18: LFN BMD 0.688 T-score -1.9. She was initiated on Prolia in August 2017.  She has been taking vitamin D supplement daily but has been told to avoid a calcium supplement.  She has not tried any other prescription medications for osteoporosis management.  No history of vertebral or stress fractures.   No recent falls. No history of GERD.  No hx of seizures. No family history of osteoporosis.  No history of radiation therapy. She has been walking on the treadmill 5 days a week.  Discussed the importance of performing resistive exercises/strength training exercises. CBC and CMP updated on 10/05/22 Her last Prolia injection was administered on 05/26/2022 at Dr. Allyne Gee office.  Patient received her biannual Prolia injection today in the office.  She tolerated the injection without any immediate side effects. She will follow up in the office in 6 months or sooner if needed.  Other medical conditions are listed as follows:   Estrogen deficiency  Stage 3a chronic kidney disease (HCC): Creatinine was 1.10 and GFR was 54 on 10/05/2022.  Essential hypertension: Blood pressure is 112/80 today in the office.  Postinflammatory pulmonary fibrosis (HCC)  Small bowel obstruction (HCC)  Acquired hypothyroidism  Hypertensive nephropathy  Class 2 severe obesity due to excess calories with serious comorbidity and body mass index (BMI) of 36.0 to 36.9 in adult [facility])  Primary insomnia  S/P vaginal hysterectomy  Orders: No orders of the defined types were placed in this encounter.  Meds ordered this encounter  Medications   denosumab (PROLIA) injection 60 mg    Order Specific Question:   Patient is enrolled in REMS program for this medication and I have provided a copy of the Prolia Medication Guide and Patient Brochure.    Answer:   Yes    Order Specific Question:   I have reviewed with the patient the information in the Prolia Medication Guide and Patient Counseling Chart including the serious risks of Prolia and symptoms of each risk.    Answer:   Yes    Order Specific Question:   I have advised the patient to seek medical attention if they have signs or symptoms of any of the serious risks.    Answer:   Yes    Follow-Up Instructions: Return in about 6 months (around 05/29/2023) for Osteopenia  .   Gearldine Bienenstock, PA-C  Note - This record has been created using Dragon software.  Chart creation errors have been sought, but may not always  have been located. Such creation errors do not reflect on  the standard of medical care.

## 2022-11-26 ENCOUNTER — Ambulatory Visit: Payer: Medicare HMO | Attending: Physician Assistant | Admitting: Physician Assistant

## 2022-11-26 ENCOUNTER — Encounter: Payer: Self-pay | Admitting: Physician Assistant

## 2022-11-26 VITALS — BP 112/80 | HR 85 | Resp 16 | Ht 64.0 in | Wt 198.0 lb

## 2022-11-26 DIAGNOSIS — Z6836 Body mass index (BMI) 36.0-36.9, adult: Secondary | ICD-10-CM

## 2022-11-26 DIAGNOSIS — J8410 Pulmonary fibrosis, unspecified: Secondary | ICD-10-CM

## 2022-11-26 DIAGNOSIS — N1831 Chronic kidney disease, stage 3a: Secondary | ICD-10-CM

## 2022-11-26 DIAGNOSIS — I129 Hypertensive chronic kidney disease with stage 1 through stage 4 chronic kidney disease, or unspecified chronic kidney disease: Secondary | ICD-10-CM

## 2022-11-26 DIAGNOSIS — I10 Essential (primary) hypertension: Secondary | ICD-10-CM

## 2022-11-26 DIAGNOSIS — K56609 Unspecified intestinal obstruction, unspecified as to partial versus complete obstruction: Secondary | ICD-10-CM

## 2022-11-26 DIAGNOSIS — F5101 Primary insomnia: Secondary | ICD-10-CM

## 2022-11-26 DIAGNOSIS — M8589 Other specified disorders of bone density and structure, multiple sites: Secondary | ICD-10-CM | POA: Diagnosis not present

## 2022-11-26 DIAGNOSIS — Z7689 Persons encountering health services in other specified circumstances: Secondary | ICD-10-CM

## 2022-11-26 DIAGNOSIS — E2839 Other primary ovarian failure: Secondary | ICD-10-CM | POA: Diagnosis not present

## 2022-11-26 DIAGNOSIS — Z90710 Acquired absence of both cervix and uterus: Secondary | ICD-10-CM

## 2022-11-26 DIAGNOSIS — E039 Hypothyroidism, unspecified: Secondary | ICD-10-CM

## 2022-11-26 MED ORDER — DENOSUMAB 60 MG/ML ~~LOC~~ SOSY
60.0000 mg | PREFILLED_SYRINGE | Freq: Once | SUBCUTANEOUS | Status: AC
  Administered 2022-11-26: 60 mg via SUBCUTANEOUS

## 2022-11-26 NOTE — Progress Notes (Signed)
Pharmacy Note  Subjective:   Patient presents to clinic today to receive bi-annual dose of Prolia. Patient's last dose of Prolia was on 05/26/2022 (previously managed by her PCP, Dr. Allyne Gee)  Patient running a fever or have signs/symptoms of infection? No  Patient currently on antibiotics for the treatment of infection? No  Patient had fall in the last 6 months?  No    Patient taking calcium 1200 mg daily through diet or supplement and at least 800 units vitamin D? Yes. She does not utilize calcium supplement due to history of elevated calcium.   Objective: CMP     Component Value Date/Time   NA 142 10/05/2022 1537   K 4.6 10/05/2022 1537   CL 104 10/05/2022 1537   CO2 24 10/05/2022 1537   GLUCOSE 88 10/05/2022 1537   GLUCOSE 102 (H) 12/05/2021 1222   BUN 18 10/05/2022 1537   CREATININE 1.10 (H) 10/05/2022 1537   CALCIUM 10.0 10/05/2022 1537   PROT 7.3 10/05/2022 1537   ALBUMIN 4.1 10/05/2022 1537   AST 17 10/05/2022 1537   ALT 11 10/05/2022 1537   ALKPHOS 57 10/05/2022 1537   BILITOT 0.9 10/05/2022 1537   GFRNONAA 47 (L) 11/13/2021 1430   GFRAA 44 (L) 08/29/2019 1523    CBC    Component Value Date/Time   WBC 6.7 10/05/2022 1537   WBC 5.6 11/13/2021 1353   RBC 4.80 10/05/2022 1537   RBC 4.28 11/13/2021 1353   HGB 15.7 10/05/2022 1537   HCT 46.5 10/05/2022 1537   PLT 225 10/05/2022 1537   MCV 97 10/05/2022 1537   MCH 32.7 10/05/2022 1537   MCH 32.7 11/13/2021 1353   MCHC 33.8 10/05/2022 1537   MCHC 31.9 11/13/2021 1353   RDW 12.4 10/05/2022 1537   LYMPHSABS 1.7 11/13/2021 1353   MONOABS 0.6 11/13/2021 1353   EOSABS 0.2 11/13/2021 1353   BASOSABS 0.0 11/13/2021 1353    Lab Results  Component Value Date   VD25OH 57.0 04/02/2022    T-score: DEXA updated on 04/23/2022: Left femoral neck BMD 0.717 with T-score -2.1.  Statistically significant decrease in BMD of AP spine and right hip.  Statistically significant increase in BMD of the left hip. Previous DEXA  12/26/18: LFN BMD 0.688 T-score -1.9.  Assessment/Plan:   Reviewed importance of adequate dietary intake of calcium in addition to supplementation due to risk of hypocalcemia with Prolia.   Patient tolerated injection without issue.  Administrations This Visit     denosumab (PROLIA) injection 60 mg     Admin Date 11/26/2022 Action Given Dose 60 mg Route Subcutaneous Documented By Murrell Redden, RPH-CPP           Patient's next Prolia dose is due on 05/25/2023.  Patient is due for updated DEXA in December 2025.   All questions encouraged and answered.  Instructed patient to call with any further questions or concerns.   Chesley Mires, PharmD, MPH, BCPS, CPP Clinical Pharmacist (Rheumatology and Pulmonology)

## 2022-12-08 LAB — HM MAMMOGRAPHY

## 2022-12-25 ENCOUNTER — Other Ambulatory Visit: Payer: Self-pay | Admitting: Internal Medicine

## 2022-12-25 DIAGNOSIS — I10 Essential (primary) hypertension: Secondary | ICD-10-CM

## 2023-01-11 ENCOUNTER — Other Ambulatory Visit: Payer: Self-pay

## 2023-01-11 ENCOUNTER — Emergency Department (HOSPITAL_COMMUNITY): Payer: Medicare HMO

## 2023-01-11 ENCOUNTER — Encounter (HOSPITAL_COMMUNITY): Payer: Self-pay | Admitting: *Deleted

## 2023-01-11 ENCOUNTER — Emergency Department (HOSPITAL_COMMUNITY)
Admission: EM | Admit: 2023-01-11 | Discharge: 2023-01-11 | Disposition: A | Discharge status: Home / Self Care | Payer: Medicare HMO | Attending: Emergency Medicine | Admitting: Emergency Medicine

## 2023-01-11 DIAGNOSIS — I129 Hypertensive chronic kidney disease with stage 1 through stage 4 chronic kidney disease, or unspecified chronic kidney disease: Secondary | ICD-10-CM | POA: Diagnosis not present

## 2023-01-11 DIAGNOSIS — E039 Hypothyroidism, unspecified: Secondary | ICD-10-CM | POA: Insufficient documentation

## 2023-01-11 DIAGNOSIS — N189 Chronic kidney disease, unspecified: Secondary | ICD-10-CM | POA: Diagnosis not present

## 2023-01-11 DIAGNOSIS — Z79899 Other long term (current) drug therapy: Secondary | ICD-10-CM | POA: Insufficient documentation

## 2023-01-11 DIAGNOSIS — R09A2 Foreign body sensation, throat: Secondary | ICD-10-CM | POA: Insufficient documentation

## 2023-01-11 MED ORDER — LIDOCAINE VISCOUS HCL 2 % MT SOLN
15.0000 mL | Freq: Once | OROMUCOSAL | Status: AC
  Administered 2023-01-11: 15 mL via ORAL
  Filled 2023-01-11: qty 15

## 2023-01-11 MED ORDER — ALUM & MAG HYDROXIDE-SIMETH 200-200-20 MG/5ML PO SUSP
30.0000 mL | Freq: Once | ORAL | Status: AC
  Administered 2023-01-11: 30 mL via ORAL
  Filled 2023-01-11: qty 30

## 2023-01-11 NOTE — Discharge Instructions (Signed)
You are tolerating oral Aken your symptoms resolved.  Your capsule likely could have gotten stuck causing that sensation but symptoms have since improved.  You could try breaking up the supplement and swallowing it with applesauce.

## 2023-01-11 NOTE — ED Provider Notes (Signed)
[place] EMERGENCY DEPARTMENT AT [facility] Provider Note   CSN: [id number] Arrival date & time: 01/11/23  1000     History  Chief Complaint  Patient presents with   Sore Throat    Rachel [name] is a 72 y.o. female.   Sore Throat     72 year old female with medical history significant for GERD, HTN, hypothyroidism, CKD, presenting to the emergency department with a chief complaint of foreign body sensation in her throat.  The patient states that she took a supplement for hair loss in capsule form last night.  She feels as if it is stuck in her throat.  She can feel it when she tries to stick her fingers down her throat.  She denies any vomiting and has been tolerating liquids.  She denies any significant pain or discomfort.  She endorses persistent sensation of a foreign body in her throat.  Home Medications Prior to Admission medications   Medication Sig Start Date End Date Taking? Authorizing Provider  calcium carbonate (TUMS - DOSED IN MG ELEMENTAL CALCIUM) 500 MG chewable tablet Chew 4 tablets by mouth 2 (two) times daily as needed for indigestion or heartburn.    [provider]  cholecalciferol (VITAMIN D3) 25 MCG (1000 UNIT) tablet Take 1,000 Units by mouth daily.    [provider]  COMBIGAN 0.2-0.5 % ophthalmic solution Place 1 drop into the right eye 2 (two) times daily. 08/16/15   [provider]  denosumab (PROLIA) 60 MG/ML SOSY injection Inject 60 mg into the skin every 6 (six) months. Courier to rheum: [address], [place] [address]. Appt 11/17/22 11/13/22   Gearldine Bienenstock, PA-C  famotidine (PEPCID) 40 MG tablet Take 40 mg by mouth 2 (two) times daily.    [provider]  Ferrous Gluconate (IRON 27 PO) Take 1 tablet by mouth at bedtime.    [provider]  Magnesium 250 MG TABS Take 250 mg by mouth at bedtime.    [provider]  nystatin-triamcinolone [facility] II) cream  SMARTSIG:Topical Morning-Evening 11/09/22   [provider]  simvastatin (ZOCOR) 40 MG tablet TAKE 1 TABLET BY MOUTH EVERYDAY AT BEDTIME 10/16/22   Dorothyann Peng, MD  SYNTHROID 100 MCG tablet Take 1 tablet (100 mcg total) by mouth daily before breakfast. 07/02/22   Shamleffer, Konrad Dolores, MD  temazepam (RESTORIL) 15 MG capsule TAKE 1 CAPSULE BY MOUTH EVERY DAY AT BEDTIME AS NEEDED 07/30/22   Dorothyann Peng, MD  TOLTERODINE TARTRATE PO Take 2 mg by mouth daily at 12 noon.    [provider]  valsartan-hydrochlorothiazide (DIOVAN-HCT) 80-12.5 MG tablet TAKE 1 TABLET BY MOUTH EVERY DAY 12/25/22   Dorothyann Peng, MD      Allergies    Dilantin [phenytoin sodium extended]    Review of Systems   Review of Systems  All other systems reviewed and are negative.   Physical Exam Updated Vital Signs BP (!) 136/93 (BP Location: Right Arm)   Pulse 98   Temp (!) 97.5 F (36.4 C) (Oral)   Resp 18   Ht 5\' 4"  (1.626 m)   Wt 86.2 kg   SpO2 99%   BMI 32.61 kg/m  Physical Exam Vitals and nursing note reviewed.  Constitutional:      General: She is not in acute distress. HENT:     Head: Normocephalic and atraumatic.     Comments: No masses or significant abnormality of the mouth or throat Eyes:  Conjunctiva/sclera: Conjunctivae normal.     Pupils: Pupils are equal, round, and reactive to light.  Cardiovascular:     Rate and Rhythm: Normal rate and regular rhythm.  Pulmonary:     Effort: Pulmonary effort is normal. No respiratory distress.  Abdominal:     General: There is no distension.     Tenderness: There is no guarding.  Musculoskeletal:        General: No deformity or signs of injury.     Cervical back: Neck supple.  Skin:    Findings: No lesion or rash.  Neurological:     General: No focal deficit present.     Mental Status: She is alert. Mental status is at baseline.     Comments: MENTAL STATUS EXAM:    Orientation: Alert and oriented to person, place and  time.  Memory: Cooperative, follows commands well.  Language: Speech is clear and language is normal.   CRANIAL NERVES:    CN 2 (Optic): Visual fields intact to confrontation.  CN 3,4,6 (EOM): Pupils equal and reactive to light. Full extraocular eye movement without nystagmus.  CN 5 (Trigeminal): Facial sensation is normal, no weakness of masticatory muscles.  CN 7 (Facial): No facial weakness or asymmetry.  CN 8 (Auditory): Auditory acuity grossly normal.  CN 9,10 (Glossophar): The uvula is midline, the palate elevates symmetrically.  CN 11 (spinal access): Normal sternocleidomastoid and trapezius strength.  CN 12 (Hypoglossal): The tongue is midline. No atrophy or fasciculations.Marland Kitchen   MOTOR:  Muscle Strength: 5/5RUE, 5/5LUE, 5/5RLE, 5/5LLE.   COORDINATION:   No tremor.   SENSATION:   Intact to light touch all four extremities.        ED Results / Procedures / Treatments   Labs (all labs ordered are listed, but only abnormal results are displayed) Labs Reviewed - No data to display  EKG None  Radiology DG Neck Soft Tissue  Result Date: 01/11/2023 CLINICAL DATA:  Foreign body sensation while swallowing. EXAM: NECK SOFT TISSUES - 1+ VIEW COMPARISON:  None Available. FINDINGS: Cervical vertebral body heights are preserved. Prevertebral soft tissues are normal. C1-2 the superior endplate of T1 is imaged on the provided lateral radiograph. Sequela of C7-T1 intervertebral disc space replacement. Mild DDD within the remainder of the cervical spine, worse at C5-C6 and C6-C7 with disc space height loss mild irregularity and sclerosis. No significant scoliotic curvature. Minimal (1-2 mm) of anterolisthesis of C4 upon C5. No retrolisthesis. Limited visualization of lung apices is normal given obliquity and technique. Suspected atherosclerotic plaque within the aortic arch. IMPRESSION: 1. Unremarkable soft tissue neck radiographs. No explanation for patient's foreign body sensation. 2. Sequela  of C7-T1 intervertebral disc space replacement with mild DDD within the remainder of the cervical spine, worse at C5-C6 and C6-C7. Electronically Signed   By: Simonne Come M.D.   On: 01/11/2023 12:16    Procedures Procedures    Medications Ordered in ED Medications  alum & mag hydroxide-simeth (MAALOX/MYLANTA) 200-200-20 MG/5ML suspension 30 mL (30 mLs Oral Given 01/11/23 1105)    And  lidocaine (XYLOCAINE) 2 % viscous mouth solution 15 mL (15 mLs Oral Given 01/11/23 1105)    ED Course/ Medical Decision Making/ A&P                                 Medical Decision Making Amount and/or Complexity of Data Reviewed Radiology: ordered.  Risk OTC drugs. Prescription drug management.  72 year old female with medical history significant for GERD, HTN, hypothyroidism, CKD, presenting to the emergency department with a chief complaint of foreign body sensation in her throat.  The patient states that she took a supplement for hair loss in capsule form last night.  She feels as if it is stuck in her throat.  She can feel it when she tries to stick her fingers down her throat.  She denies any vomiting and has been tolerating liquids.  She denies any significant pain or discomfort.  She endorses persistent sensation of a foreign body in her throat.  On arrival, the patient was vitally stable, presenting with foreign body sensation in the throat after swallowing a vitamin capsule.  Administered Maalox and lidocaine and had no difficulty tolerating oral intake.  No pain, still endorses persistent sensation after Maalox and lidocaine.  X-ray imaging of the neck was ordered.  XR Neck: Negative for radiopaque foreign body  Repeat assessment, the patient's symptoms had resolved.  Suspect likely the capsule has dissolved and the patient is tolerating oral intake without any other symptoms at this time. Recommended the patient break up the capsules it was notably quite large and try swallowing it in  applesauce.  No strokelike symptoms and the patient is neurologically intact.  Patient stable for discharge and outpatient follow-up.  Final Clinical Impression(s) / ED Diagnoses Final diagnoses:  Foreign body sensation in throat    Rx / DC Orders ED Discharge Orders     None         Ernie Avena, MD 01/11/23 1239

## 2023-01-11 NOTE — ED Triage Notes (Signed)
Patient states she took a vit last pm it was in capsule form and she feels like it is still in her throat. States when she sticks her fingers down her throat she can feel it. States she is able to swallow water and keep it down without difficulty. Denies pain

## 2023-01-18 ENCOUNTER — Other Ambulatory Visit: Payer: Self-pay | Admitting: Internal Medicine

## 2023-02-03 ENCOUNTER — Other Ambulatory Visit: Payer: Self-pay | Admitting: Nephrology

## 2023-02-03 DIAGNOSIS — N1831 Chronic kidney disease, stage 3a: Secondary | ICD-10-CM

## 2023-02-03 DIAGNOSIS — R7303 Prediabetes: Secondary | ICD-10-CM

## 2023-02-03 DIAGNOSIS — M8580 Other specified disorders of bone density and structure, unspecified site: Secondary | ICD-10-CM

## 2023-02-03 DIAGNOSIS — R3129 Other microscopic hematuria: Secondary | ICD-10-CM

## 2023-02-04 LAB — LAB REPORT - SCANNED
Creatinine, POC: 115.7 mg/dL
EGFR: 58

## 2023-02-08 ENCOUNTER — Encounter: Payer: Self-pay | Admitting: Internal Medicine

## 2023-02-08 ENCOUNTER — Ambulatory Visit
Admission: RE | Admit: 2023-02-08 | Discharge: 2023-02-08 | Disposition: A | Discharge status: Home / Self Care | Payer: Medicare HMO | Source: Ambulatory Visit | Attending: Nephrology

## 2023-02-08 DIAGNOSIS — M8580 Other specified disorders of bone density and structure, unspecified site: Secondary | ICD-10-CM

## 2023-02-08 DIAGNOSIS — R7303 Prediabetes: Secondary | ICD-10-CM

## 2023-02-08 DIAGNOSIS — N1831 Chronic kidney disease, stage 3a: Secondary | ICD-10-CM

## 2023-02-08 DIAGNOSIS — R3129 Other microscopic hematuria: Secondary | ICD-10-CM

## 2023-02-12 ENCOUNTER — Ambulatory Visit: Payer: Medicare HMO

## 2023-02-12 VITALS — BP 100/70 | HR 74 | Temp 98.7°F | Ht 64.0 in | Wt 190.0 lb

## 2023-02-12 DIAGNOSIS — I129 Hypertensive chronic kidney disease with stage 1 through stage 4 chronic kidney disease, or unspecified chronic kidney disease: Secondary | ICD-10-CM

## 2023-02-12 NOTE — Progress Notes (Signed)
Patient presents today for a BP check, patient was taking valsartan-hydrochlorothiazide 80-12.5mg  but she went to the kidney doctor and her her BP was low so her kidney doctor took her off valsartan-hydrochlorothiazide and just started her on valsartan 80mg .  BP Readings from Last 3 Encounters:  02/12/23 100/70  01/11/23 (!) 131/90  11/26/22 112/80   Per provider- Randie Heinz follow up next visit.

## 2023-03-08 ENCOUNTER — Encounter: Payer: Self-pay | Admitting: Internal Medicine

## 2023-03-08 ENCOUNTER — Ambulatory Visit (INDEPENDENT_AMBULATORY_CARE_PROVIDER_SITE_OTHER): Payer: Medicare HMO | Admitting: Internal Medicine

## 2023-03-08 VITALS — BP 160/98 | HR 83 | Temp 97.9°F | Ht 64.0 in | Wt 198.6 lb

## 2023-03-08 DIAGNOSIS — R195 Other fecal abnormalities: Secondary | ICD-10-CM

## 2023-03-08 DIAGNOSIS — R0602 Shortness of breath: Secondary | ICD-10-CM | POA: Diagnosis not present

## 2023-03-08 DIAGNOSIS — N1831 Chronic kidney disease, stage 3a: Secondary | ICD-10-CM

## 2023-03-08 DIAGNOSIS — E66811 Obesity, class 1: Secondary | ICD-10-CM | POA: Insufficient documentation

## 2023-03-08 DIAGNOSIS — Z79899 Other long term (current) drug therapy: Secondary | ICD-10-CM

## 2023-03-08 DIAGNOSIS — F5101 Primary insomnia: Secondary | ICD-10-CM

## 2023-03-08 DIAGNOSIS — R519 Headache, unspecified: Secondary | ICD-10-CM | POA: Diagnosis not present

## 2023-03-08 DIAGNOSIS — E6609 Other obesity due to excess calories: Secondary | ICD-10-CM

## 2023-03-08 DIAGNOSIS — I129 Hypertensive chronic kidney disease with stage 1 through stage 4 chronic kidney disease, or unspecified chronic kidney disease: Secondary | ICD-10-CM

## 2023-03-08 DIAGNOSIS — Z6834 Body mass index (BMI) 34.0-34.9, adult: Secondary | ICD-10-CM

## 2023-03-08 MED ORDER — TEMAZEPAM 15 MG PO CAPS: ORAL_CAPSULE | ORAL | 1 refills | Status: DC

## 2023-03-08 NOTE — Patient Instructions (Signed)
 General Headache Without Cause A headache is pain or discomfort you feel around the head or neck area. There are many causes and types of headaches. In some cases, the cause may not be found. Follow these instructions at home: Watch your condition for any changes. Let your doctor know about them. Take these steps to help with your condition: Managing pain     Take over-the-counter and prescription medicines only as told by your doctor. This includes medicines for pain that are taken by mouth or put on the skin. Lie down in a dark, quiet room when you have a headache. If told, put ice on your head and neck area: Put ice in a plastic bag. Place a towel between your skin and the bag. Leave the ice on for 20 minutes, 2-3 times per day. Take off the ice if your skin turns bright red. This is very important. If you cannot feel pain, heat, or cold, you have a greater risk of damage to the area. If told, put heat on the affected area. Use the heat source that your doctor recommends, such as a moist heat pack or a heating pad. Place a towel between your skin and the heat source. Leave the heat on for 20-30 minutes. Take off the heat if your skin turns bright red. This is very important. If you cannot feel pain, heat, or cold, you have a greater risk of getting burned. Keep lights dim if bright lights bother you or make your headaches worse. Eating and drinking Eat meals on a regular schedule. If you drink alcohol: Limit how much you have to: 0-1 drink a day for women who are not pregnant. 0-2 drinks a day for men. Know how much alcohol is in a drink. In the [place] one drink equals one 12 oz bottle of beer (355 mL), one 5 oz glass of wine (148 mL), or one 1 oz glass of hard liquor (44 mL). Stop drinking caffeine, or drink less caffeine. General instructions  Keep a journal to find out if certain things bring on headaches. For example, write down: What you eat and drink. How much sleep you  get. Any change to your diet or medicines. Get a massage or try other ways to relax. Limit stress. Sit up straight. Do not tighten (tense) your muscles. Do not smoke or use any products that contain nicotine or tobacco. If you need help quitting, ask your doctor. Exercise regularly as told by your doctor. Get enough sleep. This often means 7-9 hours of sleep each night. Keep all follow-up visits. This is important. Contact a doctor if: Medicine does not help your symptoms. You have a headache that feels different than the other headaches. You feel like you may vomit (nauseous) or you vomit. You have a fever. Get help right away if: Your headache: Gets very bad quickly. Gets worse after a lot of physical activity. You have any of these symptoms: You continue to vomit. A stiff neck. Trouble seeing. Your eye or ear hurts. Trouble speaking. Weak muscles or you lose muscle control. You lose your balance or have trouble walking. You feel like you will pass out (faint) or you pass out. You are mixed up (confused). You have a seizure. These symptoms may be an emergency. Get help right away. Call your local emergency services (911 in the [place] Do not wait to see if the symptoms will go away. Do not drive yourself to the hospital. Summary A headache is pain or discomfort that  is felt around the head or neck area. There are many causes and types of headaches. In some cases, the cause may not be found. Keep a journal to help find out what causes your headaches. Watch your condition for any changes. Let your doctor know about them. Contact a doctor if you have a headache that is different from usual, or if medicine does not help your headache. Get help right away if your headache gets very bad, you throw up, you have trouble seeing, you lose your balance, or you have a seizure. This information is not intended to replace advice given to you by your health care provider. Make sure you  discuss any questions you have with your health care provider. Document Revised: 09/11/2020 Document Reviewed: 09/11/2020 Elsevier Patient Education  2024 [employer].

## 2023-03-08 NOTE — Assessment & Plan Note (Addendum)
EKG performed, NSR w rate variation. No acute changes noted. I will check labs as below. She also admits she has not been exercising recently, sx could also be due to deconditioning.

## 2023-03-08 NOTE — Assessment & Plan Note (Signed)
 Chronic, she is encouraged to stay well hydrated, avoid NSAIDs and keep BP controlled to prevent progression of CKD.

## 2023-03-08 NOTE — Progress Notes (Signed)
I,Victoria T Deloria Lair, CMA,acting as a Neurosurgeon for Gwynneth Aliment, MD.,have documented all relevant documentation on the behalf of Gwynneth Aliment, MD,as directed by  Gwynneth Aliment, MD while in the presence of Gwynneth Aliment, MD.  Subjective:  Patient ID: Rachel [name] , female    DOB: [birth year] , 72 y.o.   MRN: [id number]  Chief Complaint  Patient presents with   Headache    HPI  Patient presents today for headache along with SOB. Denies chest pain & dizziness. She is not sure what has triggered her headaches. They are located in frontal area, described as dull and throbbing. She is not taking any new supplements. However, she does state her nephrologist, Dr. Malen Gauze, recently discontinued hydrochlorothiazide due to low BP readings. She states she has been checking her BP at home - states they are sometimes in the 100s/80s - 120s/80s. She denies having any visual disturbances or photophobia associated with the headaches. Her sx started about two weeks ago. She does admit to waking up with the headaches on occasion. She feels they have lessened over the past several days. She has been taking Tylenol which has provided some relief.        Past Medical History:  Diagnosis Date   CKD (chronic kidney disease), stage III [facility])    nephrologist--- dr l. foster   GERD (gastroesophageal reflux disease)    Glaucoma, right eye    Hiatal hernia    History of gastric ulcer    per egd 2019 non-bleeding   History of spontaneous subarachnoid intracranial hemorrhage due to cerebral aneurysm 03/2004   due to A-com region bilobed aneursym  s/p craniotomy with clipping;  per pt no residual and no issues since   Hypertension    followed by pcp   Hypertensive nephropathy    Hypothyroidism    endocrinologist--- dr Lonzo Cloud   IDA (iron deficiency anemia)    Insomnia    Mastodynia of left breast    OAB (overactive bladder)    Osteoporosis    Renal calculus, left    Wears glasses    Wears  partial dentures    lower     Family History  Problem Relation Age of Onset   Cancer Mother        Breast    Hypertension Mother    Cancer Father        Prostate   Hypertension Father    Cancer Sister        Breast   Prostate cancer Brother    Healthy Son      Current Outpatient Medications:    calcium carbonate (TUMS - DOSED IN MG ELEMENTAL CALCIUM) 500 MG chewable tablet, Chew 4 tablets by mouth 2 (two) times daily as needed for indigestion or heartburn., Disp: , Rfl:    cholecalciferol (VITAMIN D3) 25 MCG (1000 UNIT) tablet, Take 1,000 Units by mouth daily., Disp: , Rfl:    COMBIGAN 0.2-0.5 % ophthalmic solution, Place 1 drop into the right eye 2 (two) times daily., Disp: , Rfl: 6   denosumab (PROLIA) 60 MG/ML SOSY injection, Inject 60 mg into the skin every 6 (six) months. Courier to rheum: [address], [address], [place] [address]. Appt 11/17/22, Disp: 1 mL, Rfl: 0   famotidine (PEPCID) 40 MG tablet, Take 40 mg by mouth 2 (two) times daily., Disp: , Rfl:    Ferrous Gluconate (IRON 27 PO), Take 1 tablet by mouth at bedtime., Disp: , Rfl:  Magnesium 250 MG TABS, Take 250 mg by mouth at bedtime., Disp: , Rfl:    nystatin-triamcinolone (MYCOLOG II) cream, SMARTSIG:Topical Morning-Evening, Disp: , Rfl:    simvastatin (ZOCOR) 40 MG tablet, TAKE 1 TABLET BY MOUTH EVERYDAY AT BEDTIME, Disp: 90 tablet, Rfl: 1   SYNTHROID 100 MCG tablet, Take 1 tablet (100 mcg total) by mouth daily before breakfast., Disp: 90 tablet, Rfl: 3   TOLTERODINE TARTRATE PO, Take 2 mg by mouth daily at 12 noon., Disp: , Rfl:    valsartan (DIOVAN) 80 MG tablet, Take 80 mg by mouth daily., Disp: , Rfl:    temazepam (RESTORIL) 15 MG capsule, TAKE 1 CAPSULE BY MOUTH EVERY DAY AT BEDTIME AS NEEDED, Disp: 30 capsule, Rfl: 1   Allergies  Allergen Reactions   Dilantin [Phenytoin Sodium Extended] Rash     Review of Systems  Constitutional:  Positive for fatigue.  Respiratory: Negative.    Cardiovascular:  Negative.   Gastrointestinal:  Positive for diarrhea.       She initially complained of diarrhea. However, after further questioning, she admits she is having only one bowel movement daily. At the most, two per day. She does admit to loose stools. No fever/chills or associated abdominal discomfort.   Neurological: Negative.   Psychiatric/Behavioral: Negative.       Today's Vitals   03/08/23 0957 03/08/23 1016  BP: (!) 160/100 (!) 160/98  Pulse: 83   Temp: 97.9 F (36.6 C)   SpO2: 98%   Weight: 198 lb 9.6 oz (90.1 kg)   Height: 5\' 4"  (1.626 m)    Body mass index is 34.09 kg/m.  Wt Readings from Last 3 Encounters:  03/08/23 198 lb 9.6 oz (90.1 kg)  02/12/23 190 lb (86.2 kg)  01/11/23 190 lb (86.2 kg)     BP Readings from Last 3 Encounters:  03/08/23 (!) 160/98  02/12/23 100/70  01/11/23 (!) 131/90     Objective:  Physical Exam Vitals and nursing note reviewed.  Constitutional:      Appearance: Normal appearance. She is well-developed. She is obese.  HENT:     Head: Normocephalic and atraumatic.  Eyes:     Extraocular Movements: Extraocular movements intact.  Cardiovascular:     Rate and Rhythm: Normal rate. Rhythm irregular.     Heart sounds: Normal heart sounds.  Pulmonary:     Effort: Pulmonary effort is normal.     Breath sounds: Normal breath sounds.  Abdominal:     General: Bowel sounds are normal.     Palpations: Abdomen is soft.     Tenderness: There is no abdominal tenderness.  Musculoskeletal:     Cervical back: Normal range of motion.  Skin:    General: Skin is warm.  Neurological:     General: No focal deficit present.     Mental Status: She is alert.  Psychiatric:        Mood and Affect: Mood normal.        Behavior: Behavior normal.         Assessment And Plan:  Frontal headache Assessment & Plan: Sx started about two weeks ago. Denies change in appetite, sleep habits.  This is likely related to her elevated blood pressures. Repeat BP  taken by myself is 140/90. She agrees to repeat BP once she gets home. She agrees to call the office with the results. She agrees to check BP at home when she is also having a headache. She also has h/o subarachnoid hemorrhage. She agrees  to go to ER should her sx worsen. Since she is also awakening with headaches, may need to consider OSA.    SOB (shortness of breath) Assessment & Plan: EKG performed, NSR w rate variation. No acute changes noted. I will check labs as below. She also admits she has not been exercising recently, sx could also be due to deconditioning.   Orders: -     CMP14+EGFR -     EKG 12-Lead -     Brain natriuretic peptide -     CBC  Hypertensive nephropathy Assessment & Plan: Chronic, uncontrolled. Repeat BP 140/90. Pt is aware that we may need to re-introduce hydrochlorothiazide, possibly with every other day dosing. I did attempt to reach Dr. Malen Gauze; however, I was unsuccessful. She is encouraged to gradually increase daily activity.   Orders: -     CMP14+EGFR -     EKG 12-Lead  Stage 3a chronic kidney disease (HCC) Assessment & Plan: Chronic, she is encouraged to stay well hydrated, avoid NSAIDs and keep BP controlled to prevent progression of CKD.    Orders: -     CMP14+EGFR  Loose stools -     TSH + free T4  Primary insomnia -     Temazepam; TAKE 1 CAPSULE BY MOUTH EVERY DAY AT BEDTIME AS NEEDED  Dispense: 30 capsule; Refill: 1  Class 1 obesity due to excess calories with serious comorbidity and body mass index (BMI) of 34.0 to 34.9 in adult Assessment & Plan: He is encouraged to strive for BMI less than 30 to decrease cardiac risk. Advised to aim for at least 150 minutes of exercise per week.    Drug therapy  She is encouraged to strive for BMI less than 30 to decrease cardiac risk. Advised to aim for at least 150 minutes of exercise per week.    Return if symptoms worsen or fail to improve.  Patient was given opportunity to ask questions.  Patient verbalized understanding of the plan and was able to repeat key elements of the plan. All questions were answered to their satisfaction.    I, Gwynneth Aliment, MD, have reviewed all documentation for this visit. The documentation on 03/08/23 for the exam, diagnosis, procedures, and orders are all accurate and complete.   IF YOU HAVE BEEN REFERRED TO A SPECIALIST, IT MAY TAKE 1-2 WEEKS TO SCHEDULE/PROCESS THE REFERRAL. IF YOU HAVE NOT HEARD FROM US/SPECIALIST IN TWO WEEKS, PLEASE GIVE [place] A CALL AT [phone].   THE PATIENT IS ENCOURAGED TO PRACTICE SOCIAL DISTANCING DUE TO THE COVID-19 PANDEMIC.

## 2023-03-08 NOTE — Assessment & Plan Note (Signed)
He is encouraged to strive for BMI less than 30 to decrease cardiac risk. Advised to aim for at least 150 minutes of exercise per week.  

## 2023-03-08 NOTE — Assessment & Plan Note (Addendum)
Sx started about two weeks ago. Denies change in appetite, sleep habits.  This is likely related to her elevated blood pressures. She agrees to repeat BP once she gets home. She agrees to call the office with the results. She agrees to check BP at home when she is also having a headache. She also has h/o subarachnoid hemorrhage. She agrees to go to ER should her sx worsen. Since she is also awakening with headaches, may need to consider OSA.

## 2023-03-08 NOTE — Assessment & Plan Note (Signed)
Chronic, uncontrolled. Repeat BP 140/90. Pt is aware that we may need to re-introduce hydrochlorothiazide, possibly with every other day dosing. I did attempt to reach Dr. Malen Gauze; however, I was unsuccessful. She is encouraged to gradually increase daily activity.

## 2023-03-09 ENCOUNTER — Other Ambulatory Visit: Payer: Self-pay

## 2023-03-09 LAB — CMP14+EGFR
ALT: 12 [IU]/L (ref 0–32)
AST: 15 [IU]/L (ref 0–40)
Albumin: 4.1 g/dL (ref 3.8–4.8)
Alkaline Phosphatase: 52 [IU]/L (ref 44–121)
BUN/Creatinine Ratio: 15 (ref 12–28)
BUN: 15 mg/dL (ref 8–27)
Bilirubin Total: 0.7 mg/dL (ref 0.0–1.2)
CO2: 21 mmol/L (ref 20–29)
Calcium: 10.2 mg/dL (ref 8.7–10.3)
Chloride: 107 mmol/L — ABNORMAL HIGH (ref 96–106)
Creatinine, Ser: 0.97 mg/dL (ref 0.57–1.00)
Globulin, Total: 3.1 g/dL (ref 1.5–4.5)
Glucose: 86 mg/dL (ref 70–99)
Potassium: 4.3 mmol/L (ref 3.5–5.2)
Sodium: 142 mmol/L (ref 134–144)
Total Protein: 7.2 g/dL (ref 6.0–8.5)
eGFR: 62 mL/min/{1.73_m2} (ref >59)

## 2023-03-09 LAB — CBC
Hematocrit: 46.4 % (ref 34.0–46.6)
Hemoglobin: 15 g/dL (ref 11.1–15.9)
MCH: 32.2 pg (ref 26.6–33.0)
MCHC: 32.3 g/dL (ref 31.5–35.7)
MCV: 100 fL — ABNORMAL HIGH (ref 79–97)
Platelets: 252 10*3/uL (ref 150–450)
RBC: 4.66 x10E6/uL (ref 3.77–5.28)
RDW: 12.6 % (ref 11.7–15.4)
WBC: 5.5 10*3/uL (ref 3.4–10.8)

## 2023-03-09 LAB — BRAIN NATRIURETIC PEPTIDE: BNP: 80.2 pg/mL (ref 0.0–100.0)

## 2023-03-09 LAB — TSH+FREE T4
Free T4: 2.01 ng/dL — ABNORMAL HIGH (ref 0.82–1.77)
TSH: 0.127 u[IU]/mL — ABNORMAL LOW (ref 0.450–4.500)

## 2023-03-09 MED ORDER — HYDROCHLOROTHIAZIDE 12.5 MG PO CAPS: ORAL_CAPSULE | ORAL | 1 refills | Status: DC

## 2023-03-10 ENCOUNTER — Telehealth: Payer: Self-pay | Admitting: Internal Medicine

## 2023-03-10 MED ORDER — SYNTHROID 88 MCG PO TABS: 88.0000 ug | ORAL_TABLET | Freq: Every day | ORAL | 2 refills | Status: DC

## 2023-03-10 NOTE — Telephone Encounter (Signed)
Patient aware and will make medication changes

## 2023-03-10 NOTE — Telephone Encounter (Signed)
Please let the patient know that I received her most recent labs through her PCPs office, and it shows that she is on too much Synthroid   Please asked the patient to stop Synthroid 100 mcg and start Synthroid 88 mcg daily

## 2023-03-10 NOTE — Telephone Encounter (Signed)
-----   Message from Gwynneth Aliment sent at 03/09/2023  3:38 PM EST ----- Your liver/kidney function are stable. Your chloride levels are elevated, please increase your water intake.   Your thyroid is OVERACTIVE, we need to adjust your meds. I will forward to your thyroid specialist as well. Please skip Sat/Sunday dosing until your hear from Dr. Lonzo Cloud.  Your heart test is normal.   Do you drink alcohol? If yes, how many drinks per day? Alcohol intake can aggravate your blood pressure readings. If you need assistance with this, please let me know.   RS

## 2023-03-10 NOTE — Telephone Encounter (Signed)
LMTCB

## 2023-03-31 ENCOUNTER — Other Ambulatory Visit: Payer: Self-pay | Admitting: Internal Medicine

## 2023-04-02 LAB — HM COLONOSCOPY

## 2023-04-05 ENCOUNTER — Encounter: Payer: Self-pay | Admitting: Internal Medicine

## 2023-04-05 ENCOUNTER — Ambulatory Visit: Payer: Medicare HMO | Admitting: Internal Medicine

## 2023-04-05 VITALS — BP 116/80 | HR 84 | Temp 98.5°F | Ht 64.0 in | Wt 197.2 lb

## 2023-04-05 DIAGNOSIS — Z79899 Other long term (current) drug therapy: Secondary | ICD-10-CM

## 2023-04-05 DIAGNOSIS — Z0000 Encounter for general adult medical examination without abnormal findings: Secondary | ICD-10-CM

## 2023-04-05 DIAGNOSIS — N1831 Chronic kidney disease, stage 3a: Secondary | ICD-10-CM

## 2023-04-05 DIAGNOSIS — E039 Hypothyroidism, unspecified: Secondary | ICD-10-CM | POA: Diagnosis not present

## 2023-04-05 DIAGNOSIS — I129 Hypertensive chronic kidney disease with stage 1 through stage 4 chronic kidney disease, or unspecified chronic kidney disease: Secondary | ICD-10-CM | POA: Diagnosis not present

## 2023-04-05 DIAGNOSIS — Z860109 Personal history of other colon polyps: Secondary | ICD-10-CM

## 2023-04-05 DIAGNOSIS — R7309 Other abnormal glucose: Secondary | ICD-10-CM

## 2023-04-05 NOTE — Progress Notes (Signed)
I,[name], CMA,acting as a Neurosurgeon for Gwynneth Aliment, MD.,have documented all relevant documentation on the behalf of Gwynneth Aliment, MD,as directed by  Gwynneth Aliment, MD while in the presence of Gwynneth Aliment, MD.  Subjective:    Patient ID: Rachel [name] , female    DOB: [birth year] , 72 y.o.   MRN: [id number]  Chief Complaint  Patient presents with   Annual Exam   Hypertension    HPI  The patient is here today for a physical exam.  She is followed by GYN for her pelvic exams.   She reports compliance with meds. She denies headaches, chest pain and shortness of breath.  Hypertension This is a chronic problem. The current episode started more than 1 year ago. The problem has been gradually improving since onset. The problem is controlled. Associated symptoms include headaches. Pertinent negatives include no blurred vision. Risk factors for coronary artery disease include dyslipidemia, obesity, post-menopausal state and sedentary lifestyle. The current treatment provides moderate improvement. Identifiable causes of hypertension include a thyroid problem.  Thyroid Problem Presents for follow-up visit. Patient reports no visual change. The symptoms have been stable.     Past Medical History:  Diagnosis Date   CKD (chronic kidney disease), stage III [facility])    nephrologist--- dr l. foster   GERD (gastroesophageal reflux disease)    Glaucoma, right eye    Hiatal hernia    History of gastric ulcer    per egd 2019 non-bleeding   History of spontaneous subarachnoid intracranial hemorrhage due to cerebral aneurysm 03/2004   due to A-com region bilobed aneursym  s/p craniotomy with clipping;  per pt no residual and no issues since   Hypertension    followed by pcp   Hypertensive nephropathy    Hypothyroidism    endocrinologist--- dr Lonzo Cloud   IDA (iron deficiency anemia)    Insomnia    Mastodynia of left breast    OAB (overactive bladder)    Osteoporosis    Renal  calculus, left    Wears glasses    Wears partial dentures    lower     Family History  Problem Relation Age of Onset   Cancer Mother        Breast    Hypertension Mother    Cancer Father        Prostate   Hypertension Father    Cancer Sister        Breast   Prostate cancer Brother    Healthy Son      Current Outpatient Medications:    calcium carbonate (TUMS - DOSED IN MG ELEMENTAL CALCIUM) 500 MG chewable tablet, Chew 4 tablets by mouth 2 (two) times daily as needed for indigestion or heartburn., Disp: , Rfl:    cholecalciferol (VITAMIN D3) 25 MCG (1000 UNIT) tablet, Take 1,000 Units by mouth daily., Disp: , Rfl:    COMBIGAN 0.2-0.5 % ophthalmic solution, Place 1 drop into the right eye 2 (two) times daily., Disp: , Rfl: 6   denosumab (PROLIA) 60 MG/ML SOSY injection, Inject 60 mg into the skin every 6 (six) months. Courier to rheum: [address], [place] [address]. Appt 11/17/22, Disp: 1 mL, Rfl: 0   famotidine (PEPCID) 40 MG tablet, Take 40 mg by mouth 2 (two) times daily., Disp: , Rfl:    Ferrous Gluconate (IRON 27 PO), Take 1 tablet by mouth at bedtime., Disp: , Rfl:    hydrochlorothiazide (MICROZIDE) 12.5 MG capsule,  TAKE 1 CAPSULE BY MOUTH EVERY OTHER DAY, Disp: 45 capsule, Rfl: 1   Magnesium 250 MG TABS, Take 250 mg by mouth at bedtime., Disp: , Rfl:    nystatin-triamcinolone (MYCOLOG II) cream, SMARTSIG:Topical Morning-Evening, Disp: , Rfl:    simvastatin (ZOCOR) 40 MG tablet, TAKE 1 TABLET BY MOUTH EVERYDAY AT BEDTIME, Disp: 90 tablet, Rfl: 1   solifenacin (VESICARE) 10 MG tablet, Take 10 mg by mouth daily., Disp: , Rfl:    SYNTHROID 88 MCG tablet, Take 1 tablet (88 mcg total) by mouth daily before breakfast., Disp: 90 tablet, Rfl: 2   temazepam (RESTORIL) 15 MG capsule, TAKE 1 CAPSULE BY MOUTH EVERY DAY AT BEDTIME AS NEEDED, Disp: 30 capsule, Rfl: 1   valsartan (DIOVAN) 80 MG tablet, Take 80 mg by mouth daily., Disp: , Rfl:    Allergies  Allergen  Reactions   Dilantin [Phenytoin Sodium Extended] Rash      The patient states she uses post menopausal status and rhythm method for birth control. No LMP recorded. Patient is postmenopausal.. Negative for Dysmenorrhea. Negative for: breast discharge, breast lump(s), breast pain and breast self exam. Associated symptoms include abnormal vaginal bleeding. Pertinent negatives include abnormal bleeding (hematology), anxiety, decreased libido, depression, difficulty falling sleep, dyspareunia, history of infertility, nocturia, sexual dysfunction, sleep disturbances, urinary incontinence, urinary urgency, vaginal discharge and vaginal itching. Diet regular.The patient states her exercise level is  moderate - three days per week.   . The patient's tobacco use is:  Social History   Tobacco Use  Smoking Status Former   Current packs/day: 0.00   Average packs/day: 1 pack/day for 32.9 years (32.9 ttl pk-yrs)   Types: Cigarettes   Start date: 25   Quit date: 04/02/2004   Years since quitting: 19.0   Passive exposure: Past  Smokeless Tobacco Never  . She has been exposed to passive smoke. The patient's alcohol use is:  Social History   Substance and Sexual Activity  Alcohol Use No    Review of Systems  Constitutional: Negative.   HENT: Negative.    Eyes: Negative.  Negative for blurred vision.  Respiratory: Negative.    Cardiovascular: Negative.   Gastrointestinal: Negative.   Endocrine: Negative.   Genitourinary: Negative.   Musculoskeletal: Negative.   Skin: Negative.   Allergic/Immunologic: Negative.   Neurological:  Positive for headaches.       She states she was previously having daily headaches.  She stopped taking her Nutrafol, hair vitamins and her sx resolved.   Hematological: Negative.   Psychiatric/Behavioral: Negative.       Today's Vitals   04/05/23 1506  BP: 116/80  Pulse: 84  Temp: 98.5 F (36.9 C)  Weight: 197 lb 3.2 oz (89.4 kg)  Height: 5\' 4"  (1.626 m)   PainSc: 0-No pain   Body mass index is 33.85 kg/m.  Wt Readings from Last 3 Encounters:  04/05/23 197 lb 3.2 oz (89.4 kg)  03/08/23 198 lb 9.6 oz (90.1 kg)  02/12/23 190 lb (86.2 kg)     Objective:  Physical Exam Vitals and nursing note reviewed.  Constitutional:      Appearance: Normal appearance.  HENT:     Head: Normocephalic and atraumatic.     Right Ear: Tympanic membrane, ear canal and external ear normal.     Left Ear: Tympanic membrane, ear canal and external ear normal.     Nose: Nose normal.     Mouth/Throat:     Mouth: Mucous membranes are moist.  Pharynx: Oropharynx is clear.  Eyes:     Extraocular Movements: Extraocular movements intact.     Conjunctiva/sclera: Conjunctivae normal.     Pupils: Pupils are equal, round, and reactive to light.  Cardiovascular:     Rate and Rhythm: Normal rate and regular rhythm.     Pulses: Normal pulses.     Heart sounds: Normal heart sounds.  Pulmonary:     Effort: Pulmonary effort is normal.     Breath sounds: Normal breath sounds.  Chest:  Breasts:    Tanner Score is 5.     Comments: She kept bra on during exam. Breasts not examined Abdominal:     General: Bowel sounds are normal.     Palpations: Abdomen is soft.  Genitourinary:    Comments: deferred Musculoskeletal:        General: Normal range of motion.     Cervical back: Normal range of motion and neck supple.  Skin:    General: Skin is warm and dry.  Neurological:     General: No focal deficit present.     Mental Status: She is alert and oriented to person, place, and time.  Psychiatric:        Mood and Affect: Mood normal.        Behavior: Behavior normal.         Assessment And Plan:     Encounter for annual physical exam Assessment & Plan: A full exam was performed.  Importance of monthly self breast exams was discussed with the patient.  Breast exam not performed today, she kept her bra on. She is followed by Dr. Su Hilt for her GYN. She is  advised to get 3045 minutes of regular exercise, no less than four to five days per week. Both weight-bearing and aerobic exercises are recommended.  She is advised to follow a healthy diet with at least six fruits/veggies per day, decrease intake of red meat and other saturated fats and to increase fish intake to twice weekly.  Meats/fish should not be fried -- baked, boiled or broiled is preferable. It is also important to cut back on your sugar intake.  Be sure to read labels - try to avoid anything with added sugar, high fructose corn syrup or other sweeteners.  If you must use a sweetener, you can try stevia or monkfruit.  It is also important to avoid artificially sweetened foods/beverages and diet drinks. Lastly, wear SPF 50 sunscreen on exposed skin and when in direct sunlight for an extended period of time.  Be sure to avoid fast food restaurants and aim for at least 60 ounces of water daily.       Hypertensive nephropathy Assessment & Plan: Chronic, controlled. EKG performed, NSR w/ rate variation - no acute findings.  She will continue with valsartan 80mg  daily.  She will also c/w hydrochlorothiazide 12.5mg  every other day.  She is encouraged to gradually increase daily activity. She will f/u in four to six months for re-evaluation.   Orders: -     Lipid panel  Stage 3a chronic kidney disease (HCC) Assessment & Plan: Chronic, she is encouraged to stay well hydrated, avoid NSAIDs and keep BP controlled to prevent progression of CKD.    Orders: -     PTH, intact and calcium -     Phosphorus -     Protein electrophoresis, serum  Acquired hypothyroidism -     TSH + free T4; Future  Other abnormal glucose Assessment & Plan: Previous labs reviewed,  her A1c has been elevated in the past. I will check an A1c today. Reminded to avoid refined sugars including sugary drinks/foods and processed meats including bacon, sausages and deli meats.    Orders: -     Hemoglobin A1c  Drug  therapy  Personal history of other colon polyps Assessment & Plan: Last colonoscopy performed 04/02/23 by Dr. Loreta Ave. She is due for her next colonoscopy in 3 years.       Return make RS Jan visit a lab visit, keep AWV, for 1 year physical, 6 month bp. Patient was given opportunity to ask questions. Patient verbalized understanding of the plan and was able to repeat key elements of the plan. All questions were answered to their satisfaction.   I, Gwynneth Aliment, MD, have reviewed all documentation for this visit. The documentation on 04/17/23 for the exam, diagnosis, procedures, and orders are all accurate and complete.

## 2023-04-05 NOTE — Assessment & Plan Note (Signed)
A full exam was performed.  Importance of monthly self breast exams was discussed with the patient.  Breast exam not performed today, she kept her bra on. She is followed by Dr. Su Hilt for her GYN. She is advised to get 3045 minutes of regular exercise, no less than four to five days per week. Both weight-bearing and aerobic exercises are recommended.  She is advised to follow a healthy diet with at least six fruits/veggies per day, decrease intake of red meat and other saturated fats and to increase fish intake to twice weekly.  Meats/fish should not be fried -- baked, boiled or broiled is preferable. It is also important to cut back on your sugar intake.  Be sure to read labels - try to avoid anything with added sugar, high fructose corn syrup or other sweeteners.  If you must use a sweetener, you can try stevia or monkfruit.  It is also important to avoid artificially sweetened foods/beverages and diet drinks. Lastly, wear SPF 50 sunscreen on exposed skin and when in direct sunlight for an extended period of time.  Be sure to avoid fast food restaurants and aim for at least 60 ounces of water daily.

## 2023-04-06 ENCOUNTER — Encounter: Payer: Self-pay | Admitting: Internal Medicine

## 2023-04-09 LAB — PTH, INTACT AND CALCIUM
Calcium: 9.9 mg/dL (ref 8.7–10.3)
PTH: 29 pg/mL (ref 15–65)

## 2023-04-09 LAB — PROTEIN ELECTROPHORESIS, SERUM
A/G Ratio: 1 (ref 0.7–1.7)
Albumin ELP: 3.7 g/dL (ref 2.9–4.4)
Alpha 1: 0.2 g/dL (ref 0.0–0.4)
Alpha 2: 0.9 g/dL (ref 0.4–1.0)
Beta: 1.1 g/dL (ref 0.7–1.3)
Gamma Globulin: 1.6 g/dL (ref 0.4–1.8)
Globulin, Total: 3.8 g/dL (ref 2.2–3.9)
Total Protein: 7.5 g/dL (ref 6.0–8.5)

## 2023-04-09 LAB — LIPID PANEL
Chol/HDL Ratio: 4.2 ratio (ref 0.0–4.4)
Cholesterol, Total: 185 mg/dL (ref 100–199)
HDL: 44 mg/dL
LDL Chol Calc (NIH): 119 mg/dL — ABNORMAL HIGH (ref 0–99)
Triglycerides: 122 mg/dL (ref 0–149)
VLDL Cholesterol Cal: 22 mg/dL (ref 5–40)

## 2023-04-09 LAB — HEMOGLOBIN A1C
Est. average glucose Bld gHb Est-mCnc: 128 mg/dL
Hgb A1c MFr Bld: 6.1 % — ABNORMAL HIGH (ref 4.8–5.6)

## 2023-04-09 LAB — PHOSPHORUS: Phosphorus: 3.6 mg/dL (ref 3.0–4.3)

## 2023-04-17 DIAGNOSIS — Z860109 Personal history of other colon polyps: Secondary | ICD-10-CM | POA: Insufficient documentation

## 2023-04-17 NOTE — Assessment & Plan Note (Signed)
Chronic, controlled. EKG performed, NSR w/ rate variation - no acute findings.  She will continue with valsartan 80mg  daily.  She will also c/w hydrochlorothiazide 12.5mg  every other day.  She is encouraged to gradually increase daily activity. She will f/u in four to six months for re-evaluation.

## 2023-04-17 NOTE — Assessment & Plan Note (Signed)
Previous labs reviewed, her A1c has been elevated in the past. I will check an A1c today. Reminded to avoid refined sugars including sugary drinks/foods and processed meats including bacon, sausages and deli meats.  

## 2023-04-17 NOTE — Assessment & Plan Note (Signed)
 Chronic, she is encouraged to stay well hydrated, avoid NSAIDs and keep BP controlled to prevent progression of CKD.

## 2023-04-17 NOTE — Assessment & Plan Note (Signed)
Last colonoscopy performed 04/02/23 by Dr. Loreta Ave. She is due for her next colonoscopy in 3 years.

## 2023-04-22 ENCOUNTER — Other Ambulatory Visit: Payer: Self-pay

## 2023-04-22 ENCOUNTER — Other Ambulatory Visit (HOSPITAL_COMMUNITY): Payer: Self-pay

## 2023-05-19 NOTE — Progress Notes (Deleted)
 Office Visit Note  Patient: Rachel [name]             Date of Birth: 17-Sep-1950           MRN: [id number]             PCP: Jarold Medici, MD Referring: Jarold Medici, MD Visit Date: 06/01/2023 Occupation: @GUAROCC @  Subjective:  No chief complaint on file.   History of Present Illness: Rachel [name] is a 73 y.o. female ***     Activities of Daily Living:  Patient reports morning stiffness for *** {minute/hour:19697}.   Patient {ACTIONS;DENIES/REPORTS:21021675::Denies} nocturnal pain.  Difficulty dressing/grooming: {ACTIONS;DENIES/REPORTS:21021675::Denies} Difficulty climbing stairs: {ACTIONS;DENIES/REPORTS:21021675::Denies} Difficulty getting out of chair: {ACTIONS;DENIES/REPORTS:21021675::Denies} Difficulty using hands for taps, buttons, cutlery, and/or writing: {ACTIONS;DENIES/REPORTS:21021675::Denies}  No Rheumatology ROS completed.   PMFS History:  Patient Active Problem List   Diagnosis Date Noted  . Personal history of other colon polyps 04/17/2023  . Encounter for annual physical exam 04/05/2023  . Nonintractable headache 03/08/2023  . SOB (shortness of breath) 03/08/2023  . Class 1 obesity due to excess calories with serious comorbidity and body mass index (BMI) of 34.0 to 34.9 in adult 03/08/2023  . Hair thinning 10/05/2022  . Primary insomnia 10/12/2020  . Osteopenia 10/12/2020  . Stage 3a chronic kidney disease (HCC) 10/12/2020  . Acquired hypothyroidism 08/16/2020  . Class 2 severe obesity due to excess calories with serious comorbidity and body mass index (BMI) of 36.0 to 36.9 in adult [facility]) 02/25/2018  . Primary hypothyroidism 02/25/2018  . Other abnormal glucose 02/25/2018  . Hypertensive nephropathy 02/25/2018  . Chronic renal disease, stage II 02/25/2018  . Postinflammatory pulmonary fibrosis (HCC) 11/09/2016  . Morbid obesity due to excess calories (HCC) 12/17/2015  . Small bowel obstruction (HCC) 11/19/2015  . Nausea with  vomiting 11/18/2015  . S/P vaginal hysterectomy 11/12/2015  . Essential hypertension 04/03/2013    Past Medical History:  Diagnosis Date  . CKD (chronic kidney disease), stage III [facility])    nephrologist--- dr l. foster  . GERD (gastroesophageal reflux disease)   . Glaucoma, right eye   . Hiatal hernia   . History of gastric ulcer    per egd 2019 non-bleeding  . History of spontaneous subarachnoid intracranial hemorrhage due to cerebral aneurysm 03/2004   due to A-com region bilobed aneursym  s/p craniotomy with clipping;  per pt no residual and no issues since  . Hypertension    followed by pcp  . Hypertensive nephropathy   . Hypothyroidism    endocrinologist--- dr sam  . IDA (iron deficiency anemia)   . Insomnia   . Mastodynia of left breast   . OAB (overactive bladder)   . Osteoporosis   . Renal calculus, left   . Wears glasses   . Wears partial dentures    lower    Family History  Problem Relation Age of Onset  . Cancer Mother        Breast   . Hypertension Mother   . Cancer Father        Prostate  . Hypertension Father   . Cancer Sister        Breast  . Prostate cancer Brother   . Healthy Son    Past Surgical History:  Procedure Laterality Date  . CEREBRAL ANEURYSM REPAIR  03/2004   @[facility]  by dr lauree   ;   craniotomy w/ clipping to A-com region for bilobed aneurysm (right skull base)  . CLOSED REDUCTION NASAL FRACTURE N/A  06/21/2020   Procedure: CLOSED REDUCTION NASAL FRACTURE;  Surgeon: Llewellyn Gerard LABOR, DO;  Location: [facility] OR;  Service: ENT;  Laterality: N/A;  . COLONOSCOPY WITH ESOPHAGOGASTRODUODENOSCOPY (EGD)  03/16/2018  . CYSTOSCOPY W/ URETERAL STENT PLACEMENT Left 11/13/2021   Procedure: CYSTOSCOPY WITH RETROGRADE PYELOGRAM/URETERAL STENT PLACEMENT;  Surgeon: Selma Donnice SAUNDERS, MD;  Location: [facility];  Service: Urology;  Laterality: Left;  . CYSTOSCOPY/URETEROSCOPY/HOLMIUM LASER/STENT PLACEMENT Left 12/05/2021   Procedure: CYSTOSCOPY/  RETROGRADE/URETEROSCOPY/HOLMIUM LASER/STENT PLACEMENT;  Surgeon: Selma Donnice SAUNDERS, MD;  Location: [facility];  Service: Urology;  Laterality: Left;  . LAPAROSCOPIC BILATERAL SALPINGO OOPHERECTOMY Bilateral 11/12/2015   Procedure: LAPAROSCOPIC BILATERAL SALPINGO OOPHORECTOMY;  Surgeon: Jon Rummer, MD;  Location: [facility];  Service: Gynecology;  Laterality: Bilateral;  . LAPAROSCOPY N/A 11/22/2015   Procedure: LAPAROSCOPY DIAGNOSTIC with repair of serosal tear;  Surgeon: Jon Rummer, MD;  Location: [facility];  Service: Gynecology;  Laterality: N/A;  . RADIOACTIVE SEED GUIDED EXCISIONAL BREAST BIOPSY Left 09/26/2015   Procedure: LEFT RADIOACTIVE SEED GUIDED EXCISIONAL BREAST BIOPSY;  Surgeon: Jina Nephew, MD;  Location: [facility] OR;  Service: General;  Laterality: Left;  . RADIOACTIVE SEED GUIDED EXCISIONAL BREAST BIOPSY Left 02/20/2020   Procedure: LEFT RADIOACTIVE SEED GUIDED EXCISIONAL BREAST BIOPSY;  Surgeon: Nephew Jina, MD;  Location: [facility];  Service: General;  Laterality: Left;  RNFA  . WISDOM TOOTH EXTRACTION     Social History   Social History Narrative  . Not on file   Immunization History  Administered Date(s) Administered  . Fluad Quad(high Dose 65+) 03/06/2020, 03/26/2021, 04/02/2022  . Influenza, High Dose Seasonal PF 02/25/2018, 03/01/2019  . Influenza-Unspecified 01/25/2017  . PFIZER Comirnaty(Gray Top)Covid-19 Tri-Sucrose Vaccine 10/02/2020  . PFIZER(Purple Top)SARS-COV-2 Vaccination 06/03/2019, 06/28/2019, 01/30/2020, 10/02/2020, 09/01/2021  . Pfizer Covid-19 Vaccine Bivalent Booster 75yrs & up 09/01/2021, 06/02/2022  . Pneumococcal Conjugate-13 03/22/2013  . Pneumococcal Polysaccharide-23 03/12/2020  . Respiratory Syncytial Virus Vaccine,Recomb Aduvanted(Arexvy) 06/02/2022  . Rsv, Bivalent, Protein Subunit Rsvpref,pf (Abrysvo) 06/02/2022  . Tdap 04/02/2022  . Zoster Recombinant(Shingrix ) 01/27/2021, 09/01/2021, 01/18/2023      Objective: Vital Signs: There were no vitals taken for this visit.   Physical Exam   Musculoskeletal Exam: ***  CDAI Exam: CDAI Score: -- Patient Global: --; Provider Global: -- Swollen: --; Tender: -- Joint Exam 06/01/2023   No joint exam has been documented for this visit   There is currently no information documented on the homunculus. Go to the Rheumatology activity and complete the homunculus joint exam.  Investigation: No additional findings.  Imaging: No results found.  Recent Labs: Lab Results  Component Value Date   WBC 5.5 03/08/2023   HGB 15.0 03/08/2023   PLT 252 03/08/2023   NA 142 03/08/2023   K 4.3 03/08/2023   CL 107 (H) 03/08/2023   CO2 21 03/08/2023   GLUCOSE 86 03/08/2023   BUN 15 03/08/2023   CREATININE 0.97 03/08/2023   BILITOT 0.7 03/08/2023   ALKPHOS 52 03/08/2023   AST 15 03/08/2023   ALT 12 03/08/2023   PROT 7.5 04/05/2023   ALBUMIN 4.1 03/08/2023   CALCIUM 9.9 04/05/2023   GFRAA 44 (L) 08/29/2019    Speciality Comments: No specialty comments available.  Procedures:  No procedures performed Allergies: Dilantin [phenytoin sodium extended]   Assessment / Plan:     Visit Diagnoses: No diagnosis found.  Orders: No orders of the defined types were placed in this encounter.  No orders of the defined types were placed in this encounter.  Face-to-face time spent with patient was *** minutes. Greater than 50% of time was spent in counseling and coordination of care.  Follow-Up Instructions: No follow-ups on file.   Daved JAYSON Gavel, CMA  Note - This record has been created using Animal nutritionist.  Chart creation errors have been sought, but may not always  have been located. Such creation errors do not reflect on  the standard of medical care.

## 2023-05-26 ENCOUNTER — Encounter: Payer: Self-pay | Admitting: Internal Medicine

## 2023-05-26 ENCOUNTER — Other Ambulatory Visit: Payer: Medicare HMO

## 2023-05-26 ENCOUNTER — Ambulatory Visit: Payer: Medicare HMO

## 2023-05-26 ENCOUNTER — Ambulatory Visit (INDEPENDENT_AMBULATORY_CARE_PROVIDER_SITE_OTHER): Payer: Medicare HMO | Admitting: Internal Medicine

## 2023-05-26 VITALS — BP 106/70 | Temp 98.1°F | Ht 62.0 in | Wt 201.0 lb

## 2023-05-26 VITALS — BP 106/70 | HR 86 | Temp 98.1°F | Ht 62.0 in | Wt 201.0 lb

## 2023-05-26 DIAGNOSIS — I129 Hypertensive chronic kidney disease with stage 1 through stage 4 chronic kidney disease, or unspecified chronic kidney disease: Secondary | ICD-10-CM

## 2023-05-26 DIAGNOSIS — Z0000 Encounter for general adult medical examination without abnormal findings: Secondary | ICD-10-CM | POA: Diagnosis not present

## 2023-05-26 DIAGNOSIS — E039 Hypothyroidism, unspecified: Secondary | ICD-10-CM

## 2023-05-26 DIAGNOSIS — M25551 Pain in right hip: Secondary | ICD-10-CM

## 2023-05-26 DIAGNOSIS — N1831 Chronic kidney disease, stage 3a: Secondary | ICD-10-CM | POA: Diagnosis not present

## 2023-05-26 MED ORDER — HYDROCHLOROTHIAZIDE 12.5 MG PO CAPS: ORAL_CAPSULE | ORAL | 1 refills | Status: DC

## 2023-05-26 NOTE — Progress Notes (Signed)
Subjective:   Rachel [name] is a 73 y.o. female who presents for Medicare Annual (Subsequent) preventive examination.  Visit Complete: In person  Patient Medicare AWV questionnaire was completed by the patient on 05/25/2023; I have confirmed that all information answered by patient is correct and no changes since this date.  Cardiac Risk Factors include: advanced age (>73men, >59 women);hypertension     Objective:    Today's Vitals   05/26/23 1432  BP: 106/70  Pulse: 86  Temp: 98.1 F (36.7 C)  TempSrc: Oral  SpO2: 90%  Weight: 201 lb (91.2 kg)  Height: 5\' 2"  (1.575 m)   Body mass index is 36.76 kg/m.     05/26/2023    2:39 PM 01/11/2023   10:19 AM 05/06/2022    3:56 PM 12/05/2021   12:00 PM 04/09/2021    2:09 PM 06/21/2020    7:28 AM 06/13/2020    4:37 PM  Advanced Directives  Does Patient Have a Medical Advance Directive? No No No No No No No  Does patient want to make changes to medical advance directive?       No - Patient declined  Would patient like information on creating a medical advance directive? No - Patient declined   Yes (Inpatient - patient defers creating a medical advance directive at this time - Information given)  No - Patient declined No - Patient declined    Current Medications (verified) Outpatient Encounter Medications as of 05/26/2023  Medication Sig   calcium carbonate (TUMS - DOSED IN MG ELEMENTAL CALCIUM) 500 MG chewable tablet Chew 4 tablets by mouth 2 (two) times daily as needed for indigestion or heartburn.   cholecalciferol (VITAMIN D3) 25 MCG (1000 UNIT) tablet Take 1,000 Units by mouth daily.   COMBIGAN 0.2-0.5 % ophthalmic solution Place 1 drop into the right eye 2 (two) times daily.   denosumab (PROLIA) 60 MG/ML SOSY injection Inject 60 mg into the skin every 6 (six) months. Courier to rheum: [address], [place] [address]. Appt 11/17/22   famotidine (PEPCID) 40 MG tablet Take 40 mg by mouth 2 (two) times daily.    Ferrous Gluconate (IRON 27 PO) Take 1 tablet by mouth at bedtime.   hydrochlorothiazide (MICROZIDE) 12.5 MG capsule TAKE 1 CAPSULE BY MOUTH EVERY OTHER DAY   Magnesium 250 MG TABS Take 250 mg by mouth at bedtime.   nystatin-triamcinolone (MYCOLOG II) cream SMARTSIG:Topical Morning-Evening   simvastatin (ZOCOR) 40 MG tablet TAKE 1 TABLET BY MOUTH EVERYDAY AT BEDTIME   solifenacin (VESICARE) 10 MG tablet Take 10 mg by mouth daily.   SYNTHROID 88 MCG tablet Take 1 tablet (88 mcg total) by mouth daily before breakfast.   temazepam (RESTORIL) 15 MG capsule TAKE 1 CAPSULE BY MOUTH EVERY DAY AT BEDTIME AS NEEDED   valsartan (DIOVAN) 80 MG tablet Take 80 mg by mouth daily.   No facility-administered encounter medications on file as of 05/26/2023.    Allergies (verified) Dilantin [phenytoin sodium extended]   History: Past Medical History:  Diagnosis Date   Allergy 04/12/2004   CKD (chronic kidney disease), stage III [facility])    nephrologist--- dr l. foster   GERD (gastroesophageal reflux disease)    Glaucoma, right eye    Hiatal hernia    History of gastric ulcer    per egd 2019 non-bleeding   History of spontaneous subarachnoid intracranial hemorrhage due to cerebral aneurysm 03/2004   due to A-com region bilobed aneursym  s/p craniotomy with clipping;  per pt no residual and no issues since   Hypertension    followed by pcp   Hypertensive nephropathy    Hypothyroidism    endocrinologist--- dr Lonzo Cloud   IDA (iron deficiency anemia)    Insomnia    Mastodynia of left breast    OAB (overactive bladder)    Osteoporosis    Renal calculus, left    Wears glasses    Wears partial dentures    lower   Past Surgical History:  Procedure Laterality Date   CEREBRAL ANEURYSM REPAIR  03/2004   @[facility]  by dr Cephus Slater   ;   craniotomy w/ clipping to A-com region for bilobed aneurysm (right skull base)   CLOSED REDUCTION NASAL FRACTURE N/A 06/21/2020   Procedure: CLOSED REDUCTION NASAL FRACTURE;   Surgeon: Laren Boom, DO;  Location: [facility] OR;  Service: ENT;  Laterality: N/A;   COLONOSCOPY WITH ESOPHAGOGASTRODUODENOSCOPY (EGD)  03/16/2018   CYSTOSCOPY W/ URETERAL STENT PLACEMENT Left 11/13/2021   Procedure: CYSTOSCOPY WITH RETROGRADE PYELOGRAM/URETERAL STENT PLACEMENT;  Surgeon: Jannifer Hick, MD;  Location: [facility];  Service: Urology;  Laterality: Left;   CYSTOSCOPY/URETEROSCOPY/HOLMIUM LASER/STENT PLACEMENT Left 12/05/2021   Procedure: CYSTOSCOPY/ RETROGRADE/URETEROSCOPY/HOLMIUM LASER/STENT PLACEMENT;  Surgeon: Jannifer Hick, MD;  Location: [facility];  Service: Urology;  Laterality: Left;   LAPAROSCOPIC BILATERAL SALPINGO OOPHERECTOMY Bilateral 11/12/2015   Procedure: LAPAROSCOPIC BILATERAL SALPINGO OOPHORECTOMY;  Surgeon: Osborn Coho, MD;  Location: [facility];  Service: Gynecology;  Laterality: Bilateral;   LAPAROSCOPY N/A 11/22/2015   Procedure: LAPAROSCOPY DIAGNOSTIC with repair of serosal tear;  Surgeon: Osborn Coho, MD;  Location: [facility];  Service: Gynecology;  Laterality: N/A;   RADIOACTIVE SEED GUIDED EXCISIONAL BREAST BIOPSY Left 09/26/2015   Procedure: LEFT RADIOACTIVE SEED GUIDED EXCISIONAL BREAST BIOPSY;  Surgeon: Almond Lint, MD;  Location: [facility] OR;  Service: General;  Laterality: Left;   RADIOACTIVE SEED GUIDED EXCISIONAL BREAST BIOPSY Left 02/20/2020   Procedure: LEFT RADIOACTIVE SEED GUIDED EXCISIONAL BREAST BIOPSY;  Surgeon: Almond Lint, MD;  Location: [facility];  Service: General;  Laterality: Left;  RNFA   WISDOM TOOTH EXTRACTION     Family History  Problem Relation Age of Onset   Cancer Mother        Breast    Hypertension Mother    Cancer Father        Prostate   Hypertension Father    Cancer Sister        Breast   COPD Sister    Prostate cancer Brother    Healthy Son    Social History   Socioeconomic History   Marital status: Married    Spouse name: Not on file   Number of children: Not on file   Years of  education: Not on file   Highest education level: Associate degree: occupational, Scientist, product/process development, or vocational program  Occupational History   Occupation: retired  Tobacco Use   Smoking status: Former    Current packs/day: 0.00    Average packs/day: 1 pack/day for 32.9 years (32.9 ttl pk-yrs)    Types: Cigarettes    Start date: 57    Quit date: 04/02/2004    Years since quitting: 19.1    Passive exposure: Past   Smokeless tobacco: Never  Vaping Use   Vaping status: Never Used  Substance and Sexual Activity   Alcohol use: No   Drug use: No   Sexual activity: Not Currently    Birth control/protection: Post-menopausal  Other Topics Concern   Not  on file  Social History Narrative   Not on file   Social Drivers of Health   Financial Resource Strain: Low Risk  (05/26/2023)   Overall Financial Resource Strain (CARDIA)    Difficulty of Paying Living Expenses: Not hard at all  Food Insecurity: No Food Insecurity (05/26/2023)   Hunger Vital Sign    Worried About Running Out of Food in the Last Year: Never true    Ran Out of Food in the Last Year: Never true  Transportation Needs: No Transportation Needs (05/26/2023)   PRAPARE - Administrator, Civil Service (Medical): No    Lack of Transportation (Non-Medical): No  Physical Activity: Sufficiently Active (05/26/2023)   Exercise Vital Sign    Days of Exercise per Week: 5 days    Minutes of Exercise per Session: 30 min  Stress: No Stress Concern Present (05/26/2023)   [employer] of Occupational Health - Occupational Stress Questionnaire    Feeling of Stress : Not at all  Social Connections: Moderately Isolated (05/26/2023)   Social Connection and Isolation Panel [NHANES]    Frequency of Communication with Friends and Family: Twice a week    Frequency of Social Gatherings with Friends and Family: Once a week    Attends Religious Services: Never    Database administrator or Organizations: No    Attends Museum/gallery exhibitions officer: Never    Marital Status: Married    Tobacco Counseling Counseling given: Not Answered   Clinical Intake:  Pre-visit preparation completed: Yes  Pain : No/denies pain     Nutritional Status: BMI > 30  Obese Nutritional Risks: None Diabetes: No  How often do you need to have someone help you when you read instructions, pamphlets, or other written materials from your doctor or pharmacy?: 1 - Never  Interpreter Needed?: No  Information entered by :: NAllen LPN   Activities of Daily Living    05/25/2023   11:55 PM  In your present state of health, do you have any difficulty performing the following activities:  Hearing? 0  Vision? 0  Difficulty concentrating or making decisions? 0  Walking or climbing stairs? 0  Dressing or bathing? 0  Doing errands, shopping? 0  Preparing Food and eating ? N  Using the Toilet? N  In the past six months, have you accidently leaked urine? N  Do you have problems with loss of bowel control? N  Managing your Medications? N  Managing your Finances? N  Housekeeping or managing your Housekeeping? N    Patient Care Team: Dorothyann Peng, MD as PCP - General (Internal Medicine) Chalmers Guest, MD as Consulting Physician (Ophthalmology)  Indicate any recent Medical Services you may have received from other than [facility] providers in the past year (date may be approximate).     Assessment:   This is a routine wellness examination for Junell.  Hearing/Vision screen Hearing Screening - Comments:: Denies hearing issues Vision Screening - Comments:: Regular eye exams, Dr. Harlon Flor   Goals Addressed             This Visit's Progress    Patient Stated       05/26/2023, eat healthier and get off some medications       Depression Screen    05/26/2023    2:40 PM 03/08/2023    9:56 AM 10/05/2022    2:23 PM 05/06/2022    3:56 PM 04/09/2021    2:12 PM 03/26/2021   10:59 AM 03/06/2020  11:07 AM  PHQ 2/9 Scores   PHQ - 2 Score 0 0 0 0 0 0 0  PHQ- 9 Score  0 0   0     Fall Risk    05/25/2023   11:55 PM 03/08/2023    9:56 AM 10/05/2022    2:22 PM 05/06/2022    3:56 PM 04/09/2021    2:10 PM  Fall Risk   Falls in the past year? 0 0 0 0 1  Comment     fell on uneven pavement  Number falls in past yr: 0 0 0 0 0  Injury with Fall? 0 0 0 0 1  Comment     broke nose  Risk for fall due to : Medication side effect No Fall Risks No Fall Risks Medication side effect Medication side effect  Follow up Falls prevention discussed;Falls evaluation completed Falls evaluation completed Falls evaluation completed Falls prevention discussed;Education provided;Falls evaluation completed Falls evaluation completed;Education provided;Falls prevention discussed    MEDICARE RISK AT HOME: Medicare Risk at Home Any stairs in or around the home?: (Patient-Rptd) No Home free of loose throw rugs in walkways, pet beds, electrical cords, etc?: (Patient-Rptd) Yes Adequate lighting in your home to reduce risk of falls?: (Patient-Rptd) Yes Life alert?: (Patient-Rptd) No Use of a cane, walker or w/c?: (Patient-Rptd) No Grab bars in the bathroom?: (Patient-Rptd) No Shower chair or bench in shower?: (Patient-Rptd) No Elevated toilet seat or a handicapped toilet?: (Patient-Rptd) Yes  TIMED UP AND GO:  Was the test performed?  Yes  Length of time to ambulate 10 feet: 5 sec Gait steady and fast without use of assistive device    Cognitive Function:        05/26/2023    2:40 PM 05/06/2022    3:57 PM 04/09/2021    2:13 PM 03/06/2020   11:08 AM 03/01/2019   12:00 PM  6CIT Screen  What Year? 0 points 0 points 0 points 0 points 0 points  What month? 0 points 0 points 0 points 0 points 0 points  What time? 0 points 3 points 0 points 0 points 0 points  Count back from 20 0 points 0 points 0 points 0 points 0 points  Months in reverse 0 points 2 points 0 points 2 points 0 points  Repeat phrase 2 points 0 points 0 points 2  points 4 points  Total Score 2 points 5 points 0 points 4 points 4 points    Immunizations Immunization History  Administered Date(s) Administered   Fluad Quad(high Dose 65+) 03/06/2020, 03/26/2021, 04/02/2022   Influenza, High Dose Seasonal PF 02/25/2018, 03/01/2019   Influenza-Unspecified 01/25/2017   PFIZER Comirnaty(Gray Top)Covid-19 Tri-Sucrose Vaccine 10/02/2020   PFIZER(Purple Top)SARS-COV-2 Vaccination 06/03/2019, 06/28/2019, 01/30/2020, 10/02/2020, 09/01/2021   Pfizer Covid-19 Vaccine Bivalent Booster 58yrs & up 09/01/2021, 06/02/2022   Pneumococcal Conjugate-13 03/22/2013   Pneumococcal Polysaccharide-23 03/12/2020   Respiratory Syncytial Virus Vaccine,Recomb Aduvanted(Arexvy) 06/02/2022   Rsv, Bivalent, Protein Subunit Rsvpref,pf Verdis Frederickson) 06/02/2022   Tdap 04/02/2022   Zoster Recombinant(Shingrix) 01/27/2021, 09/01/2021, 01/18/2023    TDAP status: Up to date  Flu Vaccine status: Up to date  Pneumococcal vaccine status: Up to date  Covid-19 vaccine status: Completed vaccines  Qualifies for Shingles Vaccine? Yes   Zostavax completed Yes   Shingrix Completed?: Yes  Screening Tests Health Maintenance  Topic Date Due   INFLUENZA VACCINE  11/26/2022   COVID-19 Vaccine (8 - 2024-25 season) 12/27/2022   Cervical Cancer Screening (Pap smear)  03/25/2023  MAMMOGRAM  12/08/2023   Medicare Annual Wellness (AWV)  05/25/2024   Colonoscopy  04/01/2026   DTaP/Tdap/Td (2 - Td or Tdap) 04/02/2032   Pneumonia Vaccine 84+ Years old  Completed   DEXA SCAN  Completed   Hepatitis C Screening  Completed   Zoster Vaccines- Shingrix  Completed   HPV VACCINES  Aged Out    Health Maintenance  Health Maintenance Due  Topic Date Due   INFLUENZA VACCINE  11/26/2022   COVID-19 Vaccine (8 - 2024-25 season) 12/27/2022   Cervical Cancer Screening (Pap smear)  03/25/2023    Colorectal cancer screening: Type of screening: Colonoscopy. Completed 04/02/2023. Repeat every 3  years  Mammogram status: Completed 12/08/2022. Repeat every year  Bone Density status: Completed 04/23/2022.   Lung Cancer Screening: (Low Dose CT Chest recommended if Age 68-80 years, 20 pack-year currently smoking OR have quit w/in 15years.) does not qualify.   Lung Cancer Screening Referral: no  Additional Screening:  Hepatitis C Screening: does qualify; Completed 02/25/2018  Vision Screening: Recommended annual ophthalmology exams for early detection of glaucoma and other disorders of the eye. Is the patient up to date with their annual eye exam?  Yes  Who is the provider or what is the name of the office in which the patient attends annual eye exams? Dr. Harlon Flor If pt is not established with a provider, would they like to be referred to a provider to establish care? No .   Dental Screening: Recommended annual dental exams for proper oral hygiene  Diabetic Foot Exam: n/a  Community Resource Referral / Chronic Care Management: CRR required this visit?  No   CCM required this visit?  No     Plan:     I have personally reviewed and noted the following in the patient's chart:   Medical and social history Use of alcohol, tobacco or illicit drugs  Current medications and supplements including opioid prescriptions. Patient is not currently taking opioid prescriptions. Functional ability and status Nutritional status Physical activity Advanced directives List of other physicians Hospitalizations, surgeries, and ER visits in previous 12 months Vitals Screenings to include cognitive, depression, and falls Referrals and appointments  In addition, I have reviewed and discussed with patient certain preventive protocols, quality metrics, and best practice recommendations. A written personalized care plan for preventive services as well as general preventive health recommendations were provided to patient.     Barb Merino, LPN   6/44/0347   After Visit Summary: (In  Person-Printed) AVS printed and given to the patient  Nurse Notes: none

## 2023-05-26 NOTE — Patient Instructions (Signed)
Rachel [name] , Thank you for taking time to come for your Medicare Wellness Visit. I appreciate your ongoing commitment to your health goals. Please review the following plan we discussed and let me know if I can assist you in the future.   Referrals/Orders/Follow-Ups/Clinician Recommendations: none  This is a list of the screening recommended for you and due dates:  Health Maintenance  Topic Date Due   COVID-19 Vaccine (9 - 2024-25 season) 03/03/2023   Pap Smear  03/25/2023   Mammogram  12/08/2023   Medicare Annual Wellness Visit  05/25/2024   Colon Cancer Screening  04/01/2026   DTaP/Tdap/Td vaccine (2 - Td or Tdap) 04/02/2032   Pneumonia Vaccine  Completed   Flu Shot  Completed   DEXA scan (bone density measurement)  Completed   Hepatitis C Screening  Completed   Zoster (Shingles) Vaccine  Completed   HPV Vaccine  Aged Out    Advanced directives: (Provided) Advance directive discussed with you today. I have provided a copy for you to complete at home and have notarized. Once this is complete, please bring a copy in to our office so we can scan it into your chart.   Next Medicare Annual Wellness Visit scheduled for next year: Yes  insert Preventive Care attachment Insert FALL PREVENTION attachment if needed

## 2023-05-26 NOTE — Progress Notes (Signed)
I,Victoria T Deloria Lair, CMA,acting as a Neurosurgeon for Gwynneth Aliment, MD.,have documented all relevant documentation on the behalf of Gwynneth Aliment, MD,as directed by  Gwynneth Aliment, MD while in the presence of Gwynneth Aliment, MD.  Subjective:  Patient ID: Rachel [name] , female    DOB: [birth year] , 73 y.o.   MRN: [id number]  Chief Complaint  Patient presents with   Hypertension   Hypothyroidism    HPI  The patient is here today for a blood pressure and thyroid f/u.  She reports compliance with meds. She denies headaches, chest pain and shortness of breath.   AWV completed with [facility] Advisor, [place].   Hypertension This is a chronic problem. The current episode started more than 1 year ago. The problem has been gradually improving since onset. The problem is controlled. Pertinent negatives include no blurred vision, chest pain or shortness of breath. Risk factors for coronary artery disease include dyslipidemia, obesity, post-menopausal state and sedentary lifestyle. The current treatment provides moderate improvement.     Past Medical History:  Diagnosis Date   Allergy 04/12/2004   CKD (chronic kidney disease), stage III [facility])    nephrologist--- dr l. foster   GERD (gastroesophageal reflux disease)    Glaucoma, right eye    Hiatal hernia    History of gastric ulcer    per egd 2019 non-bleeding   History of spontaneous subarachnoid intracranial hemorrhage due to cerebral aneurysm 03/2004   due to A-com region bilobed aneursym  s/p craniotomy with clipping;  per pt no residual and no issues since   Hypertension    followed by pcp   Hypertensive nephropathy    Hypothyroidism    endocrinologist--- dr Lonzo Cloud   IDA (iron deficiency anemia)    Insomnia    Mastodynia of left breast    OAB (overactive bladder)    Osteoporosis    Renal calculus, left    Wears glasses    Wears partial dentures    lower     Family History  Problem Relation Age of Onset   Cancer Mother         Breast    Hypertension Mother    Cancer Father        Prostate   Hypertension Father    Cancer Sister        Breast   COPD Sister    Prostate cancer Brother    Healthy Son      Current Outpatient Medications:    calcium carbonate (TUMS - DOSED IN MG ELEMENTAL CALCIUM) 500 MG chewable tablet, Chew 4 tablets by mouth 2 (two) times daily as needed for indigestion or heartburn., Disp: , Rfl:    cholecalciferol (VITAMIN D3) 25 MCG (1000 UNIT) tablet, Take 1,000 Units by mouth daily., Disp: , Rfl:    COMBIGAN 0.2-0.5 % ophthalmic solution, Place 1 drop into the right eye 2 (two) times daily., Disp: , Rfl: 6   denosumab (PROLIA) 60 MG/ML SOSY injection, Inject 60 mg into the skin every 6 (six) months. Courier to rheum: [address], [address], [place] [address]. Appt 11/17/22, Disp: 1 mL, Rfl: 0   famotidine (PEPCID) 40 MG tablet, Take 40 mg by mouth 2 (two) times daily., Disp: , Rfl:    Ferrous Gluconate (IRON 27 PO), Take 1 tablet by mouth at bedtime., Disp: , Rfl:    hydrochlorothiazide (MICROZIDE) 12.5 MG capsule, TAKE 1 CAPSULE BY MOUTH EVERY OTHER DAY, Disp: 45 capsule, Rfl: 1   Magnesium  250 MG TABS, Take 250 mg by mouth at bedtime., Disp: , Rfl:    nystatin-triamcinolone (MYCOLOG II) cream, SMARTSIG:Topical Morning-Evening, Disp: , Rfl:    simvastatin (ZOCOR) 40 MG tablet, TAKE 1 TABLET BY MOUTH EVERYDAY AT BEDTIME, Disp: 90 tablet, Rfl: 1   solifenacin (VESICARE) 10 MG tablet, Take 10 mg by mouth daily., Disp: , Rfl:    SYNTHROID 88 MCG tablet, Take 1 tablet (88 mcg total) by mouth daily before breakfast., Disp: 90 tablet, Rfl: 2   temazepam (RESTORIL) 15 MG capsule, TAKE 1 CAPSULE BY MOUTH EVERY DAY AT BEDTIME AS NEEDED, Disp: 30 capsule, Rfl: 1   valsartan (DIOVAN) 80 MG tablet, Take 80 mg by mouth daily., Disp: , Rfl:    Allergies  Allergen Reactions   Dilantin [Phenytoin Sodium Extended] Rash     Review of Systems  Constitutional: Negative.   Eyes:  Negative for  blurred vision.  Respiratory: Negative.  Negative for shortness of breath.   Cardiovascular: Negative.  Negative for chest pain.  Gastrointestinal: Negative.   Musculoskeletal:  Positive for arthralgias.       She c/o worsening right hip pain. Denies fall/trauma. Sx exacerbated by being seated for long period of time. She has tried Tylenol without relief of her sx.   Neurological: Negative.   Psychiatric/Behavioral: Negative.       Today's Vitals   05/26/23 1448  BP: 106/70  Temp: 98.1 F (36.7 C)  SpO2: 98%  Weight: 201 lb (91.2 kg)  Height: 5\' 2"  (1.575 m)   Body mass index is 36.76 kg/m.  Wt Readings from Last 3 Encounters:  05/26/23 201 lb (91.2 kg)  05/26/23 201 lb (91.2 kg)  04/05/23 197 lb 3.2 oz (89.4 kg)     Objective:  Physical Exam Vitals and nursing note reviewed.  Constitutional:      Appearance: Normal appearance. She is obese.  HENT:     Head: Normocephalic and atraumatic.  Eyes:     Extraocular Movements: Extraocular movements intact.  Cardiovascular:     Rate and Rhythm: Normal rate and regular rhythm.     Heart sounds: Normal heart sounds.  Pulmonary:     Effort: Pulmonary effort is normal.     Breath sounds: Normal breath sounds.  Musculoskeletal:     Cervical back: Normal range of motion.  Skin:    General: Skin is warm.  Neurological:     General: No focal deficit present.     Mental Status: She is alert.  Psychiatric:        Mood and Affect: Mood normal.        Behavior: Behavior normal.         Assessment And Plan:  Hypertensive nephropathy Assessment & Plan: Chronic, controlled.  She will continue with valsartan 80mg  daily.  She will also c/w hydrochlorothiazide 12.5mg  every other day.  She is encouraged to gradually increase daily activity. She will f/u in four to six months for re-evaluation.    Stage 3a chronic kidney disease (HCC) Assessment & Plan: Chronic, she is encouraged to stay well hydrated, avoid NSAIDs and keep BP  controlled to prevent progression of CKD.     Right hip pain Assessment & Plan: Her sx are conflicting. Initially described as dull pain. Also c/o sharp, burning pain which is suggestive of meralgia paresthetica.  She is also advised to use lidocaine patch prn.  However, due to discomfort occurring after being seated, I will refer her to Ortho for radiographic changes.  Orders: -     Ambulatory referral to Orthopedic Surgery  Acquired hypothyroidism Assessment & Plan: Chronic, also followed by Endo.  I will check labs as below and forward to Endo.  Orders: -     TSH + free T4  Other orders -     hydroCHLOROthiazide; TAKE 1 CAPSULE BY MOUTH EVERY OTHER DAY  Dispense: 45 capsule; Refill: 1  She is encouraged to strive for BMI less than 30 to decrease cardiac risk. Advised to aim for at least 150 minutes of exercise per week.    Return if symptoms worsen or fail to improve.  Patient was given opportunity to ask questions. Patient verbalized understanding of the plan and was able to repeat key elements of the plan. All questions were answered to their satisfaction.   I, Gwynneth Aliment, MD, have reviewed all documentation for this visit. The documentation on 05/26/23 for the exam, diagnosis, procedures, and orders are all accurate and complete.   IF YOU HAVE BEEN REFERRED TO A SPECIALIST, IT MAY TAKE 1-2 WEEKS TO SCHEDULE/PROCESS THE REFERRAL. IF YOU HAVE NOT HEARD FROM US/SPECIALIST IN TWO WEEKS, PLEASE GIVE [place] A CALL AT [phone] X [phone].   THE PATIENT IS ENCOURAGED TO PRACTICE SOCIAL DISTANCING DUE TO THE COVID-19 PANDEMIC.

## 2023-05-26 NOTE — Patient Instructions (Addendum)
Meralgia paresthetica is a condition where the lateral femoral cutaneous nerve, which provides sensation to the outer thigh, becomes compressed or irritated. It can cause discomfort in the thigh area, and its symptoms can vary. Here's an overview of the symptoms and some home therapies you can try:  Symptoms: Numbness or tingling: This is the most common symptom and usually occurs on the outer thigh. Burning pain: Some people experience a burning sensation, typically along the outer side of the thigh. Hypersensitivity: The affected area may become very sensitive to touch or pressure. Pain when standing or walking: Prolonged standing or walking can sometimes worsen the pain. Pain with certain movements: Activities that cause stretching or compression of the hip area may make the pain worse. Home Therapies: Rest: Avoid prolonged standing or walking, which may aggravate the symptoms. Giving your nerve a chance to rest can help reduce irritation.  Ice or Heat Therapy:  Ice packs: Applying an ice pack to the affected area for 15-20 minutes at a time can help reduce inflammation and numb the pain. Heat therapy: After the initial inflammation is reduced, you might try applying a warm compress or using a heating pad to promote blood circulation and relax the muscles. Stretching and Strengthening Exercises:  Stretching your hip flexors and hamstrings can help reduce pressure on the nerve. Strengthening the muscles around your hips and lower back can help support the area and reduce the risk of further nerve compression. Posture and Body Mechanics:  Avoid wearing tight clothing, especially around the waist or hips, as it can increase compression on the nerve. Be mindful of how you sit or stand. Try to avoid sitting with your legs crossed or leaning to one side for long periods. Over-the-counter pain relievers:  Nonsteroidal anti-inflammatory drugs (NSAIDs) like ibuprofen or naproxen can help reduce  pain and inflammation, but be sure to follow the dosage instructions. Weight management: If excess weight is contributing to the pressure on the nerve, losing weight gradually can sometimes help alleviate symptoms.  Massage: Gentle massage or using a foam roller on the outer thigh may help relieve muscle tension and discomfort around the affected nerve.

## 2023-05-27 ENCOUNTER — Telehealth: Payer: Self-pay | Admitting: Pharmacist

## 2023-05-27 ENCOUNTER — Other Ambulatory Visit (HOSPITAL_COMMUNITY): Payer: Self-pay

## 2023-05-27 DIAGNOSIS — N1831 Chronic kidney disease, stage 3a: Secondary | ICD-10-CM

## 2023-05-27 DIAGNOSIS — M8589 Other specified disorders of bone density and structure, multiple sites: Secondary | ICD-10-CM

## 2023-05-27 DIAGNOSIS — Z5181 Encounter for therapeutic drug level monitoring: Secondary | ICD-10-CM

## 2023-05-27 LAB — TSH+FREE T4
Free T4: 1.62 ng/dL (ref 0.82–1.77)
TSH: 0.2 u[IU]/mL — ABNORMAL LOW (ref 0.450–4.500)

## 2023-05-27 NOTE — Progress Notes (Signed)
 Office Visit Note  Patient: Rachel [name]             Date of Birth: 04/12/1951           MRN: [id number]             PCP: Jarold Medici, MD Referring: Jarold Medici, MD Visit Date: 06/03/2023 Occupation: @GUAROCC @  Subjective:  Medication management  History of Present Illness: Rachel [name] is a 73 y.o. female with osteopenia.  Patient received her last Prolia  injection in August 2024.  She will be getting Prolia  injection this month.  She denies any side effects from Prolia .  She was started on Prolia  by Dr. Jarold most likely in August 2017.  She has been on Prolia  since then and received first Prolia  injection in August 2024 through our office.  She has been taking calcium and vitamin D  and decide any side effects from Prolia .  She states recently she has been having some discomfort in her right hip and has an appointment coming up with Dr. Jerri which is scheduled.    Activities of Daily Living:  Patient reports morning stiffness for 0 minutes.   Patient Reports nocturnal pain.  Difficulty dressing/grooming: Denies Difficulty climbing stairs: Denies Difficulty getting out of chair: Denies Difficulty using hands for taps, buttons, cutlery, and/or writing: Denies  Review of Systems  Constitutional:  Negative for fatigue.  HENT:  Negative for mouth sores and mouth dryness.   Eyes:  Negative for pain and dryness.  Respiratory:  Negative for shortness of breath and difficulty breathing.   Cardiovascular:  Negative for chest pain and palpitations.  Gastrointestinal:  Negative for blood in stool, constipation and diarrhea.  Endocrine: Negative for increased urination.  Genitourinary:  Negative for involuntary urination.  Musculoskeletal:  Positive for joint pain and joint pain. Negative for gait problem, joint swelling, myalgias, muscle weakness, morning stiffness, muscle tenderness and myalgias.  Skin:  Negative for color change, rash, hair loss and sensitivity to sunlight.   Allergic/Immunologic: Negative for susceptible to infections.  Neurological:  Negative for dizziness and headaches.  Hematological:  Negative for swollen glands.  Psychiatric/Behavioral:  Positive for sleep disturbance. Negative for depressed mood. The patient is not nervous/anxious.     PMFS History:  Patient Active Problem List   Diagnosis Date Noted   Right hip pain 05/29/2023   Personal history of other colon polyps 04/17/2023   Encounter for annual physical exam 04/05/2023   Nonintractable headache 03/08/2023   SOB (shortness of breath) 03/08/2023   Class 1 obesity due to excess calories with serious comorbidity and body mass index (BMI) of 34.0 to 34.9 in adult 03/08/2023   Hair thinning 10/05/2022   Primary insomnia 10/12/2020   Osteopenia 10/12/2020   Stage 3a chronic kidney disease (HCC) 10/12/2020   Acquired hypothyroidism 08/16/2020   Class 2 severe obesity due to excess calories with serious comorbidity and body mass index (BMI) of 36.0 to 36.9 in adult (HCC) 02/25/2018   Primary hypothyroidism 02/25/2018   Other abnormal glucose 02/25/2018   Hypertensive nephropathy 02/25/2018   Chronic renal disease, stage II 02/25/2018   Postinflammatory pulmonary fibrosis (HCC) 11/09/2016   Morbid obesity due to excess calories (HCC) 12/17/2015   Small bowel obstruction (HCC) 11/19/2015   Nausea with vomiting 11/18/2015   S/P vaginal hysterectomy 11/12/2015   Essential hypertension 04/03/2013    Past Medical History:  Diagnosis Date   Allergy 04/12/2004   CKD (chronic kidney disease), stage III (HCC)  nephrologist--- dr l. foster   GERD (gastroesophageal reflux disease)    Glaucoma, right eye    Hiatal hernia    History of gastric ulcer    per egd 2019 non-bleeding   History of spontaneous subarachnoid intracranial hemorrhage due to cerebral aneurysm 03/2004   due to A-com region bilobed aneursym  s/p craniotomy with clipping;  per pt no residual and no issues since    Hypertension    followed by pcp   Hypertensive nephropathy    Hypothyroidism    endocrinologist--- dr sam   IDA (iron deficiency anemia)    Insomnia    Mastodynia of left breast    OAB (overactive bladder)    Osteoporosis    Renal calculus, left    Wears glasses    Wears partial dentures    lower    Family History  Problem Relation Age of Onset   Cancer Mother        Breast    Hypertension Mother    Cancer Father        Prostate   Hypertension Father    Cancer Sister        Breast   COPD Sister    Prostate cancer Brother    Healthy Son    Past Surgical History:  Procedure Laterality Date   CEREBRAL ANEURYSM REPAIR  03/2004   @[facility]  by dr lauree   ;   craniotomy w/ clipping to A-com region for bilobed aneurysm (right skull base)   CLOSED REDUCTION NASAL FRACTURE N/A 06/21/2020   Procedure: CLOSED REDUCTION NASAL FRACTURE;  Surgeon: Llewellyn Gerard LABOR, DO;  Location: [facility];  Service: ENT;  Laterality: N/A;   COLONOSCOPY WITH ESOPHAGOGASTRODUODENOSCOPY (EGD)  03/16/2018   CYSTOSCOPY W/ URETERAL STENT PLACEMENT Left 11/13/2021   Procedure: CYSTOSCOPY WITH RETROGRADE PYELOGRAM/URETERAL STENT PLACEMENT;  Surgeon: Selma Donnice SAUNDERS, MD;  Location: [facility];  Service: Urology;  Laterality: Left;   CYSTOSCOPY/URETEROSCOPY/HOLMIUM LASER/STENT PLACEMENT Left 12/05/2021   Procedure: CYSTOSCOPY/ RETROGRADE/URETEROSCOPY/HOLMIUM LASER/STENT PLACEMENT;  Surgeon: Selma Donnice SAUNDERS, MD;  Location: [facility];  Service: Urology;  Laterality: Left;   LAPAROSCOPIC BILATERAL SALPINGO OOPHERECTOMY Bilateral 11/12/2015   Procedure: LAPAROSCOPIC BILATERAL SALPINGO OOPHORECTOMY;  Surgeon: Jon Rummer, MD;  Location: [facility];  Service: Gynecology;  Laterality: Bilateral;   LAPAROSCOPY N/A 11/22/2015   Procedure: LAPAROSCOPY DIAGNOSTIC with repair of serosal tear;  Surgeon: Jon Rummer, MD;  Location: [facility];  Service: Gynecology;  Laterality: N/A;   RADIOACTIVE SEED GUIDED  EXCISIONAL BREAST BIOPSY Left 09/26/2015   Procedure: LEFT RADIOACTIVE SEED GUIDED EXCISIONAL BREAST BIOPSY;  Surgeon: Jina Nephew, MD;  Location: [facility];  Service: General;  Laterality: Left;   RADIOACTIVE SEED GUIDED EXCISIONAL BREAST BIOPSY Left 02/20/2020   Procedure: LEFT RADIOACTIVE SEED GUIDED EXCISIONAL BREAST BIOPSY;  Surgeon: Nephew Jina, MD;  Location: [facility];  Service: General;  Laterality: Left;  RNFA   WISDOM TOOTH EXTRACTION     Social History   Social History Narrative   Not on file   Immunization History  Administered Date(s) Administered   Fluad Quad(high Dose 65+) 03/06/2020, 03/26/2021, 04/02/2022, 01/06/2023   Influenza, High Dose Seasonal PF 02/25/2018, 03/01/2019   Influenza-Unspecified 01/25/2017   PFIZER Comirnaty(Gray Top)Covid-19 Tri-Sucrose Vaccine 10/02/2020   PFIZER(Purple Top)SARS-COV-2 Vaccination 06/03/2019, 06/28/2019, 01/30/2020, 10/02/2020, 09/01/2021   Pfizer Covid-19 Vaccine Bivalent Booster 81yrs & up 09/01/2021, 06/02/2022   Pfizer(Comirnaty)Fall Seasonal Vaccine 12 years and older 01/06/2023   Pneumococcal Conjugate-13 03/22/2013   Pneumococcal Polysaccharide-23 03/12/2020   Respiratory  Syncytial Virus Vaccine,Recomb Aduvanted(Arexvy) 06/02/2022   Rsv, Bivalent, Protein Subunit Rsvpref,pf Marlow) 06/02/2022   Tdap 04/02/2022   Zoster Recombinant(Shingrix ) 01/27/2021, 09/01/2021, 01/18/2023     Objective: Vital Signs: BP (!) 144/90 (BP Location: Left Arm, Patient Position: Sitting, Cuff Size: Normal)   Pulse 73   Resp 15   Ht 5' 2 (1.575 m)   Wt 203 lb (92.1 kg)   BMI 37.13 kg/m    Physical Exam Vitals and nursing note reviewed.  Constitutional:      Appearance: She is well-developed.  HENT:     Head: Normocephalic and atraumatic.  Eyes:     Conjunctiva/sclera: Conjunctivae normal.  Cardiovascular:     Rate and Rhythm: Normal rate and regular rhythm.     Heart sounds: Normal heart sounds.  Pulmonary:      Effort: Pulmonary effort is normal.     Breath sounds: Normal breath sounds.  Abdominal:     General: Bowel sounds are normal.     Palpations: Abdomen is soft.  Musculoskeletal:     Cervical back: Normal range of motion.  Lymphadenopathy:     Cervical: No cervical adenopathy.  Skin:    General: Skin is warm and dry.     Capillary Refill: Capillary refill takes less than 2 seconds.  Neurological:     Mental Status: She is alert and oriented to person, place, and time.  Psychiatric:        Behavior: Behavior normal.      Musculoskeletal Exam: Cervical spine was in good range of motion.  There was no tenderness over thoracic or lumbar spine.  Shoulders, elbows, wrist joints, MCPs PIPs and DIPs Juengel range of motion with no synovitis.  Hip joints were in good range of motion.  She had tenderness over right trochanteric bursa.  Knee joints, ankles, MTPs were in good range of motion with no synovitis.  CDAI Exam: CDAI Score: -- Patient Global: --; Provider Global: -- Swollen: --; Tender: -- Joint Exam 06/03/2023   No joint exam has been documented for this visit   There is currently no information documented on the homunculus. Go to the Rheumatology activity and complete the homunculus joint exam.  Investigation: No additional findings.  Imaging: No results found.  Recent Labs: Lab Results  Component Value Date   WBC 5.5 03/08/2023   HGB 15.0 03/08/2023   PLT 252 03/08/2023   NA 142 03/08/2023   K 4.3 03/08/2023   CL 107 (H) 03/08/2023   CO2 21 03/08/2023   GLUCOSE 86 03/08/2023   BUN 15 03/08/2023   CREATININE 0.97 03/08/2023   BILITOT 0.7 03/08/2023   ALKPHOS 52 03/08/2023   AST 15 03/08/2023   ALT 12 03/08/2023   PROT 7.5 04/05/2023   ALBUMIN 4.1 03/08/2023   CALCIUM 9.9 04/05/2023   GFRAA 44 (L) 08/29/2019    Speciality Comments: Prolia  started August 2017 by Dr. Jarold, patient received first Prolia  injection in August 2024 through our  office.  Procedures:  No procedures performed Allergies: Dilantin [phenytoin sodium extended]   Assessment / Plan:     Visit Diagnoses: Osteopenia of multiple sites - 06/24/2021: Left femoral neck BMD 0.717 with T-score -2.1.  Statistically significant decrease in BMD of AP spine and right hip.Prolia  since 11/2015 by Dr. Jarold.  Patient had been receiving Prolia  through our office from August 2024.  She denies any side effects from Prolia .  She has been taking calcium and vitamin D .  Her next Prolia  injection is due in  February 2024.- Plan: VITAMIN D  25 Hydroxy (Vit-D Deficiency, Fractures)  Medication monitoring encounter - Last Prolia  injection November 26, 2022 -will obtain labs today.  Plan: COMPLETE METABOLIC PANEL WITH GFR  Trochanteric bursitis, right hip-she had tenderness over right trochanteric bursa consistent with trochanteric bursitis.  A handout on IT band stretches was given.  She also has an appointment coming up with Dr. Patria.  Stage 3a chronic kidney disease (HCC)-creatinine was elevated in the past which was normal in November.  Essential hypertension-blood pressure was elevated at 144/90.  Patient was advised to monitor blood pressure closely and follow-up with her PCP.  Other medical problems are listed as follows:  Postinflammatory pulmonary fibrosis (HCC)  Small bowel obstruction (HCC)  Acquired hypothyroidism  Hypertensive nephropathy  Class 2 severe obesity due to excess calories with serious comorbidity and body mass index (BMI) of 36.0 to 36.9 in adult [facility])  Primary insomnia  Orders: Orders Placed This Encounter  Procedures   VITAMIN D  25 Hydroxy (Vit-D Deficiency, Fractures)   COMPLETE METABOLIC PANEL WITH GFR   No orders of the defined types were placed in this encounter.   Follow-Up Instructions: Return in about 1 year (around 06/02/2024) for Osteopenia.   Maya Nash, MD  Note - This record has been created using Animal nutritionist.  Chart  creation errors have been sought, but may not always  have been located. Such creation errors do not reflect on  the standard of medical care.

## 2023-05-27 NOTE — Telephone Encounter (Signed)
Patient due for Prolia on 05/25/23  Will need updated labs (CBC, CMP, Vitamin D).  Per test claim, copay is $155 for Prolia. ATC patient. She will need to confirm she is okay with copay and labs to be completed.  Can administer Prolia at [facility] on 06/01/2023 next week if she returns call  Chesley Mires, PharmD, MPH, BCPS, CPP Clinical Pharmacist (Rheumatology and Pulmonology)

## 2023-05-29 DIAGNOSIS — M25551 Pain in right hip: Secondary | ICD-10-CM | POA: Insufficient documentation

## 2023-05-29 NOTE — Assessment & Plan Note (Signed)
Her sx are conflicting. Initially described as dull pain. Also c/o sharp, burning pain which is suggestive of meralgia paresthetica.  She is also advised to use lidocaine patch prn.  However, due to discomfort occurring after being seated, I will refer her to Ortho for radiographic changes.

## 2023-05-29 NOTE — Assessment & Plan Note (Signed)
Chronic, also followed by Endo.  I will check labs as below and forward to Endo.

## 2023-05-29 NOTE — Assessment & Plan Note (Signed)
Chronic, controlled.  She will continue with valsartan 80mg  daily.  She will also c/w hydrochlorothiazide 12.5mg  every other day.  She is encouraged to gradually increase daily activity. She will f/u in four to six months for re-evaluation.

## 2023-05-29 NOTE — Assessment & Plan Note (Signed)
 Chronic, she is encouraged to stay well hydrated, avoid NSAIDs and keep BP controlled to prevent progression of CKD.

## 2023-06-01 ENCOUNTER — Ambulatory Visit: Payer: Medicare HMO | Admitting: Rheumatology

## 2023-06-01 DIAGNOSIS — Z90710 Acquired absence of both cervix and uterus: Secondary | ICD-10-CM

## 2023-06-01 DIAGNOSIS — I129 Hypertensive chronic kidney disease with stage 1 through stage 4 chronic kidney disease, or unspecified chronic kidney disease: Secondary | ICD-10-CM

## 2023-06-01 DIAGNOSIS — N1831 Chronic kidney disease, stage 3a: Secondary | ICD-10-CM

## 2023-06-01 DIAGNOSIS — K56609 Unspecified intestinal obstruction, unspecified as to partial versus complete obstruction: Secondary | ICD-10-CM

## 2023-06-01 DIAGNOSIS — J8410 Pulmonary fibrosis, unspecified: Secondary | ICD-10-CM

## 2023-06-01 DIAGNOSIS — I10 Essential (primary) hypertension: Secondary | ICD-10-CM

## 2023-06-01 DIAGNOSIS — M8589 Other specified disorders of bone density and structure, multiple sites: Secondary | ICD-10-CM

## 2023-06-01 DIAGNOSIS — E2839 Other primary ovarian failure: Secondary | ICD-10-CM

## 2023-06-01 DIAGNOSIS — F5101 Primary insomnia: Secondary | ICD-10-CM

## 2023-06-01 DIAGNOSIS — E039 Hypothyroidism, unspecified: Secondary | ICD-10-CM

## 2023-06-02 ENCOUNTER — Other Ambulatory Visit: Payer: Self-pay

## 2023-06-02 MED ORDER — HYDROCHLOROTHIAZIDE 12.5 MG PO CAPS: ORAL_CAPSULE | ORAL | 1 refills | Status: DC

## 2023-06-03 ENCOUNTER — Ambulatory Visit: Payer: Medicare HMO | Attending: Rheumatology | Admitting: Rheumatology

## 2023-06-03 ENCOUNTER — Encounter: Payer: Self-pay | Admitting: Rheumatology

## 2023-06-03 VITALS — BP 144/90 | HR 73 | Resp 15 | Ht 62.0 in | Wt 203.0 lb

## 2023-06-03 DIAGNOSIS — Z6836 Body mass index (BMI) 36.0-36.9, adult: Secondary | ICD-10-CM

## 2023-06-03 DIAGNOSIS — Z5181 Encounter for therapeutic drug level monitoring: Secondary | ICD-10-CM

## 2023-06-03 DIAGNOSIS — I10 Essential (primary) hypertension: Secondary | ICD-10-CM

## 2023-06-03 DIAGNOSIS — N1831 Chronic kidney disease, stage 3a: Secondary | ICD-10-CM | POA: Diagnosis not present

## 2023-06-03 DIAGNOSIS — E66812 Obesity, class 2: Secondary | ICD-10-CM

## 2023-06-03 DIAGNOSIS — J8410 Pulmonary fibrosis, unspecified: Secondary | ICD-10-CM

## 2023-06-03 DIAGNOSIS — M8589 Other specified disorders of bone density and structure, multiple sites: Secondary | ICD-10-CM

## 2023-06-03 DIAGNOSIS — E039 Hypothyroidism, unspecified: Secondary | ICD-10-CM

## 2023-06-03 DIAGNOSIS — M7061 Trochanteric bursitis, right hip: Secondary | ICD-10-CM

## 2023-06-03 DIAGNOSIS — I129 Hypertensive chronic kidney disease with stage 1 through stage 4 chronic kidney disease, or unspecified chronic kidney disease: Secondary | ICD-10-CM

## 2023-06-03 DIAGNOSIS — F5101 Primary insomnia: Secondary | ICD-10-CM

## 2023-06-03 DIAGNOSIS — K56609 Unspecified intestinal obstruction, unspecified as to partial versus complete obstruction: Secondary | ICD-10-CM

## 2023-06-03 NOTE — Patient Instructions (Signed)
 Iliotibial Band Syndrome Rehab Ask your health care provider which exercises are safe for you. Do exercises exactly as told by your provider and adjust them as told. It's normal to feel mild stretching, pulling, tightness, or discomfort as you do these exercises. Stop right away if you feel sudden pain or your pain gets a lot worse. Do not begin these exercises until told by your provider. Stretching and range-of-motion exercises These exercises warm up your muscles and joints. They also improve the movement and flexibility of your hip and pelvis. Quadriceps stretch, prone  Lie face down (prone) on a firm surface like a bed or padded floor. Bend your left / right knee. Reach back to hold your ankle or pant leg. If you can't reach your ankle or pant leg, use a belt looped around your foot and grab the belt instead. Gently pull your heel toward your butt. Your knee should not slide out to the side. You should feel a stretch in the front of your thigh and knee, also called the quadriceps. Hold this position for __________ seconds. Repeat __________ times. Complete this exercise __________ times a day. Iliotibial band stretch The iliotibial band is a strip of tissue that runs along the outside of your hip down to your knee. Lie on your side with your left / right leg on top. Bend both knees and grab your left / right ankle. Stretch out your bottom arm to help you balance. Slowly bring your top knee back so your thigh goes behind your back. Slowly lower your top leg toward the floor until you feel a gentle stretch on the outside of your left / right hip and thigh. If you don't feel a stretch and your knee won't go farther, place the heel of your other foot on top of your knee and pull your knee down toward the floor with your foot. Hold this position for __________ seconds. Repeat __________ times. Complete this exercise __________ times a day. Strengthening exercises These exercises build strength  and endurance in your hip and pelvis. Endurance means your muscles can keep working even when they're tired. Straight leg raises, side-lying This exercise strengthens the muscles that rotate the leg at the hip and move it away from your body. These muscles are called hip abductors. Lie on your side with your left / right leg on top. Lie so your head, shoulder, hip, and knee line up. You can bend your bottom knee to help you balance. Roll your hips slightly forward so they're stacked directly over each other. Your left / right knee should face forward. Tense the muscles in your outer thigh and hip. Lift your top leg 4-6 inches (10-15 cm) off the ground. Hold this position for __________ seconds. Slowly lower your leg back down to the starting position. Let your muscles fully relax before doing this exercise again. Repeat __________ times. Complete this exercise __________ times a day. Leg raises, prone This exercise strengthens the muscles that move the hips backward. These muscles are called hip extensors. Lie face down (prone) on your bed or a firm surface. You can put a pillow under your hips for comfort and to support your lower back. Bend your left / right knee so your foot points straight up toward the ceiling. Keep the other leg straight and behind you. Squeeze your butt muscles. Lift your left / right thigh off the firm surface. Do not let your back arch. Tense your thigh muscle as hard as you can without having  more knee pain. Hold this position for __________ seconds. Slowly lower your leg to the starting position. Allow your leg to relax all the way. Repeat __________ times. Complete this exercise __________ times a day. Hip hike  Stand sideways on a bottom step. Place your feet so that your left / right leg is on the step, and the other foot is hanging off the side. If you need support for balance, hold onto a railing or wall. Keep your knees straight and your abdomen square,  meaning your hips are level. Then, lift your left / right hip up toward the ceiling. Slowly let your leg that's hanging off the step lower towards the floor. Your foot should get closer to the ground. Do not lean or bend your knees during this movement. Repeat __________ times. Complete this exercise __________ times a day. This information is not intended to replace advice given to you by your health care provider. Make sure you discuss any questions you have with your health care provider. Document Revised: 06/26/2022 Document Reviewed: 06/26/2022 Elsevier Patient Education  2024 [employer].

## 2023-06-04 LAB — COMPLETE METABOLIC PANEL WITH GFR
AG Ratio: 1.3 (calc) (ref 1.0–2.5)
ALT: 16 U/L (ref 6–29)
AST: 19 U/L (ref 10–35)
Albumin: 4 g/dL (ref 3.6–5.1)
Alkaline phosphatase (APISO): 48 U/L (ref 37–153)
BUN/Creatinine Ratio: 12 (calc) (ref 6–22)
BUN: 17 mg/dL (ref 7–25)
CO2: 26 mmol/L (ref 20–32)
Calcium: 10 mg/dL (ref 8.6–10.4)
Chloride: 109 mmol/L (ref 98–110)
Creat: 1.44 mg/dL — ABNORMAL HIGH (ref 0.60–1.00)
Globulin: 3.1 g/dL (ref 1.9–3.7)
Glucose, Bld: 98 mg/dL (ref 65–99)
Potassium: 4.4 mmol/L (ref 3.5–5.3)
Sodium: 143 mmol/L (ref 135–146)
Total Bilirubin: 0.8 mg/dL (ref 0.2–1.2)
Total Protein: 7.1 g/dL (ref 6.1–8.1)
eGFR: 39 mL/min/{1.73_m2} — ABNORMAL LOW (ref >=60)

## 2023-06-04 LAB — VITAMIN D 25 HYDROXY (VIT D DEFICIENCY, FRACTURES): Vit D, 25-Hydroxy: 51 ng/mL (ref 30–100)

## 2023-06-04 NOTE — Progress Notes (Signed)
 Vitamin D  is normal.  CMP shows elevated creatinine, most likely due to diuretic use.  Please forward results to her PCP.

## 2023-06-15 ENCOUNTER — Other Ambulatory Visit (INDEPENDENT_AMBULATORY_CARE_PROVIDER_SITE_OTHER): Payer: Medicare HMO

## 2023-06-15 ENCOUNTER — Ambulatory Visit (INDEPENDENT_AMBULATORY_CARE_PROVIDER_SITE_OTHER): Payer: Medicare HMO | Admitting: Orthopaedic Surgery

## 2023-06-15 DIAGNOSIS — M25551 Pain in right hip: Secondary | ICD-10-CM

## 2023-06-15 NOTE — Progress Notes (Signed)
 Office Visit Note   Patient: Rachel [name]           Date of Birth: 1950-05-17           MRN: [id number] Visit Date: 06/15/2023              Requested by: Dorothyann Peng, MD [address] [place],  [place] [address] PCP: Dorothyann Peng, MD   Assessment & Plan: Visit Diagnoses:  1. Pain in right hip     Plan: Rachel [name] is a 73 year old female with right hip trochanteric pain.  Possible etiologies discussed and treatment options were reviewed.  Her symptoms are sporadic at this time.  Recommend home exercise program and over-the-counter medications as needed.  Follow-up as needed.  Follow-Up Instructions: No follow-ups on file.   Orders:  Orders Placed This Encounter  Procedures   XR HIP UNILAT W OR W/O PELVIS 2-3 VIEWS RIGHT   No orders of the defined types were placed in this encounter.     Procedures: No procedures performed   Clinical Data: No additional findings.   Subjective: Chief Complaint  Patient presents with   Right Hip - Pain    HPI Rachel [name] is a 73 year old female here for evaluation of right hip pain for a month.  This is usually worse at night.  Denies any groin pain or low back pain.  She has lateral hip pain.  This is not constant.  It happens every few weeks or so. Review of Systems  Constitutional: Negative.   HENT: Negative.    Eyes: Negative.   Respiratory: Negative.    Cardiovascular: Negative.   Endocrine: Negative.   Musculoskeletal: Negative.   Neurological: Negative.   Hematological: Negative.   Psychiatric/Behavioral: Negative.    All other systems reviewed and are negative.    Objective: Vital Signs: There were no vitals taken for this visit.  Physical Exam Vitals and nursing note reviewed.  Constitutional:      Appearance: She is well-developed.  HENT:     Head: Atraumatic.     Nose: Nose normal.  Eyes:     Extraocular Movements: Extraocular movements intact.  Cardiovascular:     Pulses: Normal pulses.   Pulmonary:     Effort: Pulmonary effort is normal.  Abdominal:     Palpations: Abdomen is soft.  Musculoskeletal:     Cervical back: Neck supple.  Skin:    General: Skin is warm.     Capillary Refill: Capillary refill takes less than 2 seconds.  Neurological:     Mental Status: She is alert. Mental status is at baseline.  Psychiatric:        Behavior: Behavior normal.        Thought Content: Thought content normal.        Judgment: Judgment normal.     Ortho Exam Examination of the right hip shows full range of motion without pain.  She has trochanteric tenderness.  No lumbar spine tenderness. Specialty Comments:  No specialty comments available.  Imaging: XR HIP UNILAT W OR W/O PELVIS 2-3 VIEWS RIGHT Result Date: 06/15/2023 X-rays of the pelvis show mild osteoarthritis and degenerative changes.  No acute abnormalities.    PMFS History: Patient Active Problem List   Diagnosis Date Noted   Right hip pain 05/29/2023   Personal history of other colon polyps 04/17/2023   Encounter for annual physical exam 04/05/2023   Nonintractable headache 03/08/2023   SOB (shortness of breath) 03/08/2023   Class 1 obesity due  to excess calories with serious comorbidity and body mass index (BMI) of 34.0 to 34.9 in adult 03/08/2023   Hair thinning 10/05/2022   Primary insomnia 10/12/2020   Osteopenia 10/12/2020   Stage 3a chronic kidney disease (HCC) 10/12/2020   Acquired hypothyroidism 08/16/2020   Class 2 severe obesity due to excess calories with serious comorbidity and body mass index (BMI) of 36.0 to 36.9 in adult (HCC) 02/25/2018   Primary hypothyroidism 02/25/2018   Other abnormal glucose 02/25/2018   Hypertensive nephropathy 02/25/2018   Chronic renal disease, stage II 02/25/2018   Postinflammatory pulmonary fibrosis (HCC) 11/09/2016   Morbid obesity due to excess calories (HCC) 12/17/2015   Small bowel obstruction (HCC) 11/19/2015   Nausea with vomiting 11/18/2015   S/P  vaginal hysterectomy 11/12/2015   Essential hypertension 04/03/2013   Past Medical History:  Diagnosis Date   Allergy 04/12/2004   CKD (chronic kidney disease), stage III [facility])    nephrologist--- dr l. foster   GERD (gastroesophageal reflux disease)    Glaucoma, right eye    Hiatal hernia    History of gastric ulcer    per egd 2019 non-bleeding   History of spontaneous subarachnoid intracranial hemorrhage due to cerebral aneurysm 03/2004   due to A-com region bilobed aneursym  s/p craniotomy with clipping;  per pt no residual and no issues since   Hypertension    followed by pcp   Hypertensive nephropathy    Hypothyroidism    endocrinologist--- dr Lonzo Cloud   IDA (iron deficiency anemia)    Insomnia    Mastodynia of left breast    OAB (overactive bladder)    Osteoporosis    Renal calculus, left    Wears glasses    Wears partial dentures    lower    Family History  Problem Relation Age of Onset   Cancer Mother        Breast    Hypertension Mother    Cancer Father        Prostate   Hypertension Father    Cancer Sister        Breast   COPD Sister    Prostate cancer Brother    Healthy Son     Past Surgical History:  Procedure Laterality Date   CEREBRAL ANEURYSM REPAIR  03/2004   @[facility]  by dr Cephus Slater   ;   craniotomy w/ clipping to A-com region for bilobed aneurysm (right skull base)   CLOSED REDUCTION NASAL FRACTURE N/A 06/21/2020   Procedure: CLOSED REDUCTION NASAL FRACTURE;  Surgeon: Laren Boom, DO;  Location: [facility];  Service: ENT;  Laterality: N/A;   COLONOSCOPY WITH ESOPHAGOGASTRODUODENOSCOPY (EGD)  03/16/2018   CYSTOSCOPY W/ URETERAL STENT PLACEMENT Left 11/13/2021   Procedure: CYSTOSCOPY WITH RETROGRADE PYELOGRAM/URETERAL STENT PLACEMENT;  Surgeon: Jannifer Hick, MD;  Location: [facility];  Service: Urology;  Laterality: Left;   CYSTOSCOPY/URETEROSCOPY/HOLMIUM LASER/STENT PLACEMENT Left 12/05/2021   Procedure: CYSTOSCOPY/ RETROGRADE/URETEROSCOPY/HOLMIUM  LASER/STENT PLACEMENT;  Surgeon: Jannifer Hick, MD;  Location: [facility];  Service: Urology;  Laterality: Left;   LAPAROSCOPIC BILATERAL SALPINGO OOPHERECTOMY Bilateral 11/12/2015   Procedure: LAPAROSCOPIC BILATERAL SALPINGO OOPHORECTOMY;  Surgeon: Osborn Coho, MD;  Location: [facility];  Service: Gynecology;  Laterality: Bilateral;   LAPAROSCOPY N/A 11/22/2015   Procedure: LAPAROSCOPY DIAGNOSTIC with repair of serosal tear;  Surgeon: Osborn Coho, MD;  Location: [facility];  Service: Gynecology;  Laterality: N/A;   RADIOACTIVE SEED GUIDED EXCISIONAL BREAST BIOPSY Left 09/26/2015   Procedure: LEFT RADIOACTIVE SEED GUIDED  EXCISIONAL BREAST BIOPSY;  Surgeon: Almond Lint, MD;  Location[facility];  Service: General;  Laterality: Left;   RADIOACTIVE SEED GUIDED EXCISIONAL BREAST BIOPSY Left 02/20/2020   Procedure: LEFT RADIOACTIVE SEED GUIDED EXCISIONAL BREAST BIOPSY;  Surgeon: Almond Lint, MD;  Location: [facility];  Service: General;  Laterality: Left;  RNFA   WISDOM TOOTH EXTRACTION     Social History   Occupational History   Occupation: retired  Tobacco Use   Smoking status: Former    Current packs/day: 0.00    Average packs/day: 1 pack/day for 32.9 years (32.9 ttl pk-yrs)    Types: Cigarettes    Start date: 52    Quit date: 04/02/2004    Years since quitting: 19.2    Passive exposure: Past   Smokeless tobacco: Never  Vaping Use   Vaping status: Never Used  Substance and Sexual Activity   Alcohol use: No   Drug use: No   Sexual activity: Not Currently    Birth control/protection: Post-menopausal

## 2023-06-21 ENCOUNTER — Other Ambulatory Visit (HOSPITAL_COMMUNITY): Payer: Self-pay

## 2023-06-21 NOTE — Telephone Encounter (Signed)
 ATC patient regarding Prolia appt. Test claim still shows cost of $155. Patient needs to return call to confirm copay before scheduling of shipment.

## 2023-06-24 ENCOUNTER — Other Ambulatory Visit (HOSPITAL_COMMUNITY): Payer: Self-pay

## 2023-06-24 ENCOUNTER — Other Ambulatory Visit: Payer: Self-pay

## 2023-06-24 MED ORDER — DENOSUMAB 60 MG/ML ~~LOC~~ SOSY
60.0000 mg | PREFILLED_SYRINGE | Freq: Once | SUBCUTANEOUS | Status: AC
  Administered 2023-07-01: 60 mg via SUBCUTANEOUS

## 2023-06-24 MED ORDER — PROLIA 60 MG/ML ~~LOC~~ SOSY
60.0000 mg | PREFILLED_SYRINGE | SUBCUTANEOUS | 0 refills | Status: DC
Start: 2023-06-24 — End: 2023-12-29
  Filled 2023-06-24 (×2): qty 1, 180d supply, fill #0

## 2023-06-24 NOTE — Progress Notes (Signed)
 Specialty Pharmacy Refill Coordination Note  Rachel [name] is a 73 y.o. female contacted today regarding refills of specialty medication(s) Denosumab (Prolia)   Patient requested Courier to Provider Office   Delivery date: 06/29/23   Verified address: [facility] [address] [place]   Medication will be filled on 06/28/23.   Patient ok'd $155 copay to cc provided today.

## 2023-06-24 NOTE — Telephone Encounter (Signed)
 Patient returned call regarding Prolia. She is comfortable with $155 copay. She'd like to confirm payment info on file is correct for billing.  Rx sent to [facility] for appt on 07/01/2023  Chesley Mires, PharmD, MPH, BCPS, CPP Clinical Pharmacist (Rheumatology and Pulmonology)

## 2023-06-28 ENCOUNTER — Encounter: Payer: Self-pay | Admitting: Internal Medicine

## 2023-06-28 ENCOUNTER — Ambulatory Visit (INDEPENDENT_AMBULATORY_CARE_PROVIDER_SITE_OTHER): Payer: Medicare HMO | Admitting: Internal Medicine

## 2023-06-28 VITALS — BP 118/74 | HR 74 | Ht 62.0 in | Wt 197.0 lb

## 2023-06-28 DIAGNOSIS — E039 Hypothyroidism, unspecified: Secondary | ICD-10-CM

## 2023-06-28 NOTE — Progress Notes (Unsigned)
 Name: Rachel [name]  MRN/ DOB: [id number], 1950/08/26    Age/ Sex: 73 y.o., female     PCP: Dorothyann Peng, MD   Reason for Endocrinology Evaluation: Hypothyroidism     Initial Endocrinology Clinic Visit: 08/16/2020    PATIENT IDENTIFIER: Rachel [name] is a 73 y.o., female with a past medical history of HTN and pulmonary fibrosis . She has followed with [facility] since 08/16/2020 for consultative assistance with management of her Hypothyroidism.   HISTORICAL SUMMARY:   She has been diagnosed with hypothyroidism years ago. Has been on Lt-4 ever since.    Sister with thyroid disease  SUBJECTIVE:     Today (06/28/2023):  Rachel [name] is here for hypothyroidism. Weight fluctuates  Denies tremors or palpitations  Denies constipation or diarrhea  Denies local neck symptoms  No biotin intake    Synthroid 88 mcg daily     HISTORY:  Past Medical History:  Past Medical History:  Diagnosis Date   Allergy 04/12/2004   CKD (chronic kidney disease), stage III [facility])    nephrologist--- dr l. foster   GERD (gastroesophageal reflux disease)    Glaucoma, right eye    Hiatal hernia    History of gastric ulcer    per egd 2019 non-bleeding   History of spontaneous subarachnoid intracranial hemorrhage due to cerebral aneurysm 03/2004   due to A-com region bilobed aneursym  s/p craniotomy with clipping;  per pt no residual and no issues since   Hypertension    followed by pcp   Hypertensive nephropathy    Hypothyroidism    endocrinologist--- dr Lonzo Cloud   IDA (iron deficiency anemia)    Insomnia    Mastodynia of left breast    OAB (overactive bladder)    Osteoporosis    Renal calculus, left    Wears glasses    Wears partial dentures    lower   Past Surgical History:  Past Surgical History:  Procedure Laterality Date   CEREBRAL ANEURYSM REPAIR  03/2004   @[facility]  by dr Cephus Slater   ;   craniotomy w/ clipping to A-com region for bilobed aneurysm (right  skull base)   CLOSED REDUCTION NASAL FRACTURE N/A 06/21/2020   Procedure: CLOSED REDUCTION NASAL FRACTURE;  Surgeon: Laren Boom, DO;  Location: [facility];  Service: ENT;  Laterality: N/A;   COLONOSCOPY WITH ESOPHAGOGASTRODUODENOSCOPY (EGD)  03/16/2018   CYSTOSCOPY W/ URETERAL STENT PLACEMENT Left 11/13/2021   Procedure: CYSTOSCOPY WITH RETROGRADE PYELOGRAM/URETERAL STENT PLACEMENT;  Surgeon: Jannifer Hick, MD;  [facility];  Service: Urology;  Laterality: Left;   CYSTOSCOPY/URETEROSCOPY/HOLMIUM LASER/STENT PLACEMENT Left 12/05/2021   Procedure: CYSTOSCOPY/ RETROGRADE/URETEROSCOPY/HOLMIUM LASER/STENT PLACEMENT;  Surgeon: Jannifer Hick, MD;  Location: [facility];  Service: Urology;  Laterality: Left;   LAPAROSCOPIC BILATERAL SALPINGO OOPHERECTOMY Bilateral 11/12/2015   Procedure: LAPAROSCOPIC BILATERAL SALPINGO OOPHORECTOMY;  Surgeon: Osborn Coho, MD;  Location: [facility];  Service: Gynecology;  Laterality: Bilateral;   LAPAROSCOPY N/A 11/22/2015   Procedure: LAPAROSCOPY DIAGNOSTIC with repair of serosal tear;  Surgeon: Osborn Coho, MD;  Location[facility];  Service: Gynecology;  Laterality: N/A;   RADIOACTIVE SEED GUIDED EXCISIONAL BREAST BIOPSY Left 09/26/2015   Procedure: LEFT RADIOACTIVE SEED GUIDED EXCISIONAL BREAST BIOPSY;  Surgeon: Almond Lint, MD;  [facility];  Service: General;  Laterality: Left;   RADIOACTIVE SEED GUIDED EXCISIONAL BREAST BIOPSY Left 02/20/2020   Procedure: LEFT RADIOACTIVE SEED GUIDED EXCISIONAL BREAST BIOPSY;  Surgeon: Almond Lint, MD;  Location: [facility];  Service: General;  Laterality: Left;  RNFA   WISDOM TOOTH EXTRACTION     Social History:  reports that she quit smoking about 19 years ago. Her smoking use included cigarettes. She started smoking about 52 years ago. She has a 32.9 pack-year smoking history. She has been exposed to tobacco smoke. She has never used smokeless tobacco. She reports that she does not  drink alcohol and does not use drugs. Family History:  Family History  Problem Relation Age of Onset   Cancer Mother        Breast    Hypertension Mother    Cancer Father        Prostate   Hypertension Father    Cancer Sister        Breast   COPD Sister    Prostate cancer Brother    Healthy Son      HOME MEDICATIONS: Allergies as of 06/28/2023       Reactions   Dilantin [phenytoin Sodium Extended] Rash        Medication List        Accurate as of June 28, 2023  1:41 PM. If you have any questions, ask your nurse or doctor.          calcium carbonate 500 MG chewable tablet Commonly known as: TUMS - dosed in mg elemental calcium Chew 4 tablets by mouth 2 (two) times daily as needed for indigestion or heartburn.   cholecalciferol 25 MCG (1000 UNIT) tablet Commonly known as: VITAMIN D3 Take 1,000 Units by mouth daily.   Combigan 0.2-0.5 % ophthalmic solution Generic drug: brimonidine-timolol Place 1 drop into the right eye 2 (two) times daily.   famotidine 40 MG tablet Commonly known as: PEPCID Take 40 mg by mouth 2 (two) times daily.   hydrochlorothiazide 12.5 MG capsule Commonly known as: MICROZIDE TAKE 1 CAPSULE BY MOUTH EVERY OTHER DAY   IRON 27 PO Take 1 tablet by mouth at bedtime.   Magnesium 250 MG Tabs Take 250 mg by mouth at bedtime.   nystatin-triamcinolone cream Commonly known as: MYCOLOG II SMARTSIG:Topical Morning-Evening   Prolia 60 MG/ML Sosy injection Generic drug: denosumab Inject 60 mg into the skin every 6 (six) months. Courier to [facility]: [address], [place] [address]. Appt on 07/01/2023. CALL PT TO CONFIRM PAYMENT INFO   simvastatin 40 MG tablet Commonly known as: ZOCOR TAKE 1 TABLET BY MOUTH EVERYDAY AT BEDTIME   solifenacin 10 MG tablet Commonly known as: VESICARE Take 10 mg by mouth daily.   Synthroid 88 MCG tablet Generic drug: levothyroxine Take 1 tablet (88 mcg total) by mouth daily before breakfast.    temazepam 15 MG capsule Commonly known as: RESTORIL TAKE 1 CAPSULE BY MOUTH EVERY DAY AT BEDTIME AS NEEDED   valsartan 80 MG tablet Commonly known as: DIOVAN Take 80 mg by mouth daily.          OBJECTIVE:   PHYSICAL EXAM: VS: BP 118/74 (BP Location: Left Arm, Patient Position: Sitting, Cuff Size: Normal)   Pulse 74   Ht 5\' 2"  (1.575 m)   Wt 197 lb (89.4 kg)   SpO2 95%   BMI 36.03 kg/m    EXAM: General: Pt appears well and is in NAD  Neck: General: Supple without adenopathy. Thyroid: Thyroid size normal.  No goiter or nodules appreciated.   Lungs: Clear with good BS bilat   Heart: Auscultation: RRR.  Extremities:  BL LE: No pretibial edema  Mental Status: Judgment, insight: Intact Orientation: Oriented to time, place, and person Memory: Intact for recent and remote events Mood and affect: No depression, anxiety, or agitation     DATA REVIEWED:     05/26/2023 TSH 0.02 uIU/mL   ASSESSMENT / PLAN / RECOMMENDATIONS:   Hypothyroidism:   - Pt is cliniclaly euthyroid - NO local neck symptoms  -She had labs done through PCPs office with a TSH of 0.2 uIU/mL in 1/ 2025 -I will decrease Synthroid from 100 mcg to 88 mcg  -We discussed the risk of cardiac arrhythmia as well as increased bone resorption with iatrogenic hyperthyroidism -Repeat TSH**   Medications    Synthroid 88 MCG daily   F/U in 4 months    Signed electronically by: Lyndle Herrlich, MD  [facility]  [facility] [address] [place], [place] [address] Phone: [phone] FAX: [phone]      CC: Dorothyann Peng, MD [address] [place] [address] Phone: [phone]  Fax: [phone]   Return to Endocrinology clinic as below: Future Appointments  Date Time Provider Department Center  07/01/2023 11:00 AM Murrell Redden, RPH-CPP [facility] None  10/04/2023  3:00 PM Dorothyann Peng, MD [facility] None  04/06/2024  3:00  PM Dorothyann Peng, MD [facility] None  06/01/2024  1:00 PM Pollyann Savoy, MD [facility] None  06/14/2024  2:00 PM [facility]-ANNUAL WELLNESS VISIT [facility] None  06/14/2024  2:40 PM Dorothyann Peng, MD [facility] None

## 2023-06-29 ENCOUNTER — Telehealth: Payer: Self-pay | Admitting: Internal Medicine

## 2023-06-29 LAB — TSH: TSH: 0.15 m[IU]/L — ABNORMAL LOW (ref 0.40–4.50)

## 2023-06-29 MED ORDER — SYNTHROID 75 MCG PO TABS: 75.0000 ug | ORAL_TABLET | Freq: Every day | ORAL | 6 refills | Status: DC

## 2023-06-29 NOTE — Telephone Encounter (Signed)
 LMTCB

## 2023-06-29 NOTE — Telephone Encounter (Signed)
 Please let the patient know that her thyroid test continues to show that she is on too much Synthroid.   Needs to stop Synthroid 88 mcg and START synthroid 75 mcg daily    Thanks

## 2023-06-30 NOTE — Telephone Encounter (Signed)
 LMTCB

## 2023-07-01 ENCOUNTER — Ambulatory Visit: Payer: Medicare HMO | Attending: Rheumatology | Admitting: Pharmacist

## 2023-07-01 DIAGNOSIS — Z7689 Persons encountering health services in other specified circumstances: Secondary | ICD-10-CM

## 2023-07-01 DIAGNOSIS — M8589 Other specified disorders of bone density and structure, multiple sites: Secondary | ICD-10-CM

## 2023-07-01 NOTE — Progress Notes (Signed)
 Pharmacy Note  Subjective:   Patient presents to clinic today to receive bi-annual dose of Prolia. Patient's last dose of Prolia was on 8/1/20240  Patient running a fever or have signs/symptoms of infection? No  Patient currently on antibiotics for the treatment of infection? No  Patient had fall in the last 6 months?  No   Patient taking calcium 1200 mg daily through diet or supplement and at least 800 units vitamin D? Yes  Objective: CMP     Component Value Date/Time   NA 143 06/03/2023 1328   NA 142 03/08/2023 1056   K 4.4 06/03/2023 1328   CL 109 06/03/2023 1328   CO2 26 06/03/2023 1328   GLUCOSE 98 06/03/2023 1328   BUN 17 06/03/2023 1328   BUN 15 03/08/2023 1056   CREATININE 1.44 (H) 06/03/2023 1328   CALCIUM 10.0 06/03/2023 1328   PROT 7.1 06/03/2023 1328   PROT 7.5 04/05/2023 1603   ALBUMIN 4.1 03/08/2023 1056   AST 19 06/03/2023 1328   ALT 16 06/03/2023 1328   ALKPHOS 52 03/08/2023 1056   BILITOT 0.8 06/03/2023 1328   BILITOT 0.7 03/08/2023 1056   GFRNONAA 47 (L) 11/13/2021 1430   GFRAA 44 (L) 08/29/2019 1523    CBC    Component Value Date/Time   WBC 5.5 03/08/2023 1056   WBC 5.6 11/13/2021 1353   RBC 4.66 03/08/2023 1056   RBC 4.28 11/13/2021 1353   HGB 15.0 03/08/2023 1056   HCT 46.4 03/08/2023 1056   PLT 252 03/08/2023 1056   MCV 100 (H) 03/08/2023 1056   MCH 32.2 03/08/2023 1056   MCH 32.7 11/13/2021 1353   MCHC 32.3 03/08/2023 1056   MCHC 31.9 11/13/2021 1353   RDW 12.6 03/08/2023 1056   LYMPHSABS 1.7 11/13/2021 1353   MONOABS 0.6 11/13/2021 1353   EOSABS 0.2 11/13/2021 1353   BASOSABS 0.0 11/13/2021 1353    Lab Results  Component Value Date   VD25OH 51 06/03/2023   T-score: 06/24/2021: Left femoral neck BMD 0.717 with T-score -2.1. Statistically significant decrease in BMD of AP spine and right hip   Assessment/Plan:   Reviewed importance of adequate dietary intake of calcium in addition to supplementation due to risk of hypocalcemia  with Prolia.   Patient tolerated injection well.   Administration details as below: Administrations This Visit     denosumab (PROLIA) injection 60 mg     Admin Date 07/01/2023 Action Given Dose 60 mg Route Subcutaneous Documented By Murrell Redden, RPH-CPP            Patient's next Prolia dose is due on 12/28/2023   Patient is due for updated DEXA in December 2025 or January 2026. Order placed for [facility] today   All questions encouraged and answered.  Instructed patient to call with any further questions or concerns.  Chesley Mires, PharmD, MPH, BCPS, CPP Clinical Pharmacist (Rheumatology and Pulmonology)

## 2023-07-01 NOTE — Telephone Encounter (Signed)
 LDTVM per patient instructions

## 2023-07-01 NOTE — Patient Instructions (Addendum)
 Order placed for updated bone density at [facility] at the end of December 2025

## 2023-08-07 ENCOUNTER — Other Ambulatory Visit: Payer: Self-pay | Admitting: Internal Medicine

## 2023-09-01 ENCOUNTER — Other Ambulatory Visit: Payer: Self-pay | Admitting: Internal Medicine

## 2023-09-01 DIAGNOSIS — F5101 Primary insomnia: Secondary | ICD-10-CM

## 2023-10-04 ENCOUNTER — Encounter: Payer: Self-pay | Admitting: Internal Medicine

## 2023-10-04 ENCOUNTER — Ambulatory Visit (INDEPENDENT_AMBULATORY_CARE_PROVIDER_SITE_OTHER): Payer: Self-pay | Admitting: Internal Medicine

## 2023-10-04 VITALS — BP 116/78 | HR 81 | Temp 98.4°F | Ht 62.0 in | Wt 200.8 lb

## 2023-10-04 DIAGNOSIS — E039 Hypothyroidism, unspecified: Secondary | ICD-10-CM | POA: Diagnosis not present

## 2023-10-04 DIAGNOSIS — R7309 Other abnormal glucose: Secondary | ICD-10-CM

## 2023-10-04 DIAGNOSIS — I1310 Hypertensive heart and chronic kidney disease without heart failure, with stage 1 through stage 4 chronic kidney disease, or unspecified chronic kidney disease: Secondary | ICD-10-CM

## 2023-10-04 DIAGNOSIS — N1831 Chronic kidney disease, stage 3a: Secondary | ICD-10-CM | POA: Diagnosis not present

## 2023-10-04 DIAGNOSIS — Z23 Encounter for immunization: Secondary | ICD-10-CM | POA: Diagnosis not present

## 2023-10-04 DIAGNOSIS — I700 Atherosclerosis of aorta: Secondary | ICD-10-CM | POA: Diagnosis not present

## 2023-10-04 NOTE — Patient Instructions (Signed)
 Coronary Calcium Scan- cost is $99 A coronary calcium scan is an imaging test used to look for deposits of plaque in the inner lining of the blood vessels of the heart (coronary arteries). Plaque is made up of calcium, protein, and fatty substances. These deposits of plaque can partly clog and narrow the coronary arteries without producing any symptoms or warning signs. This puts a person at risk for a heart attack. A coronary calcium scan is performed using a computed tomography (CT) scanner machine without using a dye (contrast). This test is recommended for people who are at moderate risk for heart disease. The test can find plaque deposits before symptoms develop. Tell a health care provider about: Any allergies you have. All medicines you are taking, including vitamins, herbs, eye drops, creams, and over-the-counter medicines. Any problems you or family members have had with anesthetic medicines. Any bleeding problems you have. Any surgeries you have had. Any medical conditions you have. Whether you are pregnant or may be pregnant. What are the risks? Generally, this is a safe procedure. However, problems may occur, including: Harm to a pregnant woman and her unborn baby. This test involves the use of radiation. Radiation exposure can be dangerous to a pregnant woman and her unborn baby. If you are pregnant or think you may be pregnant, you should not have this procedure done. A slight increase in the risk of cancer. This is because of the radiation involved in the test. The amount of radiation from one test is similar to the amount of radiation you are naturally exposed to over one year. What happens before the procedure? Ask your health care provider for any specific instructions on how to prepare for this procedure. You may be asked to avoid products that contain caffeine, tobacco, or nicotine for 4 hours before the procedure. What happens during the procedure?  You will undress and  remove any jewelry from your neck or chest. You may need to remove hearing aides and dentures. Women may need to remove their bras. You will put on a hospital gown. Sticky electrodes will be placed on your chest. The electrodes will be connected to an electrocardiogram (ECG) machine to record a tracing of the electrical activity of your heart. You will lie down on your back on a curved bed that is attached to the CT scanner. You may be given medicine to slow down your heart rate so that clear pictures can be created. You will be moved into the CT scanner, and the CT scanner will take pictures of your heart. During this time, you will be asked to lie still and hold your breath for 10-20 seconds at a time while each picture of your heart is being taken. The procedure may vary among health care providers and hospitals. What can I expect after the procedure? You can return to your normal activities. It is up to you to get the results of your procedure. Ask your health care provider, or the department that is doing the procedure, when your results will be ready. Summary A coronary calcium scan is an imaging test used to look for deposits of plaque in the inner lining of the blood vessels of the heart. Plaque is made up of calcium, protein, and fatty substances. A coronary calcium scan is performed using a CT scanner machine without contrast. Generally, this is a safe procedure. Tell your health care provider if you are pregnant or may be pregnant. Ask your health care provider for any specific instructions  on how to prepare for this procedure. You can return to your normal activities after the scan is done. This information is not intended to replace advice given to you by your health care provider. Make sure you discuss any questions you have with your health care provider. Document Revised: 03/23/2021 Document Reviewed: 03/23/2021 Elsevier Patient Education  2024 [employer].

## 2023-10-04 NOTE — Progress Notes (Signed)
 I,[name], CMA,acting as a Neurosurgeon for Smiley Dung, MD.,have documented all relevant documentation on the behalf of Smiley Dung, MD,as directed by  Smiley Dung, MD while in the presence of Smiley Dung, MD.  Subjective:  Patient ID: Rachel [name] , female    DOB: 1950-08-11 , 73 y.o.   MRN: [id number]  Chief Complaint  Patient presents with   Hypertension    Patient presents today for a bp and thyroid  follow up, Patient reports compliance with medication. Patient denies any chest pain, SOB, or headaches. Patient has no concerns today.     HPI Discussed the use of AI scribe software for clinical note transcription with the patient, who gave verbal consent to proceed.  History of Present Illness Rachel [name] is a 73 year old female with hypertension and thyroid  issues who presents for a blood pressure and thyroid  follow-up.  Her blood pressure is well-controlled with a recent reading of 116/78 mmHg. She is currently taking valsartan  80 mg and hydrochlorothiazide  for her blood pressure management. 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  She is on Synthroid  75 mcg daily for her thyroid  condition. She has no current thyroid -related symptoms and is due for a follow-up with her endocrinologist.  Her medication regimen includes simvastatin  for cholesterol, famotidine  twice daily for reflux associated with a hiatal hernia, and solifenacin (VESIcare) for bladder issues. She also takes Prolia  for bone health and vitamin D .  She has a history of a stable lung nodule, emphysema, and a hiatal hernia. She used to smoke, which is relevant to her emphysema diagnosis. A CT scan in 2022 showed no new lung nodule. No current problems or concerns were noted in the review of symptoms.   Hypertension This is a chronic problem. The current episode started more than 1 year ago. The problem  has been gradually improving since onset. The problem is controlled. Pertinent negatives include no blurred vision, chest pain or shortness of breath. Risk factors for coronary artery disease include dyslipidemia, obesity, post-menopausal state and sedentary lifestyle. The current treatment provides moderate improvement.     Past Medical History:  Diagnosis Date   Allergy 04/12/2004   CKD (chronic kidney disease), stage III [facility])    nephrologist--- dr l. foster   GERD (gastroesophageal reflux disease)    Glaucoma, right eye    Hiatal hernia    History of gastric ulcer    per egd 2019 non-bleeding   History of spontaneous subarachnoid intracranial hemorrhage due to cerebral aneurysm 03/2004   due to A-com region bilobed aneursym  s/p craniotomy with clipping;  per pt no residual and no issues since   Hypertension    followed by pcp   Hypertensive nephropathy    Hypothyroidism    endocrinologist--- dr Rosalea Collin   IDA (iron deficiency anemia)    Insomnia    Mastodynia of left breast    OAB (overactive bladder)    Osteoporosis    Renal calculus, left    Wears glasses    Wears partial dentures    lower     Family History  Problem Relation Age of Onset   Cancer Mother        Breast    Hypertension Mother    Cancer Father        Prostate   Hypertension Father    Cancer Sister        Breast  COPD Sister    Prostate cancer Brother    Healthy Son      Current Outpatient Medications:    calcium carbonate (TUMS - DOSED IN MG ELEMENTAL CALCIUM) 500 MG chewable tablet, Chew 4 tablets by mouth 2 (two) times daily as needed for indigestion or heartburn., Disp: , Rfl:    cholecalciferol (VITAMIN D3) 25 MCG (1000 UNIT) tablet, Take 1,000 Units by mouth daily., Disp: , Rfl:    COMBIGAN 0.2-0.5 % ophthalmic solution, Place 1 drop into the right eye 2 (two) times daily., Disp: , Rfl: 6   denosumab  (PROLIA ) 60 MG/ML SOSY injection, Inject 60 mg into the skin every 6 (six) months.  Courier to rheum: [address], [place] [address]. Appt on 07/01/2023. CALL PT TO CONFIRM PAYMENT INFO, Disp: 1 mL, Rfl: 0   famotidine  (PEPCID ) 40 MG tablet, Take 40 mg by mouth 2 (two) times daily., Disp: , Rfl:    Ferrous Gluconate (IRON 27 PO), Take 1 tablet by mouth at bedtime., Disp: , Rfl:    hydrochlorothiazide  (MICROZIDE ) 12.5 MG capsule, TAKE 1 CAPSULE BY MOUTH EVERY OTHER DAY, Disp: 45 capsule, Rfl: 1   Magnesium 250 MG TABS, Take 250 mg by mouth at bedtime., Disp: , Rfl:    nystatin-triamcinolone (MYCOLOG II) cream, SMARTSIG:Topical Morning-Evening, Disp: , Rfl:    simvastatin  (ZOCOR ) 40 MG tablet, TAKE 1 TABLET BY MOUTH EVERYDAY AT BEDTIME, Disp: 90 tablet, Rfl: 1   solifenacin (VESICARE) 10 MG tablet, Take 10 mg by mouth daily., Disp: , Rfl:    SYNTHROID  75 MCG tablet, Take 1 tablet (75 mcg total) by mouth daily before breakfast., Disp: 30 tablet, Rfl: 6   temazepam  (RESTORIL ) 15 MG capsule, TAKE 1 CAPSULE BY MOUTH EVERY DAY AT BEDTIME AS NEEDED, Disp: 30 capsule, Rfl: 1   valsartan  (DIOVAN ) 80 MG tablet, Take 80 mg by mouth daily., Disp: , Rfl:    Allergies  Allergen Reactions   Dilantin [Phenytoin Sodium Extended] Rash     Review of Systems  Constitutional: Negative.   Eyes:  Negative for blurred vision.  Respiratory: Negative.  Negative for shortness of breath.   Cardiovascular: Negative.  Negative for chest pain.  Gastrointestinal: Negative.   Neurological: Negative.   Psychiatric/Behavioral: Negative.       Today's Vitals   10/04/23 1500  BP: 116/78  Pulse: 81  Temp: 98.4 F (36.9 C)  TempSrc: Oral  Weight: 200 lb 12.8 oz (91.1 kg)  Height: 5\' 2"  (1.575 m)  PainSc: 0-No pain   Body mass index is 36.73 kg/m.  Wt Readings from Last 3 Encounters:  10/04/23 200 lb 12.8 oz (91.1 kg)  06/28/23 197 lb (89.4 kg)  06/03/23 203 lb (92.1 kg)    The 10-year ASCVD risk score (Arnett DK, et al., 2019) is: 10%   Values used to calculate the score:      Age: 50 years     Sex: Female     Is Non-Hispanic African American: Yes     Diabetic: No     Tobacco smoker: No     Systolic Blood Pressure: 116 mmHg     Is BP treated: Yes     HDL Cholesterol: 44 mg/dL     Total Cholesterol: 185 mg/dL  Objective:  Physical Exam Vitals and nursing note reviewed.  Constitutional:      Appearance: Normal appearance.  HENT:     Head: Normocephalic and atraumatic.  Eyes:     Extraocular Movements: Extraocular movements  intact.  Cardiovascular:     Rate and Rhythm: Normal rate and regular rhythm.     Heart sounds: Normal heart sounds.  Pulmonary:     Effort: Pulmonary effort is normal.     Breath sounds: Normal breath sounds.  Musculoskeletal:     Cervical back: Normal range of motion.  Skin:    General: Skin is warm.  Neurological:     General: No focal deficit present.     Mental Status: She is alert.  Psychiatric:        Mood and Affect: Mood normal.        Behavior: Behavior normal.       Assessment And Plan:  Hypertensive heart and renal disease with renal failure, stage 1 through stage 4 or unspecified chronic kidney disease, without heart failure Assessment & Plan: Chronic, controlled.  She will continue with valsartan  80mg  and  hydrochlorothiazide  12.5mg  every other day.  She is encouraged to gradually increase daily activity. She will f/u in four to six months for re-evaluation.  - Follow low sodium diet  Orders: -     CBC -     CMP14+EGFR  Stage 3a chronic kidney disease (HCC) Assessment & Plan: Chronic, she is encouraged to stay well hydrated, avoid NSAIDs and keep BP controlled to prevent progression of CKD.   - Nephrology input is appreciated - Most recent Nephrology notes reviewed   Aortic atherosclerosis [facility]) Assessment & Plan: Incidental finding on chest CT June 2022. Continue with statin therapy.  - Encouraged to consider cardiac calcium scoring, given information to read at her leisure - She is aware of $99  fee - Consider Lp(a) testing in the future - Not currently on ASA, has h/o SAH due to cerebral aneurysm   Acquired hypothyroidism Assessment & Plan: Chronic, also followed by Endo.  She will continue with Synthroid  75mcg daily.  - Most recent labs reviewed - Continue with management as per Dr. Rosalea Collin  Orders: -     TSH + free T4  Other abnormal glucose Assessment & Plan: Previous labs reviewed, her A1c has been elevated in the past. I will check an A1c today. Reminded to avoid refined sugars including sugary drinks/foods and processed meats including bacon, sausages and deli meats.    Orders: -     Hemoglobin A1c  COVID-19 vaccine administered -     Pfizer Comirnaty Covid-19 Vaccine 62yrs & older   Return for keep same next.  Patient was given opportunity to ask questions. Patient verbalized understanding of the plan and was able to repeat key elements of the plan. All questions were answered to their satisfaction.    I, Smiley Dung, MD, have reviewed all documentation for this visit. The documentation on 10/04/23 for the exam, diagnosis, procedures, and orders are all accurate and complete.   IF YOU HAVE BEEN REFERRED TO A SPECIALIST, IT MAY TAKE 1-2 WEEKS TO SCHEDULE/PROCESS THE REFERRAL. IF YOU HAVE NOT HEARD FROM US /SPECIALIST IN TWO WEEKS, PLEASE GIVE US  A CALL AT [phone].

## 2023-10-04 NOTE — Assessment & Plan Note (Signed)
 Chronic, she is encouraged to stay well hydrated, avoid NSAIDs and keep BP controlled to prevent progression of CKD.   - Nephrology input is appreciated - Most recent Nephrology notes reviewed

## 2023-10-04 NOTE — Assessment & Plan Note (Addendum)
 Incidental finding on chest CT June 2022. Continue with statin therapy.  - Encouraged to consider cardiac calcium scoring, given information to read at her leisure - She is aware of $99 fee - Consider Lp(a) testing in the future - Not currently on ASA, has h/o SAH due to cerebral aneurysm

## 2023-10-04 NOTE — Assessment & Plan Note (Signed)
 Chronic, controlled.  She will continue with valsartan  80mg  and  hydrochlorothiazide  12.5mg  every other day.  She is encouraged to gradually increase daily activity. She will f/u in four to six months for re-evaluation.  - Follow low sodium diet

## 2023-10-04 NOTE — Assessment & Plan Note (Signed)
 Chronic, also followed by Endo.  She will continue with Synthroid  75mcg daily.  - Most recent labs reviewed - Continue with management as per Dr. Rosalea Collin

## 2023-10-04 NOTE — Assessment & Plan Note (Signed)
 Previous labs reviewed, her A1c has been elevated in the past. I will check an A1c today. Reminded to avoid refined sugars including sugary drinks/foods and processed meats including bacon, sausages and deli meats.

## 2023-10-05 ENCOUNTER — Ambulatory Visit: Payer: Self-pay | Admitting: Internal Medicine

## 2023-10-05 LAB — CMP14+EGFR
ALT: 11 IU/L (ref 0–32)
AST: 18 IU/L (ref 0–40)
Albumin: 4.1 g/dL (ref 3.8–4.8)
Alkaline Phosphatase: 52 IU/L (ref 44–121)
BUN/Creatinine Ratio: 17 (ref 12–28)
BUN: 19 mg/dL (ref 8–27)
Bilirubin Total: 0.8 mg/dL (ref 0.0–1.2)
CO2: 23 mmol/L (ref 20–29)
Calcium: 9.5 mg/dL (ref 8.7–10.3)
Chloride: 105 mmol/L (ref 96–106)
Creatinine, Ser: 1.09 mg/dL — ABNORMAL HIGH (ref 0.57–1.00)
Globulin, Total: 3.2 g/dL (ref 1.5–4.5)
Glucose: 81 mg/dL (ref 70–99)
Potassium: 4.5 mmol/L (ref 3.5–5.2)
Sodium: 141 mmol/L (ref 134–144)
Total Protein: 7.3 g/dL (ref 6.0–8.5)
eGFR: 54 mL/min/{1.73_m2} — ABNORMAL LOW (ref >59)

## 2023-10-05 LAB — CBC
Hematocrit: 48.2 % — ABNORMAL HIGH (ref 34.0–46.6)
Hemoglobin: 16 g/dL — ABNORMAL HIGH (ref 11.1–15.9)
MCH: 33.4 pg — ABNORMAL HIGH (ref 26.6–33.0)
MCHC: 33.2 g/dL (ref 31.5–35.7)
MCV: 101 fL — ABNORMAL HIGH (ref 79–97)
Platelets: 242 10*3/uL (ref 150–450)
RBC: 4.79 x10E6/uL (ref 3.77–5.28)
RDW: 12.1 % (ref 11.7–15.4)
WBC: 6.2 10*3/uL (ref 3.4–10.8)

## 2023-10-05 LAB — TSH+FREE T4
Free T4: 1.41 ng/dL (ref 0.82–1.77)
TSH: 1.16 u[IU]/mL (ref 0.450–4.500)

## 2023-10-05 LAB — HEMOGLOBIN A1C
Est. average glucose Bld gHb Est-mCnc: 126 mg/dL
Hgb A1c MFr Bld: 6 % — ABNORMAL HIGH (ref 4.8–5.6)

## 2023-10-18 ENCOUNTER — Other Ambulatory Visit: Payer: Self-pay | Admitting: Internal Medicine

## 2023-10-18 DIAGNOSIS — D582 Other hemoglobinopathies: Secondary | ICD-10-CM

## 2023-10-19 ENCOUNTER — Other Ambulatory Visit: Payer: Self-pay

## 2023-10-19 DIAGNOSIS — D582 Other hemoglobinopathies: Secondary | ICD-10-CM

## 2023-10-19 LAB — CBC WITH DIFFERENTIAL/PLATELET
Basophils Absolute: 0 10*3/uL (ref 0.0–0.2)
Basos: 1 %
EOS (ABSOLUTE): 0.3 10*3/uL (ref 0.0–0.4)
Eos: 4 %
Hematocrit: 46.7 % — ABNORMAL HIGH (ref 34.0–46.6)
Hemoglobin: 14.8 g/dL (ref 11.1–15.9)
Immature Grans (Abs): 0 10*3/uL (ref 0.0–0.1)
Immature Granulocytes: 0 %
Lymphocytes Absolute: 1.8 10*3/uL (ref 0.7–3.1)
Lymphs: 27 %
MCH: 32.2 pg (ref 26.6–33.0)
MCHC: 31.7 g/dL (ref 31.5–35.7)
MCV: 102 fL — ABNORMAL HIGH (ref 79–97)
Monocytes Absolute: 0.9 10*3/uL (ref 0.1–0.9)
Monocytes: 14 %
Neutrophils Absolute: 3.6 10*3/uL (ref 1.4–7.0)
Neutrophils: 54 %
Platelets: 235 10*3/uL (ref 150–450)
RBC: 4.59 x10E6/uL (ref 3.77–5.28)
RDW: 12.6 % (ref 11.7–15.4)
WBC: 6.6 10*3/uL (ref 3.4–10.8)

## 2023-10-20 ENCOUNTER — Ambulatory Visit: Payer: Self-pay | Admitting: Internal Medicine

## 2023-11-01 ENCOUNTER — Ambulatory Visit (INDEPENDENT_AMBULATORY_CARE_PROVIDER_SITE_OTHER): Admitting: Internal Medicine

## 2023-11-01 ENCOUNTER — Encounter: Payer: Self-pay | Admitting: Internal Medicine

## 2023-11-01 VITALS — BP 110/72 | HR 68 | Ht 62.0 in | Wt 201.0 lb

## 2023-11-01 DIAGNOSIS — E039 Hypothyroidism, unspecified: Secondary | ICD-10-CM

## 2023-11-01 MED ORDER — SYNTHROID 75 MCG PO TABS: 75.0000 ug | ORAL_TABLET | Freq: Every day | ORAL | 3 refills | Status: DC

## 2023-11-01 NOTE — Patient Instructions (Signed)

## 2023-11-01 NOTE — Progress Notes (Signed)
 Name: Rachel [name]  MRN/ DOB: [id number], 1950-11-25    Age/ Sex: 73 y.o., female     PCP: Jarold Medici, MD   Reason for Endocrinology Evaluation: Hypothyroidism     Initial Endocrinology Clinic Visit: 08/16/2020    PATIENT IDENTIFIER: Rachel [name] is a 73 y.o., female with a past medical history of HTN and pulmonary fibrosis . She has followed with [facility] since 08/16/2020 for consultative assistance with management of her Hypothyroidism.   HISTORICAL SUMMARY:   She has been diagnosed with hypothyroidism years ago. Has been on Lt-4 ever since.    Sister with thyroid  disease  SUBJECTIVE:     Today (11/01/2023):  Rachel [name] is here for hypothyroidism.  Weight has been stable d Denies tremors or palpitations  Denies constipation or diarrhea  Denies local neck symptoms  No biotin intake    Synthroid  75 mcg daily     HISTORY:  Past Medical History:  Past Medical History:  Diagnosis Date   Allergy 04/12/2004   CKD (chronic kidney disease), stage III [facility])    nephrologist--- dr l. foster   GERD (gastroesophageal reflux disease)    Glaucoma, right eye    Hiatal hernia    History of gastric ulcer    per egd 2019 non-bleeding   History of spontaneous subarachnoid intracranial hemorrhage due to cerebral aneurysm 03/2004   due to A-com region bilobed aneursym  s/p craniotomy with clipping;  per pt no residual and no issues since   Hypertension    followed by pcp   Hypertensive nephropathy    Hypothyroidism    endocrinologist--- dr sam   IDA (iron deficiency anemia)    Insomnia    Mastodynia of left breast    OAB (overactive bladder)    Osteoporosis    Renal calculus, left    Wears glasses    Wears partial dentures    lower   Past Surgical History:  Past Surgical History:  Procedure Laterality Date   CEREBRAL ANEURYSM REPAIR  03/2004   @[facility]  by dr lauree   ;   craniotomy w/ clipping to A-com region for bilobed aneurysm  (right skull base)   CLOSED REDUCTION NASAL FRACTURE N/A 06/21/2020   Procedure: CLOSED REDUCTION NASAL FRACTURE;  Surgeon: Llewellyn Gerard LABOR, DO;  Location: [facility] OR;  Service: ENT;  Laterality: N/A;   COLONOSCOPY WITH ESOPHAGOGASTRODUODENOSCOPY (EGD)  03/16/2018   CYSTOSCOPY W/ URETERAL STENT PLACEMENT Left 11/13/2021   Procedure: CYSTOSCOPY WITH RETROGRADE PYELOGRAM/URETERAL STENT PLACEMENT;  Surgeon: Selma Donnice SAUNDERS, MD;  Location: [facility];  Service: Urology;  Laterality: Left;   CYSTOSCOPY/URETEROSCOPY/HOLMIUM LASER/STENT PLACEMENT Left 12/05/2021   Procedure: CYSTOSCOPY/ RETROGRADE/URETEROSCOPY/HOLMIUM LASER/STENT PLACEMENT;  Surgeon: Selma Donnice SAUNDERS, MD;  Location: [facility];  Service: Urology;  Laterality: Left;   LAPAROSCOPIC BILATERAL SALPINGO OOPHERECTOMY Bilateral 11/12/2015   Procedure: LAPAROSCOPIC BILATERAL SALPINGO OOPHORECTOMY;  Surgeon: Jon Rummer, MD;  Location: [facility];  Service: Gynecology;  Laterality: Bilateral;   LAPAROSCOPY N/A 11/22/2015   Procedure: LAPAROSCOPY DIAGNOSTIC with repair of serosal tear;  Surgeon: Jon Rummer, MD;  Location: [facility];  Service: Gynecology;  Laterality: N/A;   RADIOACTIVE SEED GUIDED EXCISIONAL BREAST BIOPSY Left 09/26/2015   Procedure: LEFT RADIOACTIVE SEED GUIDED EXCISIONAL BREAST BIOPSY;  Surgeon: Jina Nephew, MD;  Location: [facility] OR;  Service: General;  Laterality: Left;   RADIOACTIVE SEED GUIDED EXCISIONAL BREAST BIOPSY Left 02/20/2020   Procedure: LEFT RADIOACTIVE SEED GUIDED EXCISIONAL BREAST BIOPSY;  Surgeon: Nephew Jina, MD;  Location: [facility];  Service: General;  Laterality: Left;  RNFA   WISDOM TOOTH EXTRACTION     Social History:  reports that she quit smoking about 19 years ago. Her smoking use included cigarettes. She started smoking about 52 years ago. She has a 32.9 pack-year smoking history. She has been exposed to tobacco smoke. She has never used smokeless tobacco. She reports that she does  not drink alcohol and does not use drugs. Family History:  Family History  Problem Relation Age of Onset   Cancer Mother        Breast    Hypertension Mother    Cancer Father        Prostate   Hypertension Father    Cancer Sister        Breast   COPD Sister    Prostate cancer Brother    Healthy Son      HOME MEDICATIONS: Allergies as of 11/01/2023       Reactions   Dilantin [phenytoin Sodium Extended] Rash        Medication List        Accurate as of November 01, 2023 12:18 PM. If you have any questions, ask your nurse or doctor.          calcium carbonate 500 MG chewable tablet Commonly known as: TUMS - dosed in mg elemental calcium Chew 4 tablets by mouth 2 (two) times daily as needed for indigestion or heartburn.   cholecalciferol 25 MCG (1000 UNIT) tablet Commonly known as: VITAMIN D3 Take 1,000 Units by mouth daily.   Combigan 0.2-0.5 % ophthalmic solution Generic drug: brimonidine-timolol Place 1 drop into the right eye 2 (two) times daily.   famotidine  40 MG tablet Commonly known as: PEPCID  Take 40 mg by mouth 2 (two) times daily.   hydrochlorothiazide  12.5 MG capsule Commonly known as: MICROZIDE  TAKE 1 CAPSULE BY MOUTH EVERY OTHER DAY   IRON 27 PO Take 1 tablet by mouth at bedtime.   Magnesium 250 MG Tabs Take 250 mg by mouth at bedtime.   nystatin-triamcinolone cream Commonly known as: MYCOLOG II SMARTSIG:Topical Morning-Evening   Prolia  60 MG/ML Sosy injection Generic drug: denosumab  Inject 60 mg into the skin every 6 (six) months. Courier to rheum: [address], [place] [address]. Appt on 07/01/2023. CALL PT TO CONFIRM PAYMENT INFO   simvastatin  40 MG tablet Commonly known as: ZOCOR  TAKE 1 TABLET BY MOUTH EVERYDAY AT BEDTIME   solifenacin 10 MG tablet Commonly known as: VESICARE Take 10 mg by mouth daily.   Synthroid  75 MCG tablet Generic drug: levothyroxine  Take 1 tablet (75 mcg total) by mouth daily before  breakfast.   temazepam  15 MG capsule Commonly known as: RESTORIL  TAKE 1 CAPSULE BY MOUTH EVERY DAY AT BEDTIME AS NEEDED   valsartan  80 MG tablet Commonly known as: DIOVAN  Take 80 mg by mouth daily.          OBJECTIVE:   PHYSICAL EXAM: VS: BP 110/72 (BP Location: Left Arm, Patient Position: Sitting, Cuff Size: Normal)   Pulse 68   Ht 5' 2 (1.575 m)   Wt 201 lb (91.2 kg)   SpO2 97%   BMI 36.76 kg/m    EXAM: General: Pt appears well and is in NAD  Neck: General: Supple without adenopathy. Thyroid : Thyroid  size normal.  No goiter or nodules appreciated.   Lungs: Clear with good BS bilat   Heart: Auscultation: RRR.  Extremities:  BL LE: No pretibial edema  Mental Status: Judgment, insight: Intact Orientation: Oriented to time, place, and person Memory: Intact for recent and remote events Mood and affect: No depression, anxiety, or agitation     DATA REVIEWED:  10/04/2023 TSH 1.60 uIU/mL   ASSESSMENT / PLAN / RECOMMENDATIONS:   Hypothyroidism:   - Pt is cliniclaly euthyroid - NO local neck symptoms  - Recent labs done at PCPs office show normalization of TSH, no change at this time   Medications   Continue Synthroid  75 mcg daily  F/U in 6 months    Signed electronically by: Stefano Redgie Butts, MD  [facility] Endocrinology  [facility] [address] [place] [place], [place] [address] Phone: [phone] FAX: [phone]      CC: Jarold Medici, MD [address] [place] [address] Phone: [phone]  Fax: [phone]   Return to Endocrinology clinic as below: Future Appointments  Date Time Provider Department Center  04/06/2024  3:00 PM Jarold Medici, MD [facility] None  06/01/2024  1:00 PM Dolphus Reiter, MD [facility] None  06/14/2024  2:00 PM [facility]-ANNUAL WELLNESS VISIT [facility] None  06/14/2024  2:40 PM Jarold Medici, MD [facility] None

## 2023-11-02 ENCOUNTER — Encounter: Payer: Self-pay | Admitting: Internal Medicine

## 2023-11-14 ENCOUNTER — Other Ambulatory Visit: Payer: Self-pay | Admitting: Internal Medicine

## 2023-11-14 DIAGNOSIS — I700 Atherosclerosis of aorta: Secondary | ICD-10-CM

## 2023-11-14 DIAGNOSIS — E7800 Pure hypercholesterolemia, unspecified: Secondary | ICD-10-CM

## 2023-12-14 LAB — HM MAMMOGRAPHY

## 2023-12-16 ENCOUNTER — Encounter: Payer: Self-pay | Admitting: Internal Medicine

## 2023-12-21 ENCOUNTER — Other Ambulatory Visit: Payer: Self-pay

## 2023-12-22 ENCOUNTER — Other Ambulatory Visit: Payer: Self-pay | Admitting: Internal Medicine

## 2023-12-29 ENCOUNTER — Telehealth: Payer: Self-pay

## 2023-12-29 ENCOUNTER — Other Ambulatory Visit (HOSPITAL_COMMUNITY): Payer: Self-pay

## 2023-12-29 ENCOUNTER — Other Ambulatory Visit: Payer: Self-pay

## 2023-12-29 ENCOUNTER — Other Ambulatory Visit: Payer: Self-pay | Admitting: Pharmacy Technician

## 2023-12-29 DIAGNOSIS — M8589 Other specified disorders of bone density and structure, multiple sites: Secondary | ICD-10-CM

## 2023-12-29 DIAGNOSIS — Z5181 Encounter for therapeutic drug level monitoring: Secondary | ICD-10-CM

## 2023-12-29 MED ORDER — DENOSUMAB 60 MG/ML ~~LOC~~ SOSY
60.0000 mg | PREFILLED_SYRINGE | Freq: Once | SUBCUTANEOUS | Status: AC
  Administered 2024-01-03: 60 mg via SUBCUTANEOUS

## 2023-12-29 MED ORDER — PROLIA 60 MG/ML ~~LOC~~ SOSY
60.0000 mg | PREFILLED_SYRINGE | SUBCUTANEOUS | 0 refills | Status: AC
  Filled 2023-12-29: qty 1, 180d supply, fill #0

## 2023-12-29 NOTE — Progress Notes (Signed)
 Specialty Pharmacy Refill Coordination Note  Rachel [name] is a 73 y.o. female assessed today regarding refills of clinic administered specialty medication(s) Denosumab  (Prolia )   Clinic requested Courier to Provider Office   Delivery date: 12/30/23   Verified address: [facility] [address] [place] [address]   Medication will be filled on 12/29/23.  Co-pay $155.00 confirmed per chart: 9.3

## 2023-12-29 NOTE — Telephone Encounter (Signed)
 Patient due for Prolia  on 9/2. Will need updated labs (CMP & Vitamin D ). Per test claim, copay is $155 through pharmacy benefit. Called patient and she states she will come in tomorrow for labs and is okay with the $155 copay. Will follow up with patient once lab results are back.

## 2023-12-30 ENCOUNTER — Other Ambulatory Visit: Payer: Self-pay

## 2023-12-30 ENCOUNTER — Other Ambulatory Visit: Payer: Self-pay | Admitting: *Deleted

## 2023-12-30 DIAGNOSIS — M8589 Other specified disorders of bone density and structure, multiple sites: Secondary | ICD-10-CM

## 2023-12-30 DIAGNOSIS — Z5181 Encounter for therapeutic drug level monitoring: Secondary | ICD-10-CM

## 2023-12-30 LAB — COMPREHENSIVE METABOLIC PANEL WITH GFR
AG Ratio: 1.2 (calc) (ref 1.0–2.5)
ALT: 10 U/L (ref 6–29)
AST: 16 U/L (ref 10–35)
Albumin: 4 g/dL (ref 3.6–5.1)
Alkaline phosphatase (APISO): 47 U/L (ref 37–153)
BUN/Creatinine Ratio: 19 (calc) (ref 6–22)
BUN: 22 mg/dL (ref 7–25)
CO2: 28 mmol/L (ref 20–32)
Calcium: 10.1 mg/dL (ref 8.6–10.4)
Chloride: 104 mmol/L (ref 98–110)
Creat: 1.18 mg/dL — ABNORMAL HIGH (ref 0.60–1.00)
Globulin: 3.4 g/dL (ref 1.9–3.7)
Glucose, Bld: 91 mg/dL (ref 65–99)
Potassium: 4 mmol/L (ref 3.5–5.3)
Sodium: 138 mmol/L (ref 135–146)
Total Bilirubin: 0.9 mg/dL (ref 0.2–1.2)
Total Protein: 7.4 g/dL (ref 6.1–8.1)
eGFR: 49 mL/min/1.73m2 — ABNORMAL LOW (ref >=60)

## 2023-12-31 ENCOUNTER — Ambulatory Visit: Payer: Self-pay | Admitting: Rheumatology

## 2023-12-31 NOTE — Progress Notes (Signed)
 Creatinine is mildly elevated and stable most likely due to diuretic use.  Please forward results to her PCP.

## 2023-12-31 NOTE — Telephone Encounter (Signed)
 Patient scheduled for Prolia  on 01/03/2024. She denies any upcoming surgeries  Sherry Pennant, PharmD, MPH, BCPS, CPP Clinical Pharmacist (Rheumatology and Pulmonology)

## 2024-01-03 ENCOUNTER — Ambulatory Visit: Attending: Rheumatology | Admitting: Pharmacist

## 2024-01-03 DIAGNOSIS — Z7689 Persons encountering health services in other specified circumstances: Secondary | ICD-10-CM

## 2024-01-03 DIAGNOSIS — M8589 Other specified disorders of bone density and structure, multiple sites: Secondary | ICD-10-CM | POA: Diagnosis not present

## 2024-01-03 MED ORDER — DENOSUMAB 60 MG/ML ~~LOC~~ SOSY: 60.0000 mg | PREFILLED_SYRINGE | SUBCUTANEOUS | Status: AC

## 2024-01-03 NOTE — Progress Notes (Signed)
 Pharmacy Note  Subjective:   Patient presents to clinic today to receive bi-annual dose of Prolia . Patient's last dose of Prolia  was on 07/01/2023  Patient running a fever or have signs/symptoms of infection? No  Patient currently on antibiotics for the treatment of infection? No  Patient had fall in the last 6 months?  No    Patient taking calcium 1200 mg daily through diet or supplement and at least 800 units vitamin D ? Yes  Objective: CMP     Component Value Date/Time   NA 138 12/30/2023 1230   NA 141 10/04/2023 1559   K 4.0 12/30/2023 1230   CL 104 12/30/2023 1230   CO2 28 12/30/2023 1230   GLUCOSE 91 12/30/2023 1230   BUN 22 12/30/2023 1230   BUN 19 10/04/2023 1559   CREATININE 1.18 (H) 12/30/2023 1230   CALCIUM 10.1 12/30/2023 1230   PROT 7.4 12/30/2023 1230   PROT 7.3 10/04/2023 1559   ALBUMIN 4.1 10/04/2023 1559   AST 16 12/30/2023 1230   ALT 10 12/30/2023 1230   ALKPHOS 52 10/04/2023 1559   BILITOT 0.9 12/30/2023 1230   BILITOT 0.8 10/04/2023 1559   GFRNONAA 47 (L) 11/13/2021 1430   GFRAA 44 (L) 08/29/2019 1523    CBC    Component Value Date/Time   WBC 6.6 10/19/2023 0959   WBC 5.6 11/13/2021 1353   RBC 4.59 10/19/2023 0959   RBC 4.28 11/13/2021 1353   HGB 14.8 10/19/2023 0959   HCT 46.7 (H) 10/19/2023 0959   PLT 235 10/19/2023 0959   MCV 102 (H) 10/19/2023 0959   MCH 32.2 10/19/2023 0959   MCH 32.7 11/13/2021 1353   MCHC 31.7 10/19/2023 0959   MCHC 31.9 11/13/2021 1353   RDW 12.6 10/19/2023 0959   LYMPHSABS 1.8 10/19/2023 0959   MONOABS 0.6 11/13/2021 1353   EOSABS 0.3 10/19/2023 0959   BASOSABS 0.0 10/19/2023 0959    Lab Results  Component Value Date   VD25OH 51 06/03/2023    T-score: 06/24/2021: Left femoral neck BMD 0.717 with T-score -2.1. Statistically significant decrease in BMD of AP spine and right hip   Assessment/Plan:   Reviewed importance of adequate dietary intake of calcium in addition to supplementation due to risk of  hypocalcemia with Prolia .   Patient tolerated injection well.   Administration details as below: Administrations This Visit     denosumab  (PROLIA ) injection 60 mg     Admin Date 01/03/2024 Action Given Dose 60 mg Route Subcutaneous Documented By Rahma Meller S, RPH-CPP            Patient's next Prolia  dose is due on 07/01/2024.  Patient is due for updated DEXA in Dec 2025.   All questions encouraged and answered.  Instructed patient to call with any further questions or concerns.   Sherry Pennant, PharmD, MPH, BCPS, CPP Clinical Pharmacist [facility])

## 2024-02-10 ENCOUNTER — Other Ambulatory Visit: Payer: Self-pay | Admitting: Nephrology

## 2024-02-10 DIAGNOSIS — N1831 Chronic kidney disease, stage 3a: Secondary | ICD-10-CM

## 2024-02-10 DIAGNOSIS — N200 Calculus of kidney: Secondary | ICD-10-CM

## 2024-02-16 ENCOUNTER — Ambulatory Visit
Admission: RE | Admit: 2024-02-16 | Discharge: 2024-02-16 | Disposition: A | Discharge status: Home / Self Care | Source: Ambulatory Visit | Attending: Nephrology | Admitting: Nephrology

## 2024-02-16 DIAGNOSIS — N200 Calculus of kidney: Secondary | ICD-10-CM

## 2024-02-16 DIAGNOSIS — N1831 Chronic kidney disease, stage 3a: Secondary | ICD-10-CM

## 2024-02-28 ENCOUNTER — Encounter: Payer: Self-pay | Admitting: Radiology

## 2024-03-03 ENCOUNTER — Other Ambulatory Visit: Payer: Self-pay | Admitting: Internal Medicine

## 2024-03-03 DIAGNOSIS — F5101 Primary insomnia: Secondary | ICD-10-CM

## 2024-03-15 ENCOUNTER — Ambulatory Visit: Payer: Self-pay | Admitting: Family Medicine

## 2024-03-15 ENCOUNTER — Encounter: Payer: Self-pay | Admitting: Family Medicine

## 2024-03-15 VITALS — BP 120/64 | HR 92 | Temp 97.9°F | Ht 62.0 in | Wt 202.0 lb

## 2024-03-15 DIAGNOSIS — R350 Frequency of micturition: Secondary | ICD-10-CM | POA: Diagnosis not present

## 2024-03-15 DIAGNOSIS — Z6836 Body mass index (BMI) 36.0-36.9, adult: Secondary | ICD-10-CM | POA: Diagnosis not present

## 2024-03-15 DIAGNOSIS — N3001 Acute cystitis with hematuria: Secondary | ICD-10-CM | POA: Diagnosis not present

## 2024-03-15 DIAGNOSIS — E66812 Obesity, class 2: Secondary | ICD-10-CM | POA: Diagnosis not present

## 2024-03-15 LAB — POCT URINALYSIS DIP (CLINITEK)
Bilirubin, UA: NEGATIVE
Glucose, UA: NEGATIVE mg/dL
Ketones, POC UA: NEGATIVE mg/dL
Nitrite, UA: POSITIVE — AB
POC PROTEIN,UA: >=300 — AB
Spec Grav, UA: 1.02 (ref 1.010–1.025)
Urobilinogen, UA: 0.2 U/dL
pH, UA: 5.5 (ref 5.0–8.0)

## 2024-03-15 MED ORDER — CEPHALEXIN 250 MG PO CAPS: 250.0000 mg | ORAL_CAPSULE | Freq: Two times a day (BID) | ORAL | 0 refills | Status: AC

## 2024-03-15 NOTE — Progress Notes (Signed)
 I,[name], CMA,acting as a neurosurgeon for [employer], NP.,have documented all relevant documentation on the behalf of Bruna Creighton, NP,as directed by  Bruna Creighton, NP while in the presence of Bruna Creighton, NP.  Subjective:  Patient ID: Rachel [name] , female    DOB: October 06, 1950 , 73 y.o.   MRN: [id number]  Chief Complaint  Patient presents with   Dysuria    Patient presents today for frequent urination. Patient reports her symptoms started on Sunday. Patient also reports having like an uncomfortable feeling after urinating.     HPI Discussed the use of AI scribe software for clinical note transcription with the patient, who gave verbal consent to proceed.  History of Present Illness     Rachel [name] is a 73 year old female who presents with discomfort and frequent urination.  She has been experiencing frequent urination and an uncomfortable sensation at the end of urination since Sunday. The sensation is difficult to describe, but there is no burning. There is urgency but no episodes of incontinence. Her urine appears cloudy.  The patient reports that a urine test was performed and she was told it was positive for a urinary tract infection (UTI). She denies a history of frequent UTIs, stating she has not had many in her life. No visible blood in her urine, fever, or pain. She is not diabetic and has no known allergies to antibiotics.  No burning sensation during urination, incontinence, smelly urine, visible blood in urine, fever, or pain.      Past Medical History:  Diagnosis Date   Allergy 04/12/2004   CKD (chronic kidney disease), stage III [facility])    nephrologist--- dr l. foster   GERD (gastroesophageal reflux disease)    Glaucoma, right eye    Hiatal hernia    History of gastric ulcer    per egd 2019 non-bleeding   History of spontaneous subarachnoid intracranial hemorrhage due to cerebral aneurysm 03/2004   due to A-com region bilobed aneursym  s/p craniotomy  with clipping;  per pt no residual and no issues since   Hypertension    followed by pcp   Hypertensive nephropathy    Hypothyroidism    endocrinologist--- dr sam   IDA (iron deficiency anemia)    Insomnia    Mastodynia of left breast    OAB (overactive bladder)    Osteoporosis    Renal calculus, left    Wears glasses    Wears partial dentures    lower     Family History  Problem Relation Age of Onset   Cancer Mother        Breast    Hypertension Mother    Cancer Father        Prostate   Hypertension Father    Cancer Sister        Breast   COPD Sister    Prostate cancer Brother    Healthy Son      Current Outpatient Medications:    calcium carbonate (TUMS - DOSED IN MG ELEMENTAL CALCIUM) 500 MG chewable tablet, Chew 4 tablets by mouth 2 (two) times daily as needed for indigestion or heartburn., Disp: , Rfl:    cholecalciferol (VITAMIN D3) 25 MCG (1000 UNIT) tablet, Take 1,000 Units by mouth daily., Disp: , Rfl:    COMBIGAN 0.2-0.5 % ophthalmic solution, Place 1 drop into the right eye 2 (two) times daily., Disp: , Rfl: 6   denosumab  (PROLIA ) 60 MG/ML SOSY injection, Inject 60 mg into  the skin every 6 (six) months. Courier to rheum: [address], [place] [address]. Appt on 9/10//2025. CALL PT TO CONFIRM PAYMENT INFO, Disp: 1 mL, Rfl: 0   famotidine  (PEPCID ) 40 MG tablet, Take 40 mg by mouth 2 (two) times daily., Disp: , Rfl:    Ferrous Gluconate (IRON 27 PO), Take 1 tablet by mouth at bedtime., Disp: , Rfl:    hydrochlorothiazide  (MICROZIDE ) 12.5 MG capsule, TAKE 1 CAPSULE BY MOUTH EVERY OTHER DAY, Disp: 45 capsule, Rfl: 1   Magnesium 250 MG TABS, Take 250 mg by mouth at bedtime., Disp: , Rfl:    nystatin-triamcinolone (MYCOLOG II) cream, SMARTSIG:Topical Morning-Evening, Disp: , Rfl:    simvastatin  (ZOCOR ) 40 MG tablet, TAKE 1 TABLET BY MOUTH EVERYDAY AT BEDTIME, Disp: 90 tablet, Rfl: 1   solifenacin (VESICARE) 10 MG tablet, Take 10 mg by mouth  daily., Disp: , Rfl:    SYNTHROID  75 MCG tablet, Take 1 tablet (75 mcg total) by mouth daily before breakfast., Disp: 90 tablet, Rfl: 3   temazepam  (RESTORIL ) 15 MG capsule, TAKE 1 CAPSULE BY MOUTH EVERY DAY AT BEDTIME AS NEEDED, Disp: 30 capsule, Rfl: 1   valsartan  (DIOVAN ) 80 MG tablet, Take 80 mg by mouth daily., Disp: , Rfl:   Current Facility-Administered Medications:    [START ON 07/01/2024] denosumab  (PROLIA ) injection 60 mg, 60 mg, Subcutaneous, Q6 months,    Allergies  Allergen Reactions   Dilantin [Phenytoin Sodium Extended] Rash     Review of Systems  Constitutional: Negative.  Negative for fever.  Respiratory: Negative.    Cardiovascular: Negative.   Gastrointestinal:  Positive for abdominal pain.  Genitourinary:  Positive for urgency and vaginal discharge.  Skin: Negative.   Psychiatric/Behavioral: Negative.  Negative for agitation and behavioral problems.      Today's Vitals   03/15/24 1105  BP: 120/64  Pulse: 92  Temp: 97.9 F (36.6 C)  TempSrc: Oral  Weight: 202 lb (91.6 kg)  Height: 5' 2 (1.575 m)  PainSc: 0-No pain   Body mass index is 36.95 kg/m.  Wt Readings from Last 3 Encounters:  03/15/24 202 lb (91.6 kg)  11/01/23 201 lb (91.2 kg)  10/04/23 200 lb 12.8 oz (91.1 kg)    The 10-year ASCVD risk score (Arnett DK, et al., 2019) is: 10.6%   Values used to calculate the score:     Age: 6 years     Clincally relevant sex: Female     Is Non-Hispanic African American: Yes     Diabetic: No     Tobacco smoker: No     Systolic Blood Pressure: 120 mmHg     Is BP treated: Yes     HDL Cholesterol: 44 mg/dL     Total Cholesterol: 185 mg/dL  Objective:  Physical Exam Constitutional:      Appearance: Normal appearance.  Cardiovascular:     Rate and Rhythm: Normal rate and regular rhythm.     Pulses: Normal pulses.     Heart sounds: Normal heart sounds.  Pulmonary:     Effort: Pulmonary effort is normal.     Breath sounds: Normal breath sounds.   Abdominal:     General: Bowel sounds are normal.     Tenderness: There is no right CVA tenderness, left CVA tenderness or guarding.  Neurological:     Mental Status: She is alert.         Assessment And Plan:   Assessment & Plan Frequent urination Acute cystitis confirmed by  positive urine test. Symptoms include frequent urination, urgency, and cloudy urine. No recurrent UTIs. Acute cystitis with hematuria - Prescribed antibiotics for 7 days considering kidney function. - Advised hydration and increased water  intake. - Instructed on proper hygiene to prevent infections. Class 2 severe obesity due to excess calories with serious comorbidity and body mass index (BMI) of 36.0 to 36.9 in adult She is encouraged to strive for BMI less than 30 to decrease cardiac risk. Advised to aim for at least 150 minutes of exercise per week.      Return if symptoms worsen or fail to improve, for keep next appt.  Patient was given opportunity to ask questions. Patient verbalized understanding of the plan and was able to repeat key elements of the plan. All questions were answered to their satisfaction.   I, Bruna Creighton, NP, have reviewed all documentation for this visit. The documentation on 03/26/2024 for the exam, diagnosis, procedures, and orders are all accurate and complete.   IF YOU HAVE BEEN REFERRED TO A SPECIALIST, IT MAY TAKE 1-2 WEEKS TO SCHEDULE/PROCESS THE REFERRAL. IF YOU HAVE NOT HEARD FROM US /SPECIALIST IN TWO WEEKS, PLEASE GIVE US  A CALL AT [phone].

## 2024-03-27 DIAGNOSIS — N3001 Acute cystitis with hematuria: Secondary | ICD-10-CM | POA: Insufficient documentation

## 2024-03-27 DIAGNOSIS — R350 Frequency of micturition: Secondary | ICD-10-CM | POA: Insufficient documentation

## 2024-03-27 NOTE — Assessment & Plan Note (Signed)
 Acute cystitis confirmed by positive urine test. Symptoms include frequent urination, urgency, and cloudy urine. No recurrent UTIs.

## 2024-03-27 NOTE — Assessment & Plan Note (Signed)
 She is encouraged to strive for BMI less than 30 to decrease cardiac risk. Advised to aim for at least 150 minutes of exercise per week.

## 2024-03-27 NOTE — Assessment & Plan Note (Signed)
-   Prescribed antibiotics for 7 days considering kidney function. - Advised hydration and increased water  intake. - Instructed on proper hygiene to prevent infections.

## 2024-04-06 ENCOUNTER — Ambulatory Visit: Payer: Medicare HMO | Admitting: Internal Medicine

## 2024-04-06 ENCOUNTER — Encounter: Payer: Self-pay | Admitting: Internal Medicine

## 2024-04-06 VITALS — BP 118/74 | HR 95 | Temp 98.3°F | Ht 62.0 in | Wt 203.6 lb

## 2024-04-06 DIAGNOSIS — E7800 Pure hypercholesterolemia, unspecified: Secondary | ICD-10-CM | POA: Insufficient documentation

## 2024-04-06 DIAGNOSIS — E039 Hypothyroidism, unspecified: Secondary | ICD-10-CM

## 2024-04-06 DIAGNOSIS — I700 Atherosclerosis of aorta: Secondary | ICD-10-CM

## 2024-04-06 DIAGNOSIS — F5101 Primary insomnia: Secondary | ICD-10-CM

## 2024-04-06 DIAGNOSIS — R718 Other abnormality of red blood cells: Secondary | ICD-10-CM

## 2024-04-06 DIAGNOSIS — Z0000 Encounter for general adult medical examination without abnormal findings: Secondary | ICD-10-CM

## 2024-04-06 DIAGNOSIS — I1310 Hypertensive heart and chronic kidney disease without heart failure, with stage 1 through stage 4 chronic kidney disease, or unspecified chronic kidney disease: Secondary | ICD-10-CM | POA: Insufficient documentation

## 2024-04-06 DIAGNOSIS — R7309 Other abnormal glucose: Secondary | ICD-10-CM

## 2024-04-06 DIAGNOSIS — N1831 Chronic kidney disease, stage 3a: Secondary | ICD-10-CM

## 2024-04-06 NOTE — Patient Instructions (Signed)

## 2024-04-06 NOTE — Progress Notes (Unsigned)
 I,Victoria T Emmitt, CMA,acting as a neurosurgeon for Catheryn LOISE Slocumb, MD.,have documented all relevant documentation on the behalf of Catheryn LOISE Slocumb, MD,as directed by  Catheryn LOISE Slocumb, MD while in the presence of Catheryn LOISE Slocumb, MD.  Subjective:    Patient ID: Rachel [name] , female    DOB: Jul 22, 1950 , 73 y.o.   MRN: [id number]  Chief Complaint  Patient presents with   Annual Exam    Patient presents today for annual exam. She reports compliance with medications. Denies headache, chest pain & sob.  GYN:  Dr. Henry   Hypertension   Hyperlipidemia   Hypothyroidism    HPI  Hypertension This is a chronic problem. The current episode started more than 1 year ago. The problem has been gradually improving since onset. The problem is controlled. Pertinent negatives include no blurred vision. Risk factors for coronary artery disease include dyslipidemia, obesity, post-menopausal state and sedentary lifestyle. The current treatment provides moderate improvement. Identifiable causes of hypertension include a thyroid  problem.  Thyroid  Problem Presents for follow-up visit. Patient reports no visual change. The symptoms have been stable.     Past Medical History:  Diagnosis Date   Allergy 04/12/2004   CKD (chronic kidney disease), stage III [facility])    nephrologist--- dr l. foster   GERD (gastroesophageal reflux disease)    Glaucoma, right eye    Hiatal hernia    History of gastric ulcer    per egd 2019 non-bleeding   History of spontaneous subarachnoid intracranial hemorrhage due to cerebral aneurysm 03/2004   due to A-com region bilobed aneursym  s/p craniotomy with clipping;  per pt no residual and no issues since   Hypertension    followed by pcp   Hypertensive nephropathy    Hypothyroidism    endocrinologist--- dr sam   IDA (iron deficiency anemia)    Insomnia    Mastodynia of left breast    OAB (overactive bladder)    Osteoporosis    Renal calculus, left    Wears  glasses    Wears partial dentures    lower     Family History  Problem Relation Age of Onset   Cancer Mother        Breast    Hypertension Mother    Cancer Father        Prostate   Hypertension Father    Cancer Sister        Breast   COPD Sister    Prostate cancer Brother    Healthy Son     Current Medications[1]   Allergies[2]    The patient states she uses post menopausal status for birth control. No LMP recorded. Patient is postmenopausal.. Negative for Dysmenorrhea. Negative for: breast discharge, breast lump(s), breast pain and breast self exam. Associated symptoms include abnormal vaginal bleeding. Pertinent negatives include abnormal bleeding (hematology), anxiety, decreased libido, depression, difficulty falling sleep, dyspareunia, history of infertility, nocturia, sexual dysfunction, sleep disturbances, urinary incontinence, urinary urgency, vaginal discharge and vaginal itching. Diet regular.The patient states her exercise level is    . The patient's tobacco use is: Tobacco Use History[3]. She has been exposed to passive smoke. The patient's alcohol use is:  Social History   Substance and Sexual Activity  Alcohol Use No   Review of Systems  Constitutional: Negative.   HENT: Negative.    Eyes: Negative.  Negative for blurred vision.  Respiratory: Negative.    Cardiovascular: Negative.   Gastrointestinal: Negative.   Endocrine: Negative.  Genitourinary: Negative.   Musculoskeletal: Negative.   Skin: Negative.   Allergic/Immunologic: Negative.   Neurological: Negative.   Hematological: Negative.   Psychiatric/Behavioral: Negative.       Today's Vitals   04/06/24 1512  BP: 118/74  Pulse: 95  Temp: 98.3 F (36.8 C)  SpO2: 98%  Weight: 203 lb 9.6 oz (92.4 kg)  Height: 5' 2 (1.575 m)   Body mass index is 37.24 kg/m.  Wt Readings from Last 3 Encounters:  04/06/24 203 lb 9.6 oz (92.4 kg)  03/15/24 202 lb (91.6 kg)  11/01/23 201 lb (91.2 kg)      Objective:  Physical Exam      Assessment And Plan:     Encounter for annual physical exam  Hypertensive heart and renal disease with renal failure, stage 1 through stage 4 or unspecified chronic kidney disease, without heart failure -     CBC -     Lipid panel -     Microalbumin / creatinine urine ratio -     EKG 12-Lead -     BMP8+EGFR  Aortic atherosclerosis  Stage 3a chronic kidney disease (HCC)  Pure hypercholesterolemia  Acquired hypothyroidism  Primary insomnia  Other abnormal glucose -     Hemoglobin A1c -     BMP8+EGFR  Elevated MCV -     Vitamin B12 -     Folate  Assessment and Plan Assessment & Plan      Return for 1 year HM, 6 month chol & bpc. Patient was given opportunity to ask questions. Patient verbalized understanding of the plan and was able to repeat key elements of the plan. All questions were answered to their satisfaction.   I, Catheryn LOISE Slocumb, MD, have reviewed all documentation for this visit. The documentation on 04/06/2024 for the exam, diagnosis, procedures, and orders are all accurate and complete.     [1]  Current Outpatient Medications:    calcium carbonate (TUMS - DOSED IN MG ELEMENTAL CALCIUM) 500 MG chewable tablet, Chew 4 tablets by mouth 2 (two) times daily as needed for indigestion or heartburn., Disp: , Rfl:    cholecalciferol (VITAMIN D3) 25 MCG (1000 UNIT) tablet, Take 1,000 Units by mouth daily., Disp: , Rfl:    COMBIGAN 0.2-0.5 % ophthalmic solution, Place 1 drop into the right eye 2 (two) times daily., Disp: , Rfl: 6   denosumab  (PROLIA ) 60 MG/ML SOSY injection, Inject 60 mg into the skin every 6 (six) months. Courier to rheum: [address], [address], [place] [address]. Appt on 9/10//2025. CALL PT TO CONFIRM PAYMENT INFO, Disp: 1 mL, Rfl: 0   famotidine  (PEPCID ) 40 MG tablet, Take 40 mg by mouth 2 (two) times daily., Disp: , Rfl:    Ferrous Gluconate (IRON 27 PO), Take 1 tablet by mouth at bedtime., Disp: ,  Rfl:    hydrochlorothiazide  (MICROZIDE ) 12.5 MG capsule, TAKE 1 CAPSULE BY MOUTH EVERY OTHER DAY, Disp: 45 capsule, Rfl: 1   Magnesium 250 MG TABS, Take 250 mg by mouth at bedtime., Disp: , Rfl:    nystatin-triamcinolone (MYCOLOG II) cream, SMARTSIG:Topical Morning-Evening, Disp: , Rfl:    simvastatin  (ZOCOR ) 40 MG tablet, TAKE 1 TABLET BY MOUTH EVERYDAY AT BEDTIME, Disp: 90 tablet, Rfl: 1   solifenacin (VESICARE) 10 MG tablet, Take 10 mg by mouth daily., Disp: , Rfl:    SYNTHROID  75 MCG tablet, Take 1 tablet (75 mcg total) by mouth daily before breakfast., Disp: 90 tablet, Rfl: 3   temazepam  (RESTORIL )  15 MG capsule, TAKE 1 CAPSULE BY MOUTH EVERY DAY AT BEDTIME AS NEEDED, Disp: 30 capsule, Rfl: 1   valsartan  (DIOVAN ) 80 MG tablet, Take 80 mg by mouth daily., Disp: , Rfl:   Current Facility-Administered Medications:    [START ON 07/01/2024] denosumab  (PROLIA ) injection 60 mg, 60 mg, Subcutaneous, Q6 months,  [2]  Allergies Allergen Reactions   Dilantin [Phenytoin Sodium Extended] Rash  [3]  Social History Tobacco Use  Smoking Status Former   Current packs/day: 0.00   Average packs/day: 1 pack/day for 32.9 years (32.9 ttl pk-yrs)   Types: Cigarettes   Start date: 81   Quit date: 04/02/2004   Years since quitting: 20.0   Passive exposure: Past  Smokeless Tobacco Never

## 2024-04-07 LAB — BMP8+EGFR
BUN/Creatinine Ratio: 18 (ref 12–28)
BUN: 20 mg/dL (ref 8–27)
CO2: 23 mmol/L (ref 20–29)
Calcium: 10 mg/dL (ref 8.7–10.3)
Chloride: 107 mmol/L — ABNORMAL HIGH (ref 96–106)
Creatinine, Ser: 1.13 mg/dL — ABNORMAL HIGH (ref 0.57–1.00)
Glucose: 95 mg/dL (ref 70–99)
Potassium: 4.4 mmol/L (ref 3.5–5.2)
Sodium: 143 mmol/L (ref 134–144)
eGFR: 52 mL/min/1.73 — ABNORMAL LOW (ref >59)

## 2024-04-07 LAB — LIPID PANEL
Chol/HDL Ratio: 3.5 ratio (ref 0.0–4.4)
Cholesterol, Total: 170 mg/dL (ref 100–199)
HDL: 49 mg/dL (ref >39)
LDL Chol Calc (NIH): 102 mg/dL — ABNORMAL HIGH (ref 0–99)
Triglycerides: 103 mg/dL (ref 0–149)
VLDL Cholesterol Cal: 19 mg/dL (ref 5–40)

## 2024-04-07 LAB — CBC
Hematocrit: 46.3 % (ref 34.0–46.6)
Hemoglobin: 14.6 g/dL (ref 11.1–15.9)
MCH: 32.3 pg (ref 26.6–33.0)
MCHC: 31.5 g/dL (ref 31.5–35.7)
MCV: 102 fL — ABNORMAL HIGH (ref 79–97)
Platelets: 256 x10E3/uL (ref 150–450)
RBC: 4.52 x10E6/uL (ref 3.77–5.28)
RDW: 12.2 % (ref 11.7–15.4)
WBC: 7.2 x10E3/uL (ref 3.4–10.8)

## 2024-04-07 LAB — MICROALBUMIN / CREATININE URINE RATIO
Creatinine, Urine: 85.7 mg/dL
Microalb/Creat Ratio: 38 mg/g{creat} — ABNORMAL HIGH (ref 0–29)
Microalbumin, Urine: 32.7 ug/mL

## 2024-04-07 LAB — VITAMIN B12: Vitamin B-12: 525 pg/mL (ref 232–1245)

## 2024-04-07 LAB — FOLATE: Folate: 4.5 ng/mL (ref >3.0)

## 2024-04-07 LAB — HEMOGLOBIN A1C
Est. average glucose Bld gHb Est-mCnc: 126 mg/dL
Hgb A1c MFr Bld: 6 % — ABNORMAL HIGH (ref 4.8–5.6)

## 2024-04-10 ENCOUNTER — Ambulatory Visit: Payer: Self-pay | Admitting: Internal Medicine

## 2024-04-11 ENCOUNTER — Other Ambulatory Visit: Payer: Self-pay | Admitting: Internal Medicine

## 2024-04-15 NOTE — Assessment & Plan Note (Signed)
 Chronic, LDL goal is less than 70.  Hypercholesterolemia managed with simvastatin . - Continue simvastatin . - Follow heart healthy lifestyle.

## 2024-04-15 NOTE — Assessment & Plan Note (Signed)
 Previous labs reviewed, her A1c has been elevated in the past. I will check an A1c today. Reminded to avoid refined sugars including sugary drinks/foods and processed meats including bacon, sausages and deli meats.

## 2024-04-15 NOTE — Assessment & Plan Note (Signed)
 Managed with temazepam  as needed, effective. - Continue temazepam  as needed. - Reminded to have a bedtime routine.

## 2024-04-15 NOTE — Assessment & Plan Note (Signed)
 Chronic, well controlled.  EKG performed, NSR w/ frequent PACs. Encouraged to stay well hydrated.  Chronic kidney disease stage 3a with hypertension. Blood pressure management ongoing with valsartan  and hydrochlorothiazide . Kidney function monitoring necessary. - Ordered urine test for protein to assess kidney function. - Continue valsartan  and hydrochlorothiazide .

## 2024-04-15 NOTE — Assessment & Plan Note (Signed)
 Chronic, she is encouraged to stay well hydrated, avoid NSAIDs and keep BP controlled to prevent progression of CKD.   - Nephrology input is appreciated - Most recent Nephrology notes reviewed

## 2024-04-15 NOTE — Assessment & Plan Note (Signed)
 BMI 37.  She is encouraged to strive for BMI less than 30 to decrease cardiac risk. Advised to aim for at least 150 minutes of exercise per week.

## 2024-04-15 NOTE — Assessment & Plan Note (Signed)
 Incidental finding on chest CT June 2022. Continue with statin therapy.  - Not currently on ASA, has h/o SAH due to cerebral aneurysm - LDL goal is less than 70.

## 2024-04-15 NOTE — Assessment & Plan Note (Addendum)
 A full exam was performed.  Importance of monthly self breast exams was discussed with the patient.  She is followed by Dr. Henry for her GYN care. She is advised to get 3045 minutes of regular exercise, no less than four to five days per week. Both weight-bearing and aerobic exercises are recommended.  She is advised to follow a healthy diet with at least six fruits/veggies per day, decrease intake of red meat and other saturated fats and to increase fish intake to twice weekly.  Meats/fish should not be fried -- baked, boiled or broiled is preferable. It is also important to cut back on your sugar intake.  Be sure to read labels - try to avoid anything with added sugar, high fructose corn syrup or other sweeteners.  If you must use a sweetener, you can try stevia or monkfruit.  It is also important to avoid artificially sweetened foods/beverages and diet drinks. Lastly, wear SPF 50 sunscreen on exposed skin and when in direct sunlight for an extended period of time.  Be sure to avoid fast food restaurants and aim for at least 60 ounces of water  daily.

## 2024-04-15 NOTE — Assessment & Plan Note (Signed)
 Managed with Synthroid . Thyroid  function monitored annually by specialist. - Continue Synthroid  75 mcg daily. - Continue annual thyroid  function monitoring with specialist.

## 2024-05-02 NOTE — Progress Notes (Signed)
 "  Name: LYDIA TOREN  MRN/ DOB: [id number], 09/18/1950    Age/ Sex: 74 y.o., female     PCP: Jarold Medici, MD   Reason for Endocrinology Evaluation: Hypothyroidism     Initial Endocrinology Clinic Visit: 08/16/2020    PATIENT IDENTIFIER: Ms. ZENOBIA KUENNEN is a 74 y.o., female with a past medical history of HTN and pulmonary fibrosis . She has followed with [facility] since 08/16/2020 for consultative assistance with management of her Hypothyroidism.   HISTORICAL SUMMARY:   She has been diagnosed with hypothyroidism years ago. Has been on Lt-4 ever since.    Sister with thyroid  disease  SUBJECTIVE:     Today (05/03/2024):  Ms. Axelson is here for hypothyroidism.  Weight remains stable No local neck swelling  No palpitations  No tremors  No constipation or diarrhea Has noted decrease energy  Has Temazepam  as needed for insomnia  No biotin intake   She does exercise 5x a week on treatmill    Synthroid  75 mcg daily     HISTORY:  Past Medical History:  Past Medical History:  Diagnosis Date   Allergy 04/12/2004   CKD (chronic kidney disease), stage III [facility])    nephrologist--- dr l. foster   GERD (gastroesophageal reflux disease)    Glaucoma, right eye    Hiatal hernia    History of gastric ulcer    per egd 2019 non-bleeding   History of spontaneous subarachnoid intracranial hemorrhage due to cerebral aneurysm 03/2004   due to A-com region bilobed aneursym  s/p craniotomy with clipping;  per pt no residual and no issues since   Hypertension    followed by pcp   Hypertensive nephropathy    Hypothyroidism    endocrinologist--- dr sam   IDA (iron deficiency anemia)    Insomnia    Mastodynia of left breast    OAB (overactive bladder)    Osteoporosis    Renal calculus, left    Wears glasses    Wears partial dentures    lower   Past Surgical History:  Past Surgical History:  Procedure Laterality Date   CEREBRAL ANEURYSM REPAIR   03/2004   @[facility]  by dr lauree   ;   craniotomy w/ clipping to A-com region for bilobed aneurysm (right skull base)   CLOSED REDUCTION NASAL FRACTURE N/A 06/21/2020   Procedure: CLOSED REDUCTION NASAL FRACTURE;  Surgeon: Llewellyn Gerard LABOR, DO;  Location: [facility];  Service: ENT;  Laterality: N/A;   COLONOSCOPY WITH ESOPHAGOGASTRODUODENOSCOPY (EGD)  03/16/2018   CYSTOSCOPY W/ URETERAL STENT PLACEMENT Left 11/13/2021   Procedure: CYSTOSCOPY WITH RETROGRADE PYELOGRAM/URETERAL STENT PLACEMENT;  Surgeon: Selma Donnice SAUNDERS, MD;  Location: [facility];  Service: Urology;  Laterality: Left;   CYSTOSCOPY/URETEROSCOPY/HOLMIUM LASER/STENT PLACEMENT Left 12/05/2021   Procedure: CYSTOSCOPY/ RETROGRADE/URETEROSCOPY/HOLMIUM LASER/STENT PLACEMENT;  Surgeon: Selma Donnice SAUNDERS, MD;  Location: [facility];  Service: Urology;  Laterality: Left;   LAPAROSCOPIC BILATERAL SALPINGO OOPHERECTOMY Bilateral 11/12/2015   Procedure: LAPAROSCOPIC BILATERAL SALPINGO OOPHORECTOMY;  Surgeon: Jon Rummer, MD;  Location: [facility];  Service: Gynecology;  Laterality: Bilateral;   LAPAROSCOPY N/A 11/22/2015   Procedure: LAPAROSCOPY DIAGNOSTIC with repair of serosal tear;  Surgeon: Jon Rummer, MD;  Location: [facility];  Service: Gynecology;  Laterality: N/A;   RADIOACTIVE SEED GUIDED EXCISIONAL BREAST BIOPSY Left 09/26/2015   Procedure: LEFT RADIOACTIVE SEED GUIDED EXCISIONAL BREAST BIOPSY;  Surgeon: Jina Nephew, MD;  [facility];  Service: General;  Laterality: Left;   RADIOACTIVE SEED GUIDED  EXCISIONAL BREAST BIOPSY Left 02/20/2020   Procedure: LEFT RADIOACTIVE SEED GUIDED EXCISIONAL BREAST BIOPSY;  Surgeon: Aron Shoulders, MD;  Location: [facility];  Service: General;  Laterality: Left;  RNFA   WISDOM TOOTH EXTRACTION     Social History:  reports that she quit smoking about 20 years ago. Her smoking use included cigarettes. She started smoking about 53 years ago. She has a 32.9 pack-year smoking history. She has  been exposed to tobacco smoke. She has never used smokeless tobacco. She reports that she does not drink alcohol and does not use drugs. Family History:  Family History  Problem Relation Age of Onset   Cancer Mother        Breast    Hypertension Mother    Cancer Father        Prostate   Hypertension Father    Cancer Sister        Breast   COPD Sister    Prostate cancer Brother    Healthy Son      HOME MEDICATIONS: Allergies as of 05/03/2024       Reactions   Dilantin [phenytoin Sodium Extended] Rash        Medication List        Accurate as of May 03, 2024 12:09 PM. If you have any questions, ask your nurse or doctor.          calcium carbonate 500 MG chewable tablet Commonly known as: TUMS - dosed in mg elemental calcium Chew 4 tablets by mouth 2 (two) times daily as needed for indigestion or heartburn.   cholecalciferol 25 MCG (1000 UNIT) tablet Commonly known as: VITAMIN D3 Take 1,000 Units by mouth daily.   Combigan 0.2-0.5 % ophthalmic solution Generic drug: brimonidine-timolol Place 1 drop into the right eye 2 (two) times daily.   famotidine  40 MG tablet Commonly known as: PEPCID  Take 40 mg by mouth 2 (two) times daily.   hydrochlorothiazide  12.5 MG capsule Commonly known as: MICROZIDE  TAKE 1 CAPSULE BY MOUTH EVERY OTHER DAY   IRON 27 PO Take 1 tablet by mouth at bedtime.   Magnesium 250 MG Tabs Take 250 mg by mouth at bedtime.   nystatin-triamcinolone cream Commonly known as: MYCOLOG II SMARTSIG:Topical Morning-Evening   Prolia  60 MG/ML Sosy injection Generic drug: denosumab  Inject 60 mg into the skin every 6 (six) months. Courier to rheum: [address], [place] [address]. Appt on 9/10//2025. CALL PT TO CONFIRM PAYMENT INFO   simvastatin  40 MG tablet Commonly known as: ZOCOR  TAKE 1 TABLET BY MOUTH EVERYDAY AT BEDTIME   solifenacin 10 MG tablet Commonly known as: VESICARE Take 10 mg by mouth daily.   Synthroid  75  MCG tablet Generic drug: levothyroxine  Take 1 tablet (75 mcg total) by mouth daily before breakfast.   temazepam  15 MG capsule Commonly known as: RESTORIL  TAKE 1 CAPSULE BY MOUTH EVERY DAY AT BEDTIME AS NEEDED   valsartan  80 MG tablet Commonly known as: DIOVAN  Take 80 mg by mouth daily.          OBJECTIVE:   PHYSICAL EXAM: VS: BP (!) 142/88   Pulse 83   Ht 5' 2 (1.575 m)   Wt 201 lb (91.2 kg)   SpO2 97%   BMI 36.76 kg/m    EXAM: General: Pt appears well and is in NAD  Neck: General: Supple without adenopathy. Thyroid : Thyroid  size normal.  No goiter or nodules appreciated.   Lungs: Clear with good BS bilat  Heart: Auscultation: RRR.  Extremities:  BL LE: No pretibial edema   Mental Status: Judgment, insight: Intact Orientation: Oriented to time, place, and person Memory: Intact for recent and remote events Mood and affect: No depression, anxiety, or agitation     DATA REVIEWED:  10/04/2023 TSH 1.60 uIU/mL   ASSESSMENT / PLAN / RECOMMENDATIONS:   Hypothyroidism:  -Patient is clinically euthyroid except for feeling more sluggish he recently -No local neck symptoms -I did advise the patient to start a multivitamin and to make sure it is separated from her Synthroid  by 4 hours - TFTs remains normal, no change   Medications   Continue Synthroid  75 mcg daily   2.  Prediabetes:  -A1c has been stable at 6.0% - The patient does exercise on a regular basis, but we did emphasize the importance of avoiding sugar sweetened beverages, and limiting carbohydrates to prevent the risk of progression to diabetes     F/U in 6 months    Signed electronically by: Stefano Redgie Butts, MD  [facility]  [facility] [address] [place] [place], [place] [address] Phone: [phone] FAX: [phone]      CC: Jarold Medici, MD [address] [place] [address] [place] [address] Phone: [phone]  Fax:  [phone]   Return to Endocrinology clinic as below: Future Appointments  Date Time Provider Department Center  05/03/2024 12:10 PM Philomene Haff, Donell Redgie, MD [facility] None  06/01/2024  1:00 PM Dolphus Reiter, MD [facility] None  06/14/2024  2:00 PM [facility]NUAL WELLNESS VISIT [facility] [id number]  10/05/2024  3:00 PM Jarold Medici, MD [facility] [address]  04/09/2025  3:00 PM Jarold Medici, MD [facility] [phone] [address]    "

## 2024-05-03 ENCOUNTER — Ambulatory Visit (INDEPENDENT_AMBULATORY_CARE_PROVIDER_SITE_OTHER): Admitting: Internal Medicine

## 2024-05-03 ENCOUNTER — Encounter: Payer: Self-pay | Admitting: Internal Medicine

## 2024-05-03 VITALS — BP 142/88 | HR 83 | Ht 62.0 in | Wt 201.0 lb

## 2024-05-03 DIAGNOSIS — E039 Hypothyroidism, unspecified: Secondary | ICD-10-CM | POA: Diagnosis not present

## 2024-05-03 DIAGNOSIS — R7303 Prediabetes: Secondary | ICD-10-CM

## 2024-05-03 NOTE — Patient Instructions (Signed)
 You may start multivitamin 50+ for women  Please take it at night, because you need to separate all vitamins and supplements from Synthroid  by at least 4 hours

## 2024-05-04 ENCOUNTER — Ambulatory Visit: Payer: Self-pay | Admitting: Internal Medicine

## 2024-05-04 LAB — TSH: TSH: 2.64 m[IU]/L (ref 0.40–4.50)

## 2024-05-04 LAB — T4, FREE: Free T4: 1.5 ng/dL (ref 0.8–1.8)

## 2024-05-04 MED ORDER — SYNTHROID 75 MCG PO TABS: 75.0000 ug | ORAL_TABLET | Freq: Every day | ORAL | 3 refills | Status: AC

## 2024-05-18 NOTE — Progress Notes (Unsigned)
 "  Office Visit Note  Patient: Rachel [name]             Date of Birth: January 15, 1951           MRN: [id number]             PCP: Jarold Medici, MD Referring: Jarold Medici, MD Visit Date: 06/01/2024 Occupation: Data Unavailable  Subjective:  No chief complaint on file.   History of Present Illness: Rachel [name] is a 74 y.o. female ***     Activities of Daily Living:  Patient reports morning stiffness for *** {minute/hour:19697}.   Patient {ACTIONS;DENIES/REPORTS:21021675::Denies} nocturnal pain.  Difficulty dressing/grooming: {ACTIONS;DENIES/REPORTS:21021675::Denies} Difficulty climbing stairs: {ACTIONS;DENIES/REPORTS:21021675::Denies} Difficulty getting out of chair: {ACTIONS;DENIES/REPORTS:21021675::Denies} Difficulty using hands for taps, buttons, cutlery, and/or writing: {ACTIONS;DENIES/REPORTS:21021675::Denies}  No Rheumatology ROS completed.   PMFS History:  Patient Active Problem List   Diagnosis Date Noted   Prediabetes 05/03/2024   Hypertensive heart and renal disease 04/06/2024   Pure hypercholesterolemia 04/06/2024   Frequent urination 03/27/2024   Acute cystitis with hematuria 03/27/2024   Aortic atherosclerosis 10/04/2023   Right hip pain 05/29/2023   Personal history of other colon polyps 04/17/2023   Encounter for annual physical exam 04/05/2023   Nonintractable headache 03/08/2023   SOB (shortness of breath) 03/08/2023   Class 1 obesity due to excess calories with serious comorbidity and body mass index (BMI) of 34.0 to 34.9 in adult 03/08/2023   Hair thinning 10/05/2022   Primary insomnia 10/12/2020   Osteopenia 10/12/2020   Stage 3a chronic kidney disease (HCC) 10/12/2020   Acquired hypothyroidism 08/16/2020   Class 2 severe obesity due to excess calories with serious comorbidity and body mass index (BMI) of 36.0 to 36.9 in adult 02/25/2018   Primary hypothyroidism 02/25/2018   Other abnormal glucose 02/25/2018   Hypertensive  nephropathy 02/25/2018   Chronic renal disease, stage II 02/25/2018   Postinflammatory pulmonary fibrosis (HCC) 11/09/2016   Morbid obesity due to excess calories (HCC) 12/17/2015   Small bowel obstruction (HCC) 11/19/2015   Nausea with vomiting 11/18/2015   S/P vaginal hysterectomy 11/12/2015   Essential hypertension 04/03/2013    Past Medical History:  Diagnosis Date   Allergy 04/12/2004   CKD (chronic kidney disease), stage III [facility])    nephrologist--- dr l. foster   GERD (gastroesophageal reflux disease)    Glaucoma, right eye    Hiatal hernia    History of gastric ulcer    per egd 2019 non-bleeding   History of spontaneous subarachnoid intracranial hemorrhage due to cerebral aneurysm 03/2004   due to A-com region bilobed aneursym  s/p craniotomy with clipping;  per pt no residual and no issues since   Hypertension    followed by pcp   Hypertensive nephropathy    Hypothyroidism    endocrinologist--- dr sam   IDA (iron deficiency anemia)    Insomnia    Mastodynia of left breast    OAB (overactive bladder)    Osteoporosis    Renal calculus, left    Wears glasses    Wears partial dentures    lower    Family History  Problem Relation Age of Onset   Cancer Mother        Breast    Hypertension Mother    Cancer Father        Prostate   Hypertension Father    Cancer Sister        Breast   COPD Sister    Prostate cancer Brother  Healthy Son    Past Surgical History:  Procedure Laterality Date   CEREBRAL ANEURYSM REPAIR  03/2004   @[facility]  by dr lauree   ;   craniotomy w/ clipping to A-com region for bilobed aneurysm (right skull base)   CLOSED REDUCTION NASAL FRACTURE N/A 06/21/2020   Procedure: CLOSED REDUCTION NASAL FRACTURE;  Surgeon: Llewellyn Gerard LABOR, DO;  Location: [facility];  Service: ENT;  Laterality: N/A;   COLONOSCOPY WITH ESOPHAGOGASTRODUODENOSCOPY (EGD)  03/16/2018   CYSTOSCOPY W/ URETERAL STENT PLACEMENT Left 11/13/2021   Procedure: CYSTOSCOPY WITH  RETROGRADE PYELOGRAM/URETERAL STENT PLACEMENT;  Surgeon: Selma Donnice SAUNDERS, MD;  Location: [facility];  Service: Urology;  Laterality: Left;   CYSTOSCOPY/URETEROSCOPY/HOLMIUM LASER/STENT PLACEMENT Left 12/05/2021   Procedure: CYSTOSCOPY/ RETROGRADE/URETEROSCOPY/HOLMIUM LASER/STENT PLACEMENT;  Surgeon: Selma Donnice SAUNDERS, MD;  Location: [facility];  Service: Urology;  Laterality: Left;   LAPAROSCOPIC BILATERAL SALPINGO OOPHERECTOMY Bilateral 11/12/2015   Procedure: LAPAROSCOPIC BILATERAL SALPINGO OOPHORECTOMY;  Surgeon: Jon Rummer, MD;  Location: [facility];  Service: Gynecology;  Laterality: Bilateral;   LAPAROSCOPY N/A 11/22/2015   Procedure: LAPAROSCOPY DIAGNOSTIC with repair of serosal tear;  Surgeon: Jon Rummer, MD;  Location: [facility];  Service: Gynecology;  Laterality: N/A;   RADIOACTIVE SEED GUIDED EXCISIONAL BREAST BIOPSY Left 09/26/2015   Procedure: LEFT RADIOACTIVE SEED GUIDED EXCISIONAL BREAST BIOPSY;  Surgeon: Jina Nephew, MD;  Location: [facility];  Service: General;  Laterality: Left;   RADIOACTIVE SEED GUIDED EXCISIONAL BREAST BIOPSY Left 02/20/2020   Procedure: LEFT RADIOACTIVE SEED GUIDED EXCISIONAL BREAST BIOPSY;  Surgeon: Nephew Jina, MD;  Location: [facility];  Service: General;  Laterality: Left;  RNFA   WISDOM TOOTH EXTRACTION     Social History[1] Social History   Social History Narrative   Not on file     Immunization History  Administered Date(s) Administered    sv, Bivalent, Protein Subunit Rsvpref,pf [place]) 06/02/2022   Fluad Quad(high Dose 65+) 03/06/2020, 03/26/2021, 04/02/2022, 01/06/2023   Fluad Trivalent(High Dose 65+) 02/09/2024   INFLUENZA, HIGH DOSE SEASONAL PF 02/25/2018, 03/01/2019   Influenza-Unspecified 01/25/2017   PFIZER Comirnaty(Gray Top)Covid-19 Tri-Sucrose Vaccine 10/02/2020   PFIZER(Purple Top)SARS-COV-2 Vaccination 06/03/2019, 06/28/2019, 01/30/2020, 10/02/2020, 09/01/2021   Pfizer Covid-19 Vaccine Bivalent Booster  85yrs & up 09/01/2021, 06/02/2022   Pfizer(Comirnaty)Fall Seasonal Vaccine 12 years and older 01/06/2023, 10/04/2023, 02/09/2024   Pneumococcal Conjugate-13 03/22/2013   Pneumococcal Polysaccharide-23 03/12/2020   Respiratory Syncytial Virus Vaccine,Recomb Aduvanted(Arexvy) 06/02/2022   Tdap 04/02/2022   Zoster Recombinant(Shingrix ) 01/27/2021, 09/01/2021, 01/18/2023     Objective: Vital Signs: There were no vitals taken for this visit.   Physical Exam   Musculoskeletal Exam: ***  CDAI Exam: CDAI Score: -- Patient Global: --; Provider Global: -- Swollen: --; Tender: -- Joint Exam 06/01/2024   No joint exam has been documented for this visit   There is currently no information documented on the homunculus. Go to the Rheumatology activity and complete the homunculus joint exam.  Investigation: No additional findings.  Imaging: No results found.  Recent Labs: Lab Results  Component Value Date   WBC 7.2 04/06/2024   HGB 14.6 04/06/2024   PLT 256 04/06/2024   NA 143 04/06/2024   K 4.4 04/06/2024   CL 107 (H) 04/06/2024   CO2 23 04/06/2024   GLUCOSE 95 04/06/2024   BUN 20 04/06/2024   CREATININE 1.13 (H) 04/06/2024   BILITOT 0.9 12/30/2023   ALKPHOS 52 10/04/2023   AST 16 12/30/2023   ALT 10 12/30/2023   PROT 7.4 12/30/2023  ALBUMIN 4.1 10/04/2023   CALCIUM 10.0 04/06/2024   GFRAA 44 (L) 08/29/2019    Speciality Comments: Prolia  started August 2017 by Dr. Jarold, patient received first Prolia  injection in August 2024 through our office.  Procedures:  No procedures performed Allergies: Dilantin [phenytoin sodium extended]   Assessment / Plan:     Visit Diagnoses: No diagnosis found.  Orders: No orders of the defined types were placed in this encounter.  No orders of the defined types were placed in this encounter.   Face-to-face time spent with patient was *** minutes. Greater than 50% of time was spent in counseling and coordination of  care.  Follow-Up Instructions: No follow-ups on file.   Daved JAYSON Gavel, CMA  Note - This record has been created using Animal nutritionist.  Chart creation errors have been sought, but may not always  have been located. Such creation errors do not reflect on  the standard of medical care.    [1]  Social History Tobacco Use   Smoking status: Former    Current packs/day: 0.00    Average packs/day: 1 pack/day for 32.9 years (32.9 ttl pk-yrs)    Types: Cigarettes    Start date: 36    Quit date: 04/02/2004    Years since quitting: 20.1    Passive exposure: Past   Smokeless tobacco: Never  Vaping Use   Vaping status: Never Used  Substance Use Topics   Alcohol use: No   Drug use: No   "

## 2024-06-01 ENCOUNTER — Ambulatory Visit: Payer: Medicare HMO | Admitting: Rheumatology

## 2024-06-01 DIAGNOSIS — E039 Hypothyroidism, unspecified: Secondary | ICD-10-CM

## 2024-06-01 DIAGNOSIS — F5101 Primary insomnia: Secondary | ICD-10-CM

## 2024-06-01 DIAGNOSIS — K56609 Unspecified intestinal obstruction, unspecified as to partial versus complete obstruction: Secondary | ICD-10-CM

## 2024-06-01 DIAGNOSIS — J8410 Pulmonary fibrosis, unspecified: Secondary | ICD-10-CM

## 2024-06-01 DIAGNOSIS — I10 Essential (primary) hypertension: Secondary | ICD-10-CM

## 2024-06-01 DIAGNOSIS — M7061 Trochanteric bursitis, right hip: Secondary | ICD-10-CM

## 2024-06-01 DIAGNOSIS — M8589 Other specified disorders of bone density and structure, multiple sites: Secondary | ICD-10-CM

## 2024-06-01 DIAGNOSIS — I129 Hypertensive chronic kidney disease with stage 1 through stage 4 chronic kidney disease, or unspecified chronic kidney disease: Secondary | ICD-10-CM

## 2024-06-01 DIAGNOSIS — Z5181 Encounter for therapeutic drug level monitoring: Secondary | ICD-10-CM

## 2024-06-01 DIAGNOSIS — E66812 Obesity, class 2: Secondary | ICD-10-CM

## 2024-06-01 DIAGNOSIS — N1831 Chronic kidney disease, stage 3a: Secondary | ICD-10-CM

## 2024-06-14 ENCOUNTER — Ambulatory Visit: Payer: Self-pay | Admitting: Internal Medicine

## 2024-06-14 ENCOUNTER — Ambulatory Visit: Payer: Medicare HMO

## 2024-10-05 ENCOUNTER — Ambulatory Visit: Payer: Self-pay | Admitting: Internal Medicine

## 2024-10-30 ENCOUNTER — Ambulatory Visit: Admitting: Internal Medicine

## 2025-04-09 ENCOUNTER — Encounter: Payer: Self-pay | Admitting: Internal Medicine
